# Patient Record
Sex: Female | Born: 1990 | Marital: Single | Smoking: Never smoker
Health system: Southern US, Community
[De-identification: ages and names within clinical notes are randomized; demographics above are authoritative.]

## PROBLEM LIST (undated history)

## (undated) ENCOUNTER — Inpatient Hospital Stay (HOSPITAL_COMMUNITY): Payer: Self-pay

## (undated) ENCOUNTER — Emergency Department (HOSPITAL_COMMUNITY): Admission: EM | Payer: Medicaid Other

## (undated) DIAGNOSIS — Z789 Other specified health status: Secondary | ICD-10-CM

## (undated) HISTORY — PX: NO PAST SURGERIES: SHX2092

---
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
## 2009-01-22 ENCOUNTER — Emergency Department (HOSPITAL_COMMUNITY): Admission: EM | Admit: 2009-01-22 | Discharge: 2009-01-22 | Payer: Self-pay | Admitting: Emergency Medicine

## 2009-07-24 ENCOUNTER — Emergency Department (HOSPITAL_COMMUNITY): Admission: EM | Admit: 2009-07-24 | Discharge: 2009-07-24 | Payer: Self-pay | Admitting: Emergency Medicine

## 2009-09-04 ENCOUNTER — Emergency Department (HOSPITAL_COMMUNITY): Admission: EM | Admit: 2009-09-04 | Discharge: 2009-09-04 | Payer: Self-pay | Admitting: Emergency Medicine

## 2009-11-11 ENCOUNTER — Ambulatory Visit (HOSPITAL_COMMUNITY): Admission: RE | Admit: 2009-11-11 | Discharge: 2009-11-11 | Payer: Self-pay | Admitting: Obstetrics

## 2010-01-28 ENCOUNTER — Inpatient Hospital Stay (HOSPITAL_COMMUNITY): Admission: AD | Admit: 2010-01-28 | Discharge: 2010-01-28 | Payer: Self-pay | Admitting: Obstetrics & Gynecology

## 2010-03-15 ENCOUNTER — Inpatient Hospital Stay (HOSPITAL_COMMUNITY): Admission: AD | Admit: 2010-03-15 | Discharge: 2010-03-15 | Payer: Self-pay | Admitting: Obstetrics

## 2010-03-17 ENCOUNTER — Inpatient Hospital Stay (HOSPITAL_COMMUNITY): Admission: AD | Admit: 2010-03-17 | Discharge: 2010-03-19 | Payer: Self-pay | Admitting: Obstetrics

## 2010-10-10 ENCOUNTER — Encounter: Payer: Self-pay | Admitting: Obstetrics

## 2010-12-05 LAB — CBC
HCT: 32.9 % — ABNORMAL LOW (ref 36.0–46.0)
Hemoglobin: 10.9 g/dL — ABNORMAL LOW (ref 12.0–15.0)
MCH: 28.7 pg (ref 26.0–34.0)
MCHC: 33 g/dL (ref 30.0–36.0)
MCHC: 34 g/dL (ref 30.0–36.0)
RDW: 13.5 % (ref 11.5–15.5)
RDW: 13.6 % (ref 11.5–15.5)

## 2010-12-07 LAB — CBC
MCHC: 34.2 g/dL (ref 30.0–36.0)
MCV: 86.2 fL (ref 78.0–100.0)
RBC: 3.59 MIL/uL — ABNORMAL LOW (ref 3.87–5.11)
RDW: 12.9 % (ref 11.5–15.5)

## 2010-12-07 LAB — URINALYSIS, ROUTINE W REFLEX MICROSCOPIC
Ketones, ur: NEGATIVE mg/dL
Nitrite: NEGATIVE
Specific Gravity, Urine: 1.015 (ref 1.005–1.030)
pH: 6.5 (ref 5.0–8.0)

## 2010-12-07 LAB — WET PREP, GENITAL
Clue Cells Wet Prep HPF POC: NONE SEEN
Yeast Wet Prep HPF POC: NONE SEEN

## 2010-12-07 LAB — GC/CHLAMYDIA PROBE AMP, GENITAL: GC Probe Amp, Genital: NEGATIVE

## 2010-12-22 LAB — URINE MICROSCOPIC-ADD ON

## 2010-12-22 LAB — WET PREP, GENITAL

## 2010-12-22 LAB — URINALYSIS, ROUTINE W REFLEX MICROSCOPIC
Ketones, ur: NEGATIVE mg/dL
Nitrite: POSITIVE — AB
Specific Gravity, Urine: 1.035 — ABNORMAL HIGH (ref 1.005–1.030)
Urobilinogen, UA: 1 mg/dL (ref 0.0–1.0)

## 2010-12-28 LAB — POCT URINALYSIS DIP (DEVICE)
Glucose, UA: NEGATIVE mg/dL
Ketones, ur: NEGATIVE mg/dL
Nitrite: NEGATIVE
Protein, ur: 30 mg/dL — AB
Specific Gravity, Urine: >=1.03 (ref 1.005–1.030)

## 2010-12-28 LAB — POCT PREGNANCY, URINE: Preg Test, Ur: NEGATIVE

## 2010-12-28 LAB — WET PREP, GENITAL: Trich, Wet Prep: NONE SEEN

## 2010-12-28 LAB — GC/CHLAMYDIA PROBE AMP, GENITAL: Chlamydia, DNA Probe: POSITIVE — AB

## 2010-12-30 LAB — GC/CHLAMYDIA PROBE AMP, GENITAL
Chlamydia, DNA Probe: POSITIVE — AB
GC Probe Amp, Genital: NEGATIVE

## 2011-01-02 ENCOUNTER — Emergency Department (HOSPITAL_COMMUNITY): Payer: Medicaid Other

## 2011-01-02 ENCOUNTER — Emergency Department (HOSPITAL_COMMUNITY)
Admission: EM | Admit: 2011-01-02 | Discharge: 2011-01-02 | Disposition: A | Discharge status: Home / Self Care | Payer: Medicaid Other | Attending: Emergency Medicine | Admitting: Emergency Medicine

## 2011-01-02 DIAGNOSIS — Y9229 Other specified public building as the place of occurrence of the external cause: Secondary | ICD-10-CM | POA: Insufficient documentation

## 2011-01-02 DIAGNOSIS — R55 Syncope and collapse: Secondary | ICD-10-CM | POA: Insufficient documentation

## 2011-01-02 DIAGNOSIS — M542 Cervicalgia: Secondary | ICD-10-CM | POA: Insufficient documentation

## 2011-01-03 LAB — POCT I-STAT, CHEM 8
Calcium, Ion: 1.27 mmol/L (ref 1.12–1.32)
Creatinine, Ser: 0.4 mg/dL (ref 0.4–1.2)
Glucose, Bld: 73 mg/dL (ref 70–99)
HCT: 36 % (ref 36.0–46.0)
Hemoglobin: 12.2 g/dL (ref 12.0–15.0)
Potassium: 4.3 mEq/L (ref 3.5–5.1)
TCO2: 25 mmol/L (ref 0–100)

## 2011-01-03 LAB — URINALYSIS, ROUTINE W REFLEX MICROSCOPIC
Bilirubin Urine: NEGATIVE
Hgb urine dipstick: NEGATIVE
Ketones, ur: NEGATIVE mg/dL
Nitrite: NEGATIVE
pH: 6 (ref 5.0–8.0)

## 2011-01-03 LAB — URINE CULTURE: Colony Count: 55000

## 2011-01-03 LAB — WET PREP, GENITAL
WBC, Wet Prep HPF POC: NONE SEEN
Yeast Wet Prep HPF POC: NONE SEEN

## 2011-01-03 LAB — CBC
HCT: 34.1 % — ABNORMAL LOW (ref 36.0–46.0)
Hemoglobin: 11.5 g/dL — ABNORMAL LOW (ref 12.0–15.0)
MCHC: 33.7 g/dL (ref 30.0–36.0)
RBC: 4.05 MIL/uL (ref 3.87–5.11)
RDW: 12.5 % (ref 11.5–15.5)

## 2011-01-03 LAB — DIFFERENTIAL
Basophils Absolute: 0 10*3/uL (ref 0.0–0.1)
Basophils Relative: 0 % (ref 0–1)
Eosinophils Relative: 2 % (ref 0–5)
Lymphocytes Relative: 40 % (ref 12–46)
Monocytes Absolute: 1.1 10*3/uL — ABNORMAL HIGH (ref 0.1–1.0)
Monocytes Relative: 14 % — ABNORMAL HIGH (ref 3–12)

## 2011-01-03 LAB — GC/CHLAMYDIA PROBE AMP, GENITAL
Chlamydia, DNA Probe: POSITIVE — AB
GC Probe Amp, Genital: NEGATIVE

## 2011-01-03 LAB — TYPE AND SCREEN
ABO/RH(D): O POS
Antibody Screen: NEGATIVE

## 2011-01-03 LAB — URINE MICROSCOPIC-ADD ON

## 2012-03-03 ENCOUNTER — Encounter (HOSPITAL_COMMUNITY): Payer: Self-pay | Admitting: Emergency Medicine

## 2012-03-03 ENCOUNTER — Emergency Department (HOSPITAL_COMMUNITY)
Admission: EM | Admit: 2012-03-03 | Discharge: 2012-03-03 | Disposition: A | Discharge status: Home / Self Care | Payer: BC Managed Care – PPO | Attending: Emergency Medicine | Admitting: Emergency Medicine

## 2012-03-03 DIAGNOSIS — Z880 Allergy status to penicillin: Secondary | ICD-10-CM | POA: Insufficient documentation

## 2012-03-03 DIAGNOSIS — R102 Pelvic and perineal pain: Secondary | ICD-10-CM

## 2012-03-03 DIAGNOSIS — N760 Acute vaginitis: Secondary | ICD-10-CM | POA: Insufficient documentation

## 2012-03-03 DIAGNOSIS — B9689 Other specified bacterial agents as the cause of diseases classified elsewhere: Secondary | ICD-10-CM | POA: Insufficient documentation

## 2012-03-03 DIAGNOSIS — A499 Bacterial infection, unspecified: Secondary | ICD-10-CM | POA: Insufficient documentation

## 2012-03-03 LAB — DIFFERENTIAL
Basophils Absolute: 0 10*3/uL (ref 0.0–0.1)
Eosinophils Absolute: 0.1 10*3/uL (ref 0.0–0.7)
Eosinophils Relative: 2 % (ref 0–5)
Lymphocytes Relative: 56 % — ABNORMAL HIGH (ref 12–46)
Lymphs Abs: 4.2 10*3/uL — ABNORMAL HIGH (ref 0.7–4.0)
Monocytes Absolute: 0.8 10*3/uL (ref 0.1–1.0)

## 2012-03-03 LAB — BASIC METABOLIC PANEL
CO2: 27 mEq/L (ref 19–32)
Calcium: 9.6 mg/dL (ref 8.4–10.5)
Creatinine, Ser: 0.68 mg/dL (ref 0.50–1.10)
GFR calc non Af Amer: >90 mL/min (ref >90)
Glucose, Bld: 94 mg/dL (ref 70–99)
Sodium: 138 mEq/L (ref 135–145)

## 2012-03-03 LAB — URINALYSIS, ROUTINE W REFLEX MICROSCOPIC
Bilirubin Urine: NEGATIVE
Glucose, UA: NEGATIVE mg/dL
Hgb urine dipstick: NEGATIVE
Ketones, ur: NEGATIVE mg/dL
Protein, ur: NEGATIVE mg/dL
Urobilinogen, UA: 1 mg/dL (ref 0.0–1.0)

## 2012-03-03 LAB — WET PREP, GENITAL

## 2012-03-03 LAB — CBC
HCT: 35.3 % — ABNORMAL LOW (ref 36.0–46.0)
MCH: 27.5 pg (ref 26.0–34.0)
MCV: 85.1 fL (ref 78.0–100.0)
Platelets: 291 10*3/uL (ref 150–400)
RDW: 12.2 % (ref 11.5–15.5)
WBC: 7.5 10*3/uL (ref 4.0–10.5)

## 2012-03-03 MED ORDER — TRAMADOL HCL 50 MG PO TABS: 50.0000 mg | ORAL_TABLET | Freq: Four times a day (QID) | ORAL | Status: AC | PRN

## 2012-03-03 MED ORDER — METRONIDAZOLE 500 MG PO TABS: 500.0000 mg | ORAL_TABLET | Freq: Two times a day (BID) | ORAL | Status: AC

## 2012-03-03 NOTE — Discharge Instructions (Signed)
Bacterial Vaginosis Bacterial vaginosis (BV) is a vaginal infection where the normal balance of bacteria in the vagina is disrupted. The normal balance is then replaced by an overgrowth of certain bacteria. There are several different kinds of bacteria that can cause BV. BV is the most common vaginal infection in women of childbearing age. CAUSES   The cause of BV is not fully understood. BV develops when there is an increase or imbalance of harmful bacteria.   Some activities or behaviors can upset the normal balance of bacteria in the vagina and put women at increased risk including:   Having a new sex partner or multiple sex partners.   Douching.   Using an intrauterine device (IUD) for contraception.   It is not clear what role sexual activity plays in the development of BV. However, women that have never had sexual intercourse are rarely infected with BV.  Women do not get BV from toilet seats, bedding, swimming pools or from touching objects around them.  SYMPTOMS   Grey vaginal discharge.   A fish-like odor with discharge, especially after sexual intercourse.   Itching or burning of the vagina and vulva.   Burning or pain with urination.   Some women have no signs or symptoms at all.  DIAGNOSIS  Your caregiver must examine the vagina for signs of BV. Your caregiver will perform lab tests and look at the sample of vaginal fluid through a microscope. They will look for bacteria and abnormal cells (clue cells), a pH test higher than 4.5, and a positive amine test all associated with BV.  RISKS AND COMPLICATIONS   Pelvic inflammatory disease (PID).   Infections following gynecology surgery.   Developing HIV.   Developing herpes virus.  TREATMENT  Sometimes BV will clear up without treatment. However, all women with symptoms of BV should be treated to avoid complications, especially if gynecology surgery is planned. Female partners generally do not need to be treated. However,  BV may spread between female sex partners so treatment is helpful in preventing a recurrence of BV.   BV may be treated with antibiotics. The antibiotics come in either pill or vaginal cream forms. Either can be used with nonpregnant or pregnant women, but the recommended dosages differ. These antibiotics are not harmful to the baby.   BV can recur after treatment. If this happens, a second round of antibiotics will often be prescribed.   Treatment is important for pregnant women. If not treated, BV can cause a premature delivery, especially for a pregnant woman who had a premature birth in the past. All pregnant women who have symptoms of BV should be checked and treated.   For chronic reoccurrence of BV, treatment with a type of prescribed gel vaginally twice a week is helpful.  HOME CARE INSTRUCTIONS   Finish all medication as directed by your caregiver.   Do not have sex until treatment is completed.   Tell your sexual partner that you have a vaginal infection. They should see their caregiver and be treated if they have problems, such as a mild rash or itching.   Practice safe sex. Use condoms. Only have 1 sex partner.  PREVENTION  Basic prevention steps can help reduce the risk of upsetting the natural balance of bacteria in the vagina and developing BV:  Do not have sexual intercourse (be abstinent).   Do not douche.   Use all of the medicine prescribed for treatment of BV, even if the signs and symptoms go away.     Tell your sex partner if you have BV. That way, they can be treated, if needed, to prevent reoccurrence.  SEEK MEDICAL CARE IF:   Your symptoms are not improving after 3 days of treatment.   You have increased discharge, pain, or fever.  MAKE SURE YOU:   Understand these instructions.   Will watch your condition.   Will get help right away if you are not doing well or get worse.  FOR MORE INFORMATION  Division of STD Prevention (DSTDP), Centers for Disease  Control and Prevention: www.cdc.gov/std American Social Health Association (ASHA): www.ashastd.org  Document Released: 09/05/2005 Document Revised: 08/25/2011 Document Reviewed: 02/26/2009 Exit[employer] Patient Information 2012 [employer]. 

## 2012-03-03 NOTE — ED Provider Notes (Signed)
History     CSN: [id number]  Arrival date & time 03/03/12  0125   First MD Initiated Contact with Patient 03/03/12 0348      Chief Complaint  Patient presents with  . Abdominal Pain    (Consider location/radiation/quality/duration/timing/severity/associated sxs/prior treatment) HPI Comments: The patient states she was having sex with her significant other when she started with discomfort in the lower abdomen, like "something was coming out of her".  She denies vaginal bleeding or discharge.  No fevers or chills.  Never happened before.  No urinary complaints.  The history is provided by the patient.    History reviewed. No pertinent past medical history.  History reviewed. No pertinent past surgical history.  No family history on file.  History  Substance Use Topics  . Smoking status: Never Smoker   . Smokeless tobacco: Not on file  . Alcohol Use: No    OB History    Grav Para Term Preterm Abortions TAB SAB Ect Mult Living                  Review of Systems  All other systems reviewed and are negative.    Allergies  Penicillins  Home Medications  No current outpatient prescriptions on file.  BP 114/45  Pulse 64  Temp 98 F (36.7 C) (Oral)  Resp 18  SpO2 98%  LMP 02/19/2012  Physical Exam  Nursing note and vitals reviewed. Constitutional: She is oriented to person, place, and time. She appears well-developed and well-nourished. No distress.  HENT:  Head: Normocephalic and atraumatic.  Neck: Normal range of motion. Neck supple.  Cardiovascular: Normal rate and regular rhythm.  Exam reveals no gallop and no friction rub.   No murmur heard. Pulmonary/Chest: Effort normal and breath sounds normal. No respiratory distress. She has no wheezes.  Abdominal: Soft. Bowel sounds are normal. She exhibits no distension. There is no tenderness.  Genitourinary: Uterus normal. Vaginal discharge found.       Slight gray vaginal discharge.  Musculoskeletal: Normal  range of motion.  Neurological: She is alert and oriented to person, place, and time.  Skin: Skin is warm and dry. She is not diaphoretic.    ED Course  Procedures (including critical care time)  Labs Reviewed  URINALYSIS, ROUTINE W REFLEX MICROSCOPIC - Abnormal; Notable for the following:    APPearance CLOUDY (*)     Leukocytes, UA SMALL (*)     All other components within normal limits  CBC - Abnormal; Notable for the following:    Hemoglobin 11.4 (*)     HCT 35.3 (*)     All other components within normal limits  DIFFERENTIAL - Abnormal; Notable for the following:    Neutrophils Relative 31 (*)     Lymphocytes Relative 56 (*)     Lymphs Abs 4.2 (*)     All other components within normal limits  URINE MICROSCOPIC-ADD ON - Abnormal; Notable for the following:    Squamous Epithelial / LPF FEW (*)     Bacteria, UA MANY (*)     All other components within normal limits  BASIC METABOLIC PANEL  POCT PREGNANCY, URINE   No results found.   No diagnosis found.    MDM  Wet prep shows many clue cells.  Will treat as BV with pain meds, flagyl.  No evidence of trauma in the vagina.          Geoffery Lyons, MD 03/03/12 [phone]

## 2012-03-03 NOTE — ED Notes (Signed)
PT. REPORTS RIGHT LOWER ABDOMINAL PAIN ONSET YESTERDAY WHILE HAVING SEXUAL INTERCOURSE WITH PARTNER , DENIES NAUSEA, VOMITTING OR DIARRHEA . NO FEVER /CHILLS OR VAGINAL DISCHARGE.

## 2012-05-21 IMAGING — CR DG NECK SOFT TISSUE
1 series · 1 of 1 positions shown · non-contrast
Comparison: None

CLINICAL DATA: Sore throat and pain while swallowing

NECK SOFT TISSUES - 1+ VIEW

[w soft tissue neck]
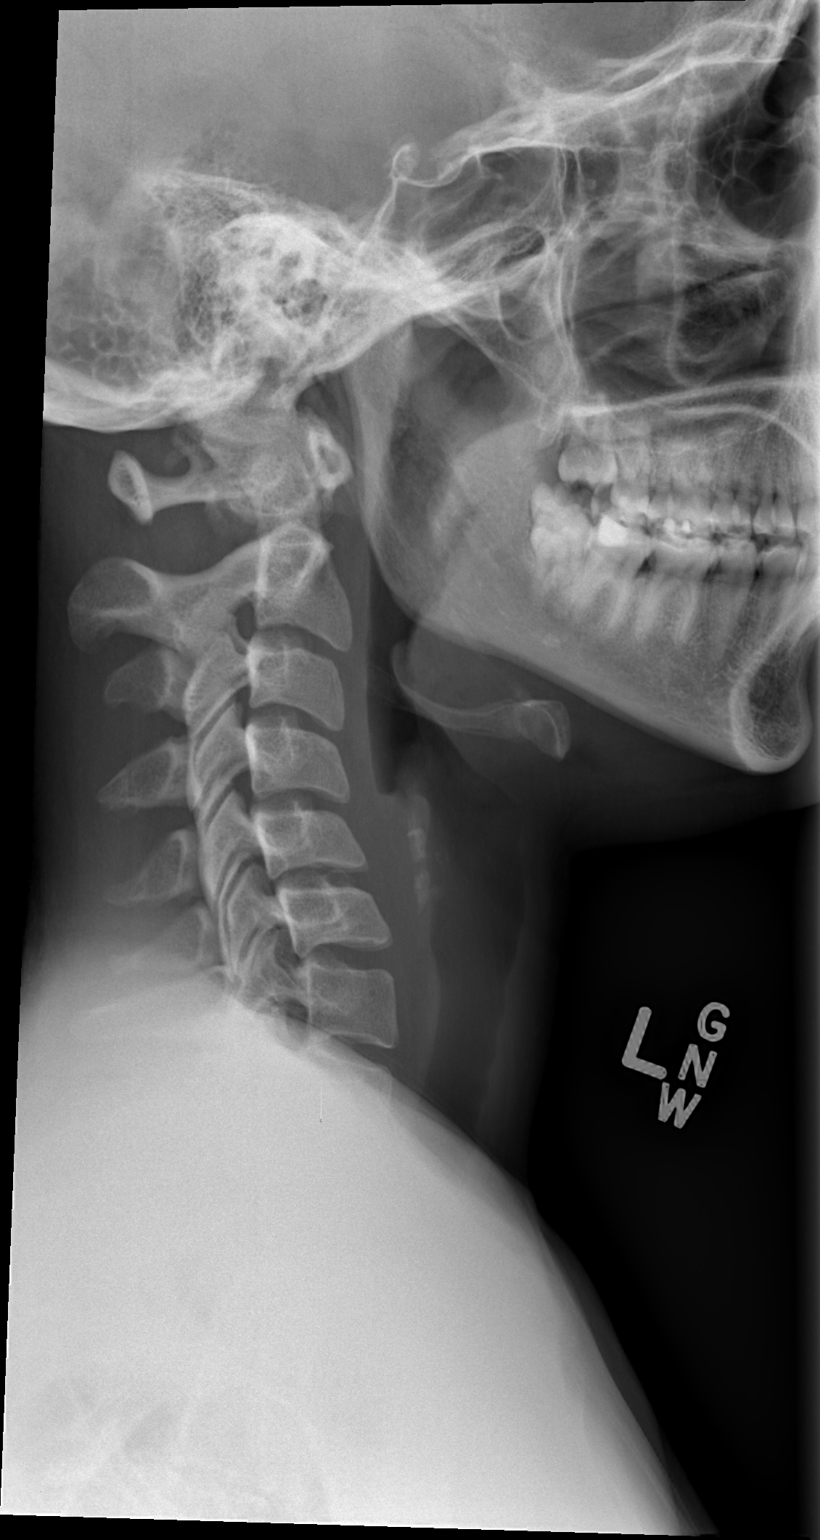

[1 of 1 positions shown; findings below may reference images not displayed]

FINDINGS: Prevertebral soft tissue space is normal.

No soft tissue abnormalities noted.

No radio-opaque foreign bodies identified.
IMPRESSION: 1.  Negative exam.

## 2012-10-16 ENCOUNTER — Encounter (HOSPITAL_COMMUNITY): Payer: Self-pay | Admitting: Adult Health

## 2012-10-16 ENCOUNTER — Emergency Department (HOSPITAL_COMMUNITY): Payer: BC Managed Care – PPO

## 2012-10-16 ENCOUNTER — Emergency Department (HOSPITAL_COMMUNITY)
Admission: EM | Admit: 2012-10-16 | Discharge: 2012-10-16 | Disposition: A | Discharge status: Home / Self Care | Payer: BC Managed Care – PPO | Attending: Emergency Medicine | Admitting: Emergency Medicine

## 2012-10-16 DIAGNOSIS — Z3202 Encounter for pregnancy test, result negative: Secondary | ICD-10-CM | POA: Insufficient documentation

## 2012-10-16 DIAGNOSIS — R52 Pain, unspecified: Secondary | ICD-10-CM | POA: Insufficient documentation

## 2012-10-16 DIAGNOSIS — R05 Cough: Secondary | ICD-10-CM

## 2012-10-16 DIAGNOSIS — N898 Other specified noninflammatory disorders of vagina: Secondary | ICD-10-CM | POA: Insufficient documentation

## 2012-10-16 DIAGNOSIS — J029 Acute pharyngitis, unspecified: Secondary | ICD-10-CM | POA: Insufficient documentation

## 2012-10-16 DIAGNOSIS — J069 Acute upper respiratory infection, unspecified: Secondary | ICD-10-CM | POA: Insufficient documentation

## 2012-10-16 DIAGNOSIS — R059 Cough, unspecified: Secondary | ICD-10-CM

## 2012-10-16 LAB — URINALYSIS, ROUTINE W REFLEX MICROSCOPIC
Bilirubin Urine: NEGATIVE
Glucose, UA: NEGATIVE mg/dL
Hgb urine dipstick: NEGATIVE
Ketones, ur: NEGATIVE mg/dL
Leukocytes, UA: NEGATIVE
Nitrite: NEGATIVE
Protein, ur: NEGATIVE mg/dL
Specific Gravity, Urine: 1.022 (ref 1.005–1.030)
Urobilinogen, UA: 2 mg/dL — ABNORMAL HIGH (ref 0.0–1.0)
pH: 6 (ref 5.0–8.0)

## 2012-10-16 LAB — WET PREP, GENITAL
Trich, Wet Prep: NONE SEEN
Yeast Wet Prep HPF POC: NONE SEEN

## 2012-10-16 LAB — POCT PREGNANCY, URINE: Preg Test, Ur: NEGATIVE

## 2012-10-16 MED ORDER — HYDROCOD POLST-CHLORPHEN POLST 10-8 MG/5ML PO LQCR
5.0000 mL | Freq: Once | ORAL | Status: AC
  Administered 2012-10-16: 5 mL via ORAL
  Filled 2012-10-16: qty 5

## 2012-10-16 MED ORDER — HYDROCOD POLST-CHLORPHEN POLST 10-8 MG/5ML PO LQCR: 5.0000 mL | Freq: Two times a day (BID) | ORAL | Status: DC | PRN

## 2012-10-16 NOTE — ED Notes (Signed)
Patient currently sitting up in bed; no respiratory or acute distress noted.  Patient updated on plan of care; informed patient that we are currently waiting on EDP to come and perform pelvic exam; patient denies needs at this time; will continue to monitor.

## 2012-10-16 NOTE — ED Notes (Signed)
Patient given discharge paperwork; went over discharge instructions with patient.  Patient instructed to take prescriptions as directed and to not drive while taking narcotics.  Patient instructed to follow up with primary care physician and to return to the ED for new, worsening, or concerning symptoms.

## 2012-10-16 NOTE — ED Notes (Addendum)
Patient currently resting quietly in bed; no respiratory or acute distress noted.  Patient updated on plan of care; informed patient that we are currently waiting on EDP to perform pelvic exam; patient given warm blankets.  Patient denies any other needs at this time; will continue to monitor.

## 2012-10-16 NOTE — ED Notes (Signed)
Pt presents with 2 weeks of cough and 2 days of generalized body aches and sore throat. She also would like a an STD check due to discharge and foul vaginal odor for one week.

## 2012-10-16 NOTE — ED Notes (Signed)
Pelvic cart set up at bedside  

## 2012-10-16 NOTE — ED Provider Notes (Signed)
History     CSN: [id number]  Arrival date & time 10/16/12  0028   First MD Initiated Contact with Patient 10/16/12 [phone]      Chief Complaint  Patient presents with  . Cough    (Consider location/radiation/quality/duration/timing/severity/associated sxs/prior treatment) HPI 22 year old female presents to emergency department complaining of 2 weeks of persistent cough, 2 days of sore throat and generalized body aches. Patient has not taken any over-the-counter medications. She's had no fevers. She's not a smoker. She has no sick contacts. Patient also complaining of 2 weeks of vaginal discharge with odor. Patient usually uses condoms, but not every time. She denies any new sexual partners. She denies any pelvic pain. No urinary symptoms. Her last menstrual cycle was 2 weeks ago.  History reviewed. No pertinent past medical history.  History reviewed. No pertinent past surgical history.  History reviewed. No pertinent family history.  History  Substance Use Topics  . Smoking status: Never Smoker   . Smokeless tobacco: Not on file  . Alcohol Use: No    OB History    Grav Para Term Preterm Abortions TAB SAB Ect Mult Living                  Review of Systems  See History of Present Illness; otherwise all other systems are reviewed and negative Allergies  Penicillins  Home Medications   Current Outpatient Rx  Name  Route  Sig  Dispense  Refill  . HYDROCOD POLST-CPM POLST ER 10-8 MG/5ML PO LQCR   Oral   Take 5 mLs by mouth every 12 (twelve) hours as needed (cough).   140 mL   0     BP 111/69  Pulse 92  Temp 98 F (36.7 C) (Oral)  Resp 20  SpO2 100%  LMP 09/26/2012  Physical Exam  Nursing note and vitals reviewed. Constitutional: She is oriented to person, place, and time. She appears well-developed and well-nourished.  HENT:  Head: Normocephalic and atraumatic.  Right Ear: External ear normal.  Left Ear: External ear normal.  Mouth/Throat: Oropharynx is  clear and moist.       Posterior pharynx erythematous but no exudate. If those are not enlarged. She has rhinorrhea. She has dry cough noted  Eyes: Conjunctivae normal and EOM are normal. Pupils are equal, round, and reactive to light.  Neck: Normal range of motion. Neck supple. No JVD present. No tracheal deviation present. No thyromegaly present.  Cardiovascular: Normal rate, regular rhythm, normal heart sounds and intact distal pulses.  Exam reveals no gallop and no friction rub.   No murmur heard. Pulmonary/Chest: Effort normal and breath sounds normal. No stridor. No respiratory distress. She has no wheezes. She has no rales. She exhibits no tenderness.       Cough  Abdominal: Soft. Bowel sounds are normal. She exhibits no distension and no mass. There is no tenderness. There is no rebound and no guarding.  Genitourinary:       External genitalia normal Vagina with scant white/clear discharge Cervix closed  no lesions No cervical motion tenderness Adnexa palpated, no masses or tenderness noted Bladder palpated no tenderness Uterus palpated no masses or tenderness    Musculoskeletal: Normal range of motion. She exhibits no edema and no tenderness.  Lymphadenopathy:    She has no cervical adenopathy.  Neurological: She is oriented to person, place, and time. She has normal reflexes. No cranial nerve deficit. She exhibits normal muscle tone. Coordination normal.  Skin: Skin is warm and  dry. No rash noted. No erythema. No pallor.  Psychiatric: She has a normal mood and affect. Her behavior is normal. Judgment and thought content normal.    ED Course  Procedures (including critical care time)  Labs Reviewed  URINALYSIS, ROUTINE W REFLEX MICROSCOPIC - Abnormal; Notable for the following:    APPearance HAZY (*)     Urobilinogen, UA 2.0 (*)     All other components within normal limits  WET PREP, GENITAL - Abnormal; Notable for the following:    Clue Cells Wet Prep HPF POC FEW (*)       WBC, Wet Prep HPF POC FEW (*)     All other components within normal limits  POCT PREGNANCY, URINE  GC/CHLAMYDIA PROBE AMP   Dg Chest 2 View  10/16/2012  *RADIOLOGY REPORT*  Clinical Data: Cough, chest pain.  CHEST - 2 VIEW  Comparison: None.  Findings: Lungs are clear. No pleural effusion or pneumothorax. The cardiomediastinal contours are within normal limits. The visualized bones and soft tissues are without significant appreciable abnormality.  IMPRESSION: No radiographic evidence of acute cardiopulmonary process.   Original Report Authenticated By: Jearld Lesch, M.D.      1. Cough   2. Upper respiratory infection       MDM   22 year old female with URI symptoms as well as vaginal discharge. GC and Chlamydia sent off, but she has no signs of PID, and no overt discharge. No treatment for STDs at this time. Will await lab samples. Supportive care for her URI symptoms.        Olivia Mackie, MD 10/16/12 [phone]

## 2012-10-16 NOTE — ED Notes (Signed)
Patient currently resting quietly in bed; no respiratory or acute distress noted.  Patient updated on plan of care; informed patient that we are currently waiting on discharge paperwork from EDP.  Patient denies any other needs at this time; will continue to monitor.

## 2012-10-16 NOTE — ED Notes (Signed)
Patient complaining of cough that started two days ago; cough progressively became worse two to three days ago, along with additional symptoms beginning -- sore throat, chills, generalized body aches, and head pressure.  Patient requesitng pelvic exam; complaining of foul smelling abnormal vaginal discharge that has been going on for one week.  Patient alert and oriented x4; PERRL present.  Upon arrival to room, patient given mask to wear; placed on droplet precautions.  Will continue to monitor.

## 2013-01-23 ENCOUNTER — Telehealth: Payer: Self-pay | Admitting: *Deleted

## 2013-01-23 MED ORDER — METRONIDAZOLE 500 MG PO TABS: 500.0000 mg | ORAL_TABLET | Freq: Two times a day (BID) | ORAL | Status: DC

## 2013-01-24 NOTE — Telephone Encounter (Signed)
Patient calling regarding abnormal discharge with an odor for one week.  Metronidazole called to her pharmacy - [facility]/ [address] and [address].

## 2013-03-07 ENCOUNTER — Telehealth: Payer: Self-pay | Admitting: *Deleted

## 2013-03-07 NOTE — Telephone Encounter (Signed)
Pt left message requesting refill on Ibuprofen 800 mg.   I called pt and left message to return call to office.

## 2013-03-18 ENCOUNTER — Telehealth: Payer: Self-pay | Admitting: *Deleted

## 2013-03-18 NOTE — Telephone Encounter (Signed)
Pt left message requesting refill on Ibuprofen.   5:55 PM Spoke with pt, who advised only takes Ibuprofen 800 mg for menstrual cramping. Pt is requesting refill on same.

## 2013-03-19 ENCOUNTER — Ambulatory Visit: Payer: Self-pay | Admitting: Obstetrics

## 2013-03-19 MED ORDER — IBUPROFEN 800 MG PO TABS: 800.0000 mg | ORAL_TABLET | Freq: Three times a day (TID) | ORAL | Status: DC | PRN

## 2013-03-19 NOTE — Telephone Encounter (Signed)
Per Dr. Clearance Coots- Ibuprofen 800 mg every 8 hours as needed, #30 x 1 year. Sent prescription electronically and pt aware.

## 2013-04-18 ENCOUNTER — Other Ambulatory Visit: Payer: Self-pay | Admitting: *Deleted

## 2013-04-18 MED ORDER — FLUCONAZOLE 150 MG PO TABS: 150.0000 mg | ORAL_TABLET | Freq: Once | ORAL | Status: DC

## 2013-04-30 ENCOUNTER — Encounter: Payer: Self-pay | Admitting: Obstetrics

## 2013-07-03 ENCOUNTER — Ambulatory Visit (INDEPENDENT_AMBULATORY_CARE_PROVIDER_SITE_OTHER): Payer: BC Managed Care – PPO | Admitting: Obstetrics

## 2013-07-03 ENCOUNTER — Encounter: Payer: Self-pay | Admitting: Obstetrics

## 2013-07-03 VITALS — BP 123/71 | HR 62 | Temp 98.9°F | Ht 65.0 in | Wt 132.2 lb

## 2013-07-03 DIAGNOSIS — Z01419 Encounter for gynecological examination (general) (routine) without abnormal findings: Secondary | ICD-10-CM

## 2013-07-03 DIAGNOSIS — Z113 Encounter for screening for infections with a predominantly sexual mode of transmission: Secondary | ICD-10-CM

## 2013-07-03 DIAGNOSIS — N760 Acute vaginitis: Secondary | ICD-10-CM

## 2013-07-03 DIAGNOSIS — Z0000 Encounter for general adult medical examination without abnormal findings: Secondary | ICD-10-CM

## 2013-07-03 LAB — SYPHILIS: RPR W/REFLEX TO RPR TITER AND TREPONEMAL ANTIBODIES, TRADITIONAL SCREENING AND DIAGNOSIS ALGORITHM

## 2013-07-03 LAB — HIV ANTIBODY (ROUTINE TESTING W REFLEX): HIV: NONREACTIVE

## 2013-07-03 LAB — HEPATITIS C ANTIBODY: HCV Ab: NEGATIVE

## 2013-07-03 NOTE — Progress Notes (Signed)
.   Subjective:     Tammie Martinez is a 22 y.o. female here for a routine exam.  Current complaints: Pt would also like to be tested for all StD's.  Personal health questionnaire reviewed: yes.   Gynecologic History Patient's last menstrual period was 05/31/2013. Contraception: none Last Pap: 2011. Results were: normal Last mammogram: N/A  Obstetric History OB History  No data available     The following portions of the patient's history were reviewed and updated as appropriate: allergies, current medications, past family history, past medical history, past social history, past surgical history and problem list.  Review of Systems Pertinent items are noted in HPI.    Objective:    General appearance: alert and no distress Abdomen: normal findings: soft, non-tender Pelvic: cervix normal in appearance, external genitalia normal, no adnexal masses or tenderness, no cervical motion tenderness, uterus normal size, shape, and consistency and vagina normal without discharge     Assessment:    Healthy female exam.    Plan:    Education reviewed: safe sex/STD prevention and contraceptive options. Contraception: none. Follow up in: several months.

## 2013-07-04 LAB — WET PREP BY MOLECULAR PROBE
Candida species: NEGATIVE
Gardnerella vaginalis: POSITIVE — AB
Trichomonas vaginosis: NEGATIVE

## 2013-07-04 LAB — PAP IG W/ RFLX HPV ASCU

## 2013-07-12 ENCOUNTER — Telehealth: Payer: Self-pay | Admitting: *Deleted

## 2013-07-12 MED ORDER — METRONIDAZOLE 500 MG PO TABS: 500.0000 mg | ORAL_TABLET | Freq: Two times a day (BID) | ORAL | Status: DC

## 2013-07-12 NOTE — Telephone Encounter (Signed)
Message copied by Glendell Docker on Fri Jul 12, 2013 12:49 PM ------      Message from: Coral Ceo A      Created: Thu Jul 04, 2013  6:57 AM       Flagyl 500mg  po bid x 7 days. ------

## 2013-07-12 NOTE — Telephone Encounter (Signed)
Call placed to patient she was informed of lab results and medication per Dr Coral Ceo instructions. Patient has verbalized understanding and agrees as instructed. Rx sent to pharmacy.

## 2013-07-25 ENCOUNTER — Other Ambulatory Visit: Payer: Self-pay | Admitting: *Deleted

## 2013-07-25 DIAGNOSIS — B379 Candidiasis, unspecified: Secondary | ICD-10-CM

## 2013-07-25 MED ORDER — FLUCONAZOLE 150 MG PO TABS: 150.0000 mg | ORAL_TABLET | Freq: Once | ORAL | Status: DC

## 2013-07-31 ENCOUNTER — Encounter: Payer: Self-pay | Admitting: Obstetrics

## 2013-07-31 ENCOUNTER — Emergency Department (HOSPITAL_COMMUNITY)
Admission: EM | Admit: 2013-07-31 | Discharge: 2013-07-31 | Disposition: A | Discharge status: Home / Self Care | Payer: BC Managed Care – PPO | Attending: Emergency Medicine | Admitting: Emergency Medicine

## 2013-07-31 ENCOUNTER — Encounter (HOSPITAL_COMMUNITY): Payer: Self-pay | Admitting: Emergency Medicine

## 2013-07-31 DIAGNOSIS — M545 Low back pain, unspecified: Secondary | ICD-10-CM

## 2013-07-31 DIAGNOSIS — Y9389 Activity, other specified: Secondary | ICD-10-CM | POA: Insufficient documentation

## 2013-07-31 DIAGNOSIS — S3981XA Other specified injuries of abdomen, initial encounter: Secondary | ICD-10-CM | POA: Insufficient documentation

## 2013-07-31 DIAGNOSIS — IMO0002 Reserved for concepts with insufficient information to code with codable children: Secondary | ICD-10-CM | POA: Insufficient documentation

## 2013-07-31 DIAGNOSIS — Y92410 Unspecified street and highway as the place of occurrence of the external cause: Secondary | ICD-10-CM | POA: Insufficient documentation

## 2013-07-31 DIAGNOSIS — Z880 Allergy status to penicillin: Secondary | ICD-10-CM | POA: Insufficient documentation

## 2013-07-31 DIAGNOSIS — R109 Unspecified abdominal pain: Secondary | ICD-10-CM

## 2013-07-31 DIAGNOSIS — S0993XA Unspecified injury of face, initial encounter: Secondary | ICD-10-CM | POA: Insufficient documentation

## 2013-07-31 MED ORDER — TRAMADOL HCL 50 MG PO TABS: 50.0000 mg | ORAL_TABLET | Freq: Four times a day (QID) | ORAL | Status: DC | PRN

## 2013-07-31 MED ORDER — HYDROCODONE-ACETAMINOPHEN 5-325 MG PO TABS
2.0000 | ORAL_TABLET | Freq: Once | ORAL | Status: AC
  Administered 2013-07-31: 2 via ORAL
  Filled 2013-07-31: qty 2

## 2013-07-31 MED ORDER — HYDROCODONE-ACETAMINOPHEN 5-325 MG PO TABS: 1.0000 | ORAL_TABLET | Freq: Four times a day (QID) | ORAL | Status: DC | PRN
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
## 2013-07-31 NOTE — Progress Notes (Signed)
[facility] provided pt with a list of primary care resources.  °

## 2013-07-31 NOTE — ED Provider Notes (Signed)
Medical screening examination/treatment/procedure(s) were performed by non-physician practitioner and as supervising physician I was immediately available for consultation/collaboration.  EKG Interpretation   None        Donnetta Hutching, MD 07/31/13 1429
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
## 2013-09-18 ENCOUNTER — Telehealth: Payer: Self-pay | Admitting: *Deleted

## 2013-09-18 ENCOUNTER — Other Ambulatory Visit: Payer: Self-pay | Admitting: *Deleted

## 2013-09-18 DIAGNOSIS — B379 Candidiasis, unspecified: Secondary | ICD-10-CM

## 2013-09-18 MED ORDER — FLUCONAZOLE 150 MG PO TABS: 150.0000 mg | ORAL_TABLET | Freq: Once | ORAL | Status: DC

## 2013-09-18 NOTE — Telephone Encounter (Signed)
Diflucan 150mg  x 1tab sent to pharmacy. Pt made aware.

## 2013-09-20 ENCOUNTER — Encounter: Payer: Self-pay | Admitting: Obstetrics

## 2013-09-24 ENCOUNTER — Telehealth: Payer: Self-pay | Admitting: *Deleted

## 2013-09-24 NOTE — Telephone Encounter (Signed)
Patient believes she has a bacterial infection. Requesting prescription.  Subscriber unavailable.

## 2013-09-26 MED ORDER — METRONIDAZOLE 500 MG PO TABS: 500.0000 mg | ORAL_TABLET | Freq: Two times a day (BID) | ORAL | Status: DC

## 2013-09-26 NOTE — Telephone Encounter (Signed)
Patient states she is having a white milky discharge with odor. Pt denies any itching, irritation or odor. Per nursing protocol Metronidazole sent to the pharmacy. Patient advised to call back to make an appointment if the medication does not clear the issue. Patient verbalized understanding.

## 2013-10-29 ENCOUNTER — Inpatient Hospital Stay (HOSPITAL_COMMUNITY)
Admission: AD | Admit: 2013-10-29 | Discharge: 2013-10-29 | Disposition: A | Discharge status: Home / Self Care | Payer: BC Managed Care – PPO | Source: Ambulatory Visit | Attending: Obstetrics & Gynecology | Admitting: Obstetrics & Gynecology

## 2013-10-29 ENCOUNTER — Ambulatory Visit: Payer: BC Managed Care – PPO | Admitting: Obstetrics

## 2013-10-29 DIAGNOSIS — N898 Other specified noninflammatory disorders of vagina: Secondary | ICD-10-CM | POA: Insufficient documentation

## 2013-10-29 DIAGNOSIS — R109 Unspecified abdominal pain: Secondary | ICD-10-CM | POA: Insufficient documentation

## 2013-10-29 DIAGNOSIS — N949 Unspecified condition associated with female genital organs and menstrual cycle: Secondary | ICD-10-CM | POA: Insufficient documentation

## 2013-10-29 LAB — URINALYSIS, ROUTINE W REFLEX MICROSCOPIC
BILIRUBIN URINE: NEGATIVE
Glucose, UA: NEGATIVE mg/dL
HGB URINE DIPSTICK: NEGATIVE
Ketones, ur: NEGATIVE mg/dL
Nitrite: NEGATIVE
PROTEIN: NEGATIVE mg/dL
UROBILINOGEN UA: 2 mg/dL — AB (ref 0.0–1.0)
pH: 6 (ref 5.0–8.0)

## 2013-10-29 LAB — URINE MICROSCOPIC-ADD ON

## 2013-10-29 LAB — POCT PREGNANCY, URINE: Preg Test, Ur: NEGATIVE

## 2013-10-29 NOTE — MAU Note (Signed)
Pt called from waiting area to be brought back to room 3.  Pt left without seeing practioner or signing AMA

## 2013-10-29 NOTE — MAU Note (Signed)
Patient states she has had a white vaginal discharge with an odor for about one week. Started having abdominal pain yesterday. Denies nausea or vomiting, but had vomiting and diarrhea 2 days ago. Denies S/S of the flu.

## 2013-10-30 ENCOUNTER — Encounter: Payer: Self-pay | Admitting: Obstetrics

## 2013-10-31 ENCOUNTER — Encounter: Payer: Self-pay | Admitting: Obstetrics

## 2013-10-31 ENCOUNTER — Ambulatory Visit (INDEPENDENT_AMBULATORY_CARE_PROVIDER_SITE_OTHER): Payer: BC Managed Care – PPO | Admitting: Obstetrics

## 2013-10-31 ENCOUNTER — Ambulatory Visit (INDEPENDENT_AMBULATORY_CARE_PROVIDER_SITE_OTHER): Payer: BC Managed Care – PPO | Admitting: *Deleted

## 2013-10-31 VITALS — BP 112/61 | HR 78 | Temp 98.0°F | Ht 64.5 in | Wt 125.0 lb

## 2013-10-31 DIAGNOSIS — A499 Bacterial infection, unspecified: Secondary | ICD-10-CM

## 2013-10-31 DIAGNOSIS — Z3009 Encounter for other general counseling and advice on contraception: Secondary | ICD-10-CM

## 2013-10-31 DIAGNOSIS — Z3202 Encounter for pregnancy test, result negative: Secondary | ICD-10-CM

## 2013-10-31 DIAGNOSIS — Z0000 Encounter for general adult medical examination without abnormal findings: Secondary | ICD-10-CM

## 2013-10-31 DIAGNOSIS — B373 Candidiasis of vulva and vagina: Secondary | ICD-10-CM

## 2013-10-31 DIAGNOSIS — Z3049 Encounter for surveillance of other contraceptives: Secondary | ICD-10-CM

## 2013-10-31 DIAGNOSIS — IMO0001 Reserved for inherently not codable concepts without codable children: Secondary | ICD-10-CM

## 2013-10-31 DIAGNOSIS — N760 Acute vaginitis: Secondary | ICD-10-CM | POA: Insufficient documentation

## 2013-10-31 DIAGNOSIS — Z309 Encounter for contraceptive management, unspecified: Secondary | ICD-10-CM

## 2013-10-31 DIAGNOSIS — B3731 Acute candidiasis of vulva and vagina: Secondary | ICD-10-CM

## 2013-10-31 DIAGNOSIS — N898 Other specified noninflammatory disorders of vagina: Secondary | ICD-10-CM

## 2013-10-31 DIAGNOSIS — B9689 Other specified bacterial agents as the cause of diseases classified elsewhere: Secondary | ICD-10-CM

## 2013-10-31 LAB — POCT URINE PREGNANCY: PREG TEST UR: NEGATIVE

## 2013-10-31 MED ORDER — MEDROXYPROGESTERONE ACETATE 150 MG/ML IM SUSP: 150.0000 mg | INTRAMUSCULAR | Status: DC

## 2013-10-31 MED ORDER — MEDROXYPROGESTERONE ACETATE 150 MG/ML IM SUSP
150.0000 mg | Freq: Once | INTRAMUSCULAR | Status: AC
  Administered 2013-10-31: 150 mg via INTRAMUSCULAR

## 2013-10-31 MED ORDER — PNV PRENATAL PLUS MULTIVITAMIN 27-1 MG PO TABS: 1.0000 | ORAL_TABLET | Freq: Every day | ORAL | Status: DC

## 2013-10-31 MED ORDER — METRONIDAZOLE 500 MG PO TABS: 500.0000 mg | ORAL_TABLET | Freq: Two times a day (BID) | ORAL | Status: DC

## 2013-10-31 MED ORDER — FLUCONAZOLE 150 MG PO TABS: 150.0000 mg | ORAL_TABLET | Freq: Once | ORAL | Status: DC

## 2013-10-31 NOTE — Progress Notes (Signed)
Patient in office today for Depo injection. UPT preformed, results were negative. Depo give in right upper outer quadrant. Patient tolerated well. Patient notified to return in three months around Jan 22, 2014 for next Depo injection.

## 2013-10-31 NOTE — Progress Notes (Signed)
Subjective:     [place] Azucena KubaReid is a 23 y.o. female here for a routine exam.  Current complaints: vaginal discharge. Patient states she has a white milky discharge. Patient states she has an odor with the discharge. Patient states she is having recurrent bacterial infection. Personal health questionnaire reviewed: yes.   Gynecologic History Patient's last menstrual period was 10/06/2013. Contraception: none   Obstetric History OB History  No data available     The following portions of the patient's history were reviewed and updated as appropriate: allergies, current medications, past family history, past medical history, past social history, past surgical history and problem list.  Review of Systems Pertinent items are noted in HPI.    Objective:    General appearance: alert and no distress Abdomen: normal findings: soft, non-tender Pelvic: cervix normal in appearance, external genitalia normal, no adnexal masses or tenderness, no cervical motion tenderness, rectovaginal septum normal, uterus normal size, shape, and consistency and vagina normal without discharge    Assessment:    BV  Contraceptive counseling.   Plan:    Education reviewed: safe sex/STD prevention and Contraceptive choices.. Contraception: Depo-Provera injections. Follow up in: several months. Flagyl and Diflucan Rx Depo provera Rx

## 2013-11-01 LAB — WET PREP BY MOLECULAR PROBE
CANDIDA SPECIES: NEGATIVE
Gardnerella vaginalis: POSITIVE — AB
Trichomonas vaginosis: NEGATIVE

## 2013-11-01 LAB — GC/CHLAMYDIA PROBE AMP
CT PROBE, AMP APTIMA: NEGATIVE
GC Probe RNA: NEGATIVE

## 2014-01-22 ENCOUNTER — Ambulatory Visit: Payer: BC Managed Care – PPO

## 2014-03-05 IMAGING — CR DG CHEST 2V
2 series · 2 of 2 positions shown · non-contrast
Comparison: None.

CLINICAL DATA: Cough, chest pain.

CHEST - 2 VIEW

[w chest pa]
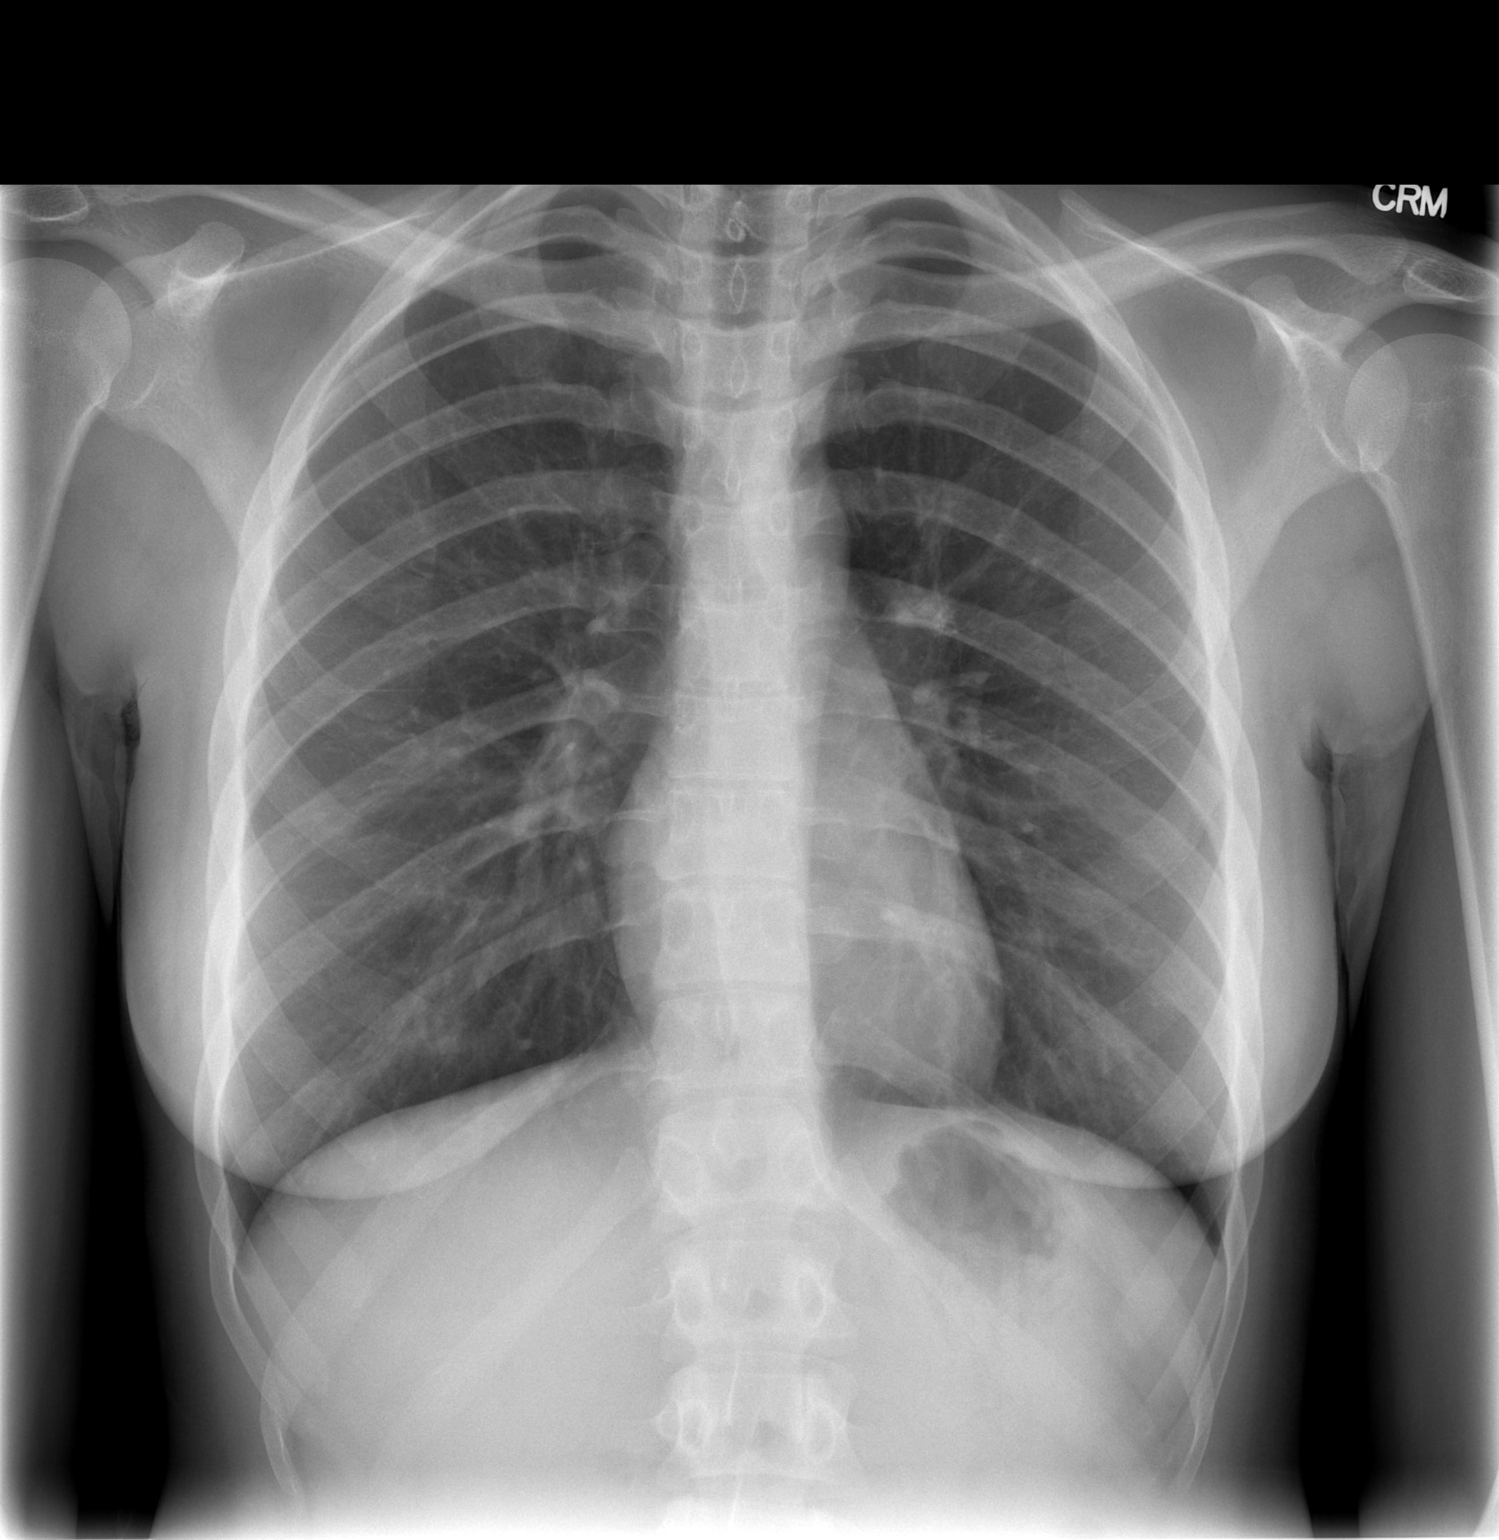

[w chest lat]
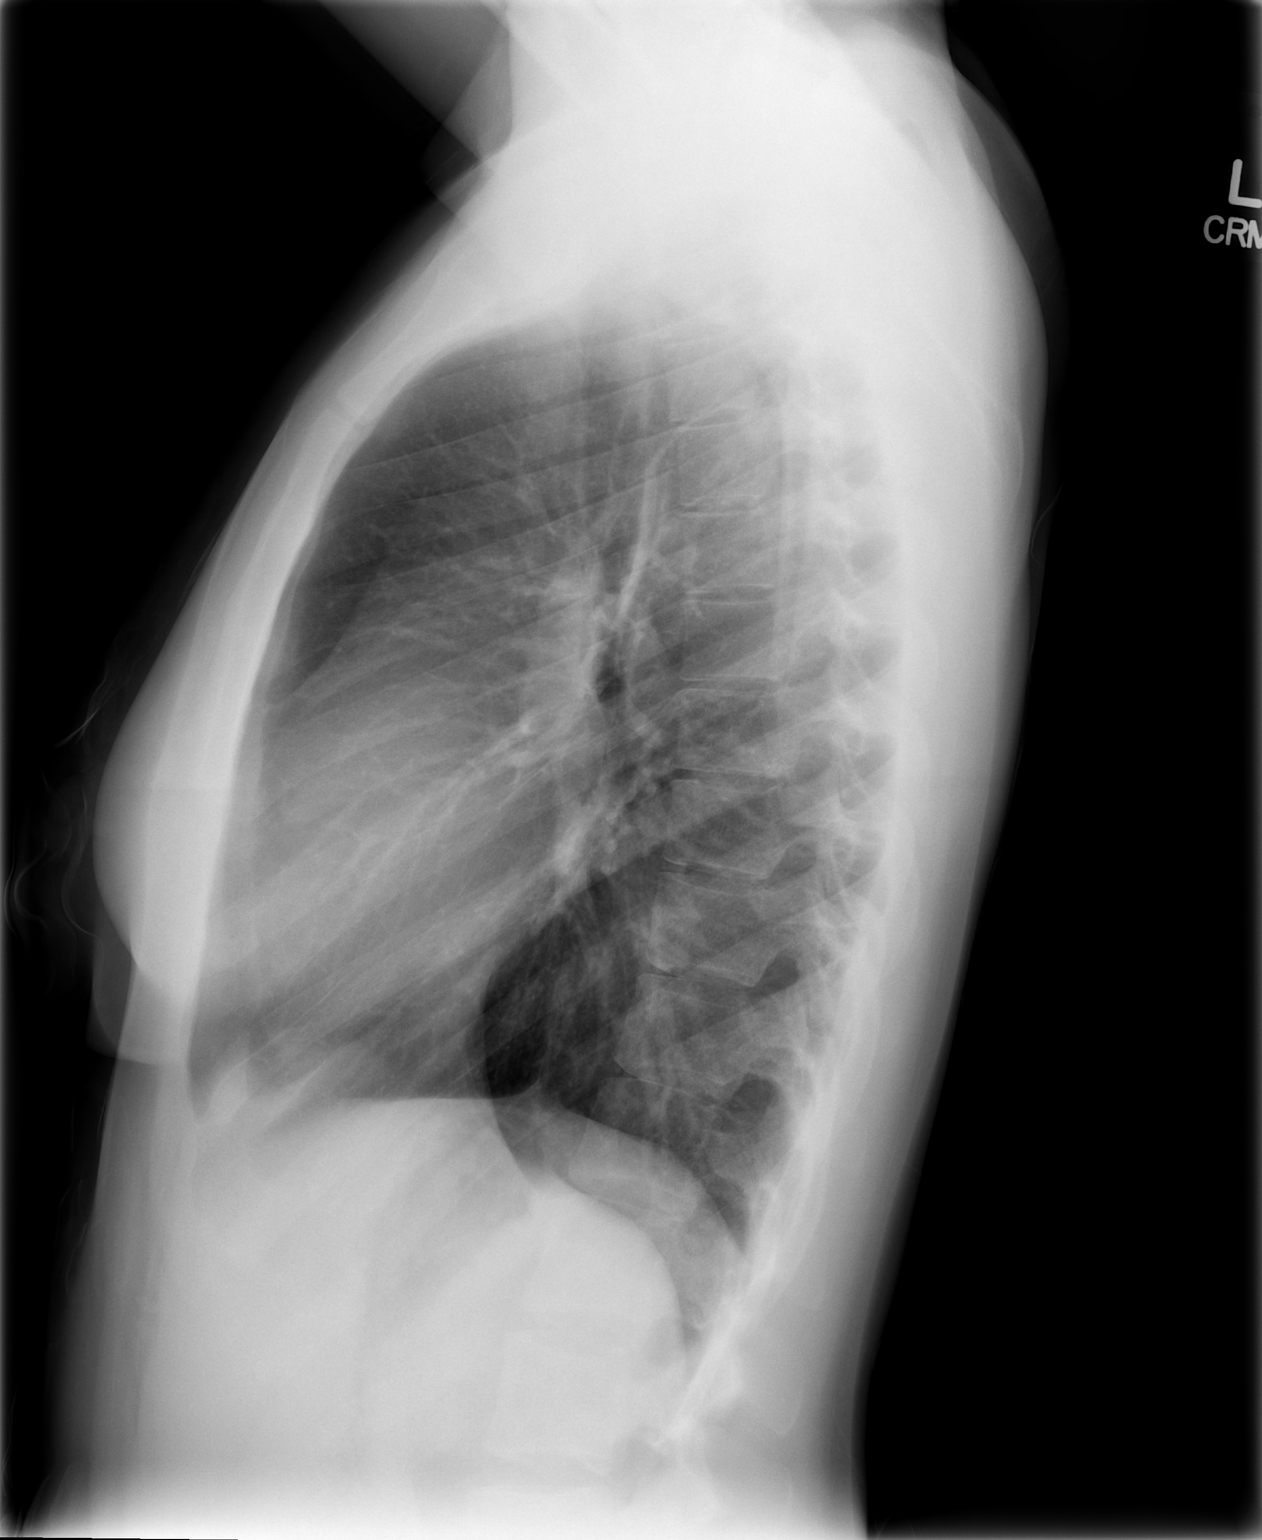

[2 of 2 positions shown; findings below may reference images not displayed]

FINDINGS: Lungs are clear. No pleural effusion or pneumothorax. The
cardiomediastinal contours are within normal limits. The visualized
bones and soft tissues are without significant appreciable
abnormality.
IMPRESSION: No radiographic evidence of acute cardiopulmonary process.

## 2014-06-09 ENCOUNTER — Telehealth: Payer: Self-pay | Admitting: *Deleted

## 2014-06-09 DIAGNOSIS — Z3009 Encounter for other general counseling and advice on contraception: Secondary | ICD-10-CM

## 2014-06-09 MED ORDER — MEDROXYPROGESTERONE ACETATE 150 MG/ML IM SUSP: 150.0000 mg | INTRAMUSCULAR | Status: DC

## 2014-06-09 NOTE — Telephone Encounter (Signed)
Patient states she wants to restart her Depo. 6:44 Call to patient - her last shot was @/2015. Patient wants to restart her Depo. Patient is having cycles. Patient given option to call with next cycle or do UPT/2 week UPT restart. Patient wants to call with her next cycle- she will abstain or use condoms with intercourse. Patient is also due her annual exam the end of October and she will schedule that. Rx sent to pharmacy.

## 2014-06-27 ENCOUNTER — Ambulatory Visit (INDEPENDENT_AMBULATORY_CARE_PROVIDER_SITE_OTHER): Payer: BC Managed Care – PPO | Admitting: *Deleted

## 2014-06-27 VITALS — BP 106/65 | HR 53 | Temp 98.2°F | Ht 64.5 in | Wt 134.0 lb

## 2014-06-27 DIAGNOSIS — Z3042 Encounter for surveillance of injectable contraceptive: Secondary | ICD-10-CM

## 2014-06-27 LAB — POCT URINE PREGNANCY: PREG TEST UR: NEGATIVE

## 2014-06-27 MED ORDER — MEDROXYPROGESTERONE ACETATE 150 MG/ML IM SUSP
150.0000 mg | INTRAMUSCULAR | Status: DC
  Administered 2014-06-27: 150 mg via INTRAMUSCULAR

## 2014-06-27 MED ORDER — FLUCONAZOLE 150 MG PO TABS: 150.0000 mg | ORAL_TABLET | Freq: Once | ORAL | Status: DC

## 2014-06-27 MED ORDER — IBUPROFEN 800 MG PO TABS: 800.0000 mg | ORAL_TABLET | Freq: Three times a day (TID) | ORAL | Status: DC | PRN

## 2014-06-27 NOTE — Progress Notes (Signed)
Patient is in the office today for UPT, DEPO Restart. Patient is currently on her cycle that started Tuesday. Patient states the last time she had unprotected sexual intercourse was 2 weeks ago. UPT preformed, results were negative. Per Nursing protocol ok to given injection. Injection given in Left Upper Outer Quadrant. Patient tolerated well. Patient states she is having a thick white discharge and would like a refill on her Diflucan. Patient states she also needs a refill on her Ibuprofen. Per Dr. Clearance CootsHarper, ok to refill. Patient states she needs to schedule her annual exam, Patient notified that she could do that at the front when she checks out. Patient states she has sharp pains that go through her left breast and that she has a bump on her right nipple. Patient notified that Dr. Clearance CootsHarper said we would address the breast pain and bump at her annual exam appointment, patient states would she be able to have STD testing done at that time. Patient notified that yes we would do all of that at her annual exam. Patient voiced understanding and sent to the front to schedule her annual exam and Next DEPO Injection which is Due September 18, 2014.   BP 106/65  Pulse 53  Temp(Src) 98.2 F (36.8 C)  Ht 5' 4.5" (1.638 m)  Wt 134 lb (60.782 kg)  BMI 22.65 kg/m2  LMP 06/24/2014  Results for orders placed in visit on 06/27/14 (from the past 24 hour(s))  POCT URINE PREGNANCY     Status: None   Collection Time    06/27/14 11:56 AM      Result Value Ref Range   Preg Test, Ur Negative      Administrations This Visit   medroxyPROGESTERone (DEPO-PROVERA) injection 150 mg   Administered Action Dose Route Administered By   06/27/2014 Given 150 mg Intramuscular Odessa FlemingKristina M Raynesha Tiedt, LPN

## 2014-09-18 ENCOUNTER — Ambulatory Visit: Payer: BC Managed Care – PPO

## 2014-09-18 ENCOUNTER — Ambulatory Visit: Payer: BC Managed Care – PPO | Admitting: Obstetrics

## 2014-10-20 ENCOUNTER — Telehealth: Payer: Self-pay | Admitting: *Deleted

## 2014-10-20 NOTE — Telephone Encounter (Signed)
Patient had contacted office. Attempted to contact the patient and left message for patient to call the office.

## 2014-10-31 NOTE — Telephone Encounter (Signed)
Left message for patient for her to contact the office if she still has any concerns. Call to be re-filed.

## 2015-01-26 ENCOUNTER — Ambulatory Visit: Payer: Medicaid Other | Admitting: Obstetrics

## 2015-01-27 ENCOUNTER — Telehealth: Payer: Self-pay | Admitting: *Deleted

## 2015-01-27 MED ORDER — FLUCONAZOLE 150 MG PO TABS: 150.0000 mg | ORAL_TABLET | Freq: Every day | ORAL | Status: DC

## 2015-01-27 NOTE — Telephone Encounter (Signed)
Patient has yeast infection and is requesting treatment. 3:12 Patient states she is having symptoms and would like oral treatment. Told patient if her symptoms do not improve- she will need to come to office.

## 2015-02-10 ENCOUNTER — Telehealth: Payer: Self-pay | Admitting: *Deleted

## 2015-02-10 DIAGNOSIS — N760 Acute vaginitis: Principal | ICD-10-CM

## 2015-02-10 DIAGNOSIS — T3695XA Adverse effect of unspecified systemic antibiotic, initial encounter: Secondary | ICD-10-CM

## 2015-02-10 DIAGNOSIS — B9689 Other specified bacterial agents as the cause of diseases classified elsewhere: Secondary | ICD-10-CM

## 2015-02-10 DIAGNOSIS — B379 Candidiasis, unspecified: Secondary | ICD-10-CM

## 2015-02-10 MED ORDER — FLUCONAZOLE 150 MG PO TABS: 150.0000 mg | ORAL_TABLET | Freq: Every day | ORAL | Status: DC

## 2015-02-10 MED ORDER — METRONIDAZOLE 500 MG PO TABS: 500.0000 mg | ORAL_TABLET | Freq: Two times a day (BID) | ORAL | Status: DC

## 2015-02-10 NOTE — Telephone Encounter (Signed)
Please call 1:49 Patient request treatment for BV- she is having discharge with odor. Patient would like oral treatment and also request Diflucan to take afterward. Rx sent to pharmacy per office protocol. Patient instructed to call for appointment if her symptoms do not improve.

## 2015-02-19 ENCOUNTER — Ambulatory Visit: Payer: Self-pay | Admitting: Obstetrics

## 2015-04-05 ENCOUNTER — Inpatient Hospital Stay (HOSPITAL_COMMUNITY)
Admission: AD | Admit: 2015-04-05 | Discharge: 2015-04-05 | Disposition: A | Discharge status: Home / Self Care | Payer: Medicaid Other | Source: Ambulatory Visit | Attending: Obstetrics & Gynecology | Admitting: Obstetrics & Gynecology

## 2015-04-05 DIAGNOSIS — N61 Inflammatory disorders of breast: Secondary | ICD-10-CM

## 2015-04-05 DIAGNOSIS — N644 Mastodynia: Secondary | ICD-10-CM | POA: Diagnosis present

## 2015-04-05 MED ORDER — SULFAMETHOXAZOLE-TRIMETHOPRIM 800-160 MG PO TABS: 1.0000 | ORAL_TABLET | Freq: Two times a day (BID) | ORAL | Status: DC

## 2015-04-05 MED ORDER — IBUPROFEN 800 MG PO TABS: 800.0000 mg | ORAL_TABLET | Freq: Three times a day (TID) | ORAL | Status: DC | PRN

## 2015-04-05 NOTE — MAU Note (Addendum)
Right side breast pain x 3 days, feels like a lump.  No discharge.  Not pregnant. No fever. Took piercing out last Friday.

## 2015-04-05 NOTE — MAU Provider Note (Signed)
History     CSN: [id number]  Arrival date and time: 04/05/15 [id number]  First Provider Initiated Contact with Patient 04/05/15 0325     Chief Complaint  Patient presents with  . Breast Pain   HPI: Pt reports painful right breast pain and drainage from nipple since 04/02/2015. Took nipple piercing out 04/03/2015. Not currently or recently breast-feeding. Denies fever, chills. Rates pain 7/10, throbbing.   OB History    Gravida Para Term Preterm AB TAB SAB Ectopic Multiple Living   2 1 1  1  1   1       No past medical history on file.  No past surgical history on file.  Family History  Problem Relation Age of Onset  . Heart disease Father   . Hypertension Maternal Grandfather   . COPD Maternal Grandfather     History  Substance Use Topics  . Smoking status: Current Every Day Smoker  . Smokeless tobacco: Never Used  . Alcohol Use: 4.6 oz/week    4 Shots of liquor, 2 Standard drinks or equivalent, 2 Cans of beer per week    Allergies:  Allergies  Allergen Reactions  . Penicillins Hives    Facility-administered medications prior to admission  Medication Dose Route Frequency Provider Last Rate Last Dose  . medroxyPROGESTERone (DEPO-PROVERA) injection 150 mg  150 mg Intramuscular Q90 days Brock Badharles A Harper, MD   150 mg at 06/27/14 1157   Prescriptions prior to admission  Medication Sig Dispense Refill Last Dose  . fluconazole (DIFLUCAN) 150 MG tablet Take 1 tablet (150 mg total) by mouth daily. 1 tablet 0   . medroxyPROGESTERone (DEPO-PROVERA) 150 MG/ML injection Inject 1 mL (150 mg total) into the muscle every 3 (three) months. 1 mL 3 Taking  . metroNIDAZOLE (FLAGYL) 500 MG tablet Take 1 tablet (500 mg total) by mouth 2 (two) times daily. 14 tablet 0   . traMADol (ULTRAM) 50 MG tablet Take 1 tablet (50 mg total) by mouth every 6 (six) hours as needed for moderate pain. 10 tablet 0 Taking  . [DISCONTINUED] ibuprofen (ADVIL,MOTRIN) 800 MG tablet Take 1 tablet (800 mg total)  by mouth every 8 (eight) hours as needed. 30 tablet prn     Review of Systems  Constitutional: Negative for fever and chills.  Musculoskeletal: Negative for myalgias.  Skin:       Pos for right breast pain, tenderness and drainage from site of nipple piercing.     Physical Exam   Blood pressure 117/56, pulse 66, temperature 98.3 F (36.8 C), temperature source Oral, resp. rate 16, height 5\' 4"  (1.626 m), weight 133 lb (60.328 kg), SpO2 99 %.  Physical Exam  Nursing note and vitals reviewed. Constitutional: She is oriented to person, place, and time. She appears well-developed and well-nourished. She appears distressed.  Cardiovascular: Normal rate and regular rhythm.   Respiratory: Effort normal. No respiratory distress. Right breast exhibits mass (non-fluctuant 3 cm mass behind right nipple), nipple discharge, skin change (erythema) and tenderness. Right breast exhibits no inverted nipple. Left breast exhibits no inverted nipple, no mass, no nipple discharge, no skin change and no tenderness. Breasts are symmetrical.  Neurological: She is alert and oriented to person, place, and time.  Skin: Skin is warm and dry.  Psychiatric: She has a normal mood and affect.    MAU Course  Procedures  MDM Exam C/W non-lactational mastitis w/ possible small abscess from piercing. Doubtful that I and D is necessary at this time due to small  collection. We'll treat with anabiotic with MRSA coverage per consult with Dr. Clearance Coots.  Assessment and Plan   1. Acute mastitis of right breast    Discharge home in stable condition per consult with Dr. Clearance Coots. Follow-up Information    Follow up with [facility] On 04/08/2015.   Why:  follow-up mastitis    Contact information:   [address] [place] [address] [phone]      Follow up with [facility]EM[facility]ENCY [facility].   Specialty:  Emergency Medicine   Why:  As needed if symptoms worsen    Contact information:   [address] [place] [id number] mc [place] [address] [phone]       Medication List    STOP taking these medications        fluconazole 150 MG tablet  Commonly known as:  DIFLUCAN     medroxyPROGESTERone 150 MG/ML injection  Commonly known as:  DEPO-PROVERA     metroNIDAZOLE 500 MG tablet  Commonly known as:  FLAGYL     traMADol 50 MG tablet  Commonly known as:  ULTRAM      TAKE these medications        ibuprofen 800 MG tablet  Commonly known as:  ADVIL,MOTRIN  Take 1 tablet (800 mg total) by mouth every 8 (eight) hours as needed for moderate pain.       Stefano Trulson 04/05/2015, 3:35 AM

## 2015-04-05 NOTE — Discharge Instructions (Signed)
Mastitis Mastitis is inflammation of the breast tissue. It occurs most often in women who are breastfeeding, but it can also affect other women, and even sometimes men. CAUSES  Mastitis is usually caused by a bacterial infection. Bacteria enter the breast tissue through cuts or openings in the skin. Typically, this occurs with breastfeeding because of cracked or irritated skin. Sometimes, it can occur even when there is no opening in the skin. It can be associated with plugged milk (lactiferous) ducts. Nipple piercing can also lead to mastitis. Also, some forms of breast cancer can cause mastitis. SIGNS AND SYMPTOMS   Swelling, redness, tenderness, and pain in an area of the breast.  Swelling of the glands under the arm on the same side.  Fever. If an infection is allowed to progress, a collection of pus (abscess) may develop. DIAGNOSIS  Your health care provider can usually diagnose mastitis based on your symptoms and a physical exam. Tests may be done to help confirm the diagnosis. These may include:   Removal of pus from the breast by applying pressure to the area. This pus can be examined in the lab to determine which bacteria are present. If an abscess has developed, the fluid in the abscess can be removed with a needle. This can also be used to confirm the diagnosis and determine the bacteria present. In most cases, pus will not be present.  Blood tests to determine if your body is fighting a bacterial infection.  Mammogram or ultrasound tests to rule out other problems or diseases. TREATMENT  Antibiotic medicine is used to treat a bacterial infection. Your health care provider will determine which bacteria are most likely causing the infection and will select an appropriate antibiotic. This is sometimes changed based on the results of tests performed to identify the bacteria, or if there is no response to the antibiotic selected. Antibiotics are usually given by mouth. You may also be  given medicine for pain. Mastitis that occurs with breastfeeding will sometimes go away on its own, so your health care provider may choose to wait 24 hours after first seeing you to decide whether a prescription medicine is needed. HOME CARE INSTRUCTIONS   Only take over-the-counter or prescription medicines for pain, fever, or discomfort as directed by your health care provider.  If your health care provider prescribed an antibiotic, take the medicine as directed. Make sure you finish it even if you start to feel better.  Do not wear a tight or underwire bra. Wear a soft, supportive bra.  Increase your fluid intake, especially if you have a fever.  Women who are breastfeeding should follow these instructions:  Continue to empty the breast. Your health care provider can tell you whether this milk is safe for your infant or needs to be thrown out. You may be told to stop nursing until your health care provider thinks it is safe for your baby. Use a breast pump if you are advised to stop nursing.  Keep your nipples clean and dry.  Empty the first breast completely before going to the other breast. If your baby is not emptying your breasts completely for some reason, use a breast pump to empty your breasts.  If you go back to work, pump your breasts while at work to stay in time with your nursing schedule.  Avoid allowing your breasts to become overly filled with milk (engorged). SEEK MEDICAL CARE IF:   You have pus-like discharge from the breast.  Your symptoms do not   improve with the treatment prescribed by your health care provider within 2 days. SEEK IMMEDIATE MEDICAL CARE IF:   Your pain and swelling are getting worse.  You have pain that is not controlled with medicine.  You have a red line extending from the breast toward your armpit.  You have a fever or persistent symptoms for more than 2-3 days.  You have a fever and your symptoms suddenly get worse. Document Released:  09/05/2005 Document Revised: 09/10/2013 Document Reviewed: 04/05/2013 Exit[employer] Patient Information 2015 [employer]. This information is not intended to replace advice given to you by your health care provider. Make sure you discuss any questions you have with your health care provider.  

## 2015-04-08 ENCOUNTER — Ambulatory Visit (INDEPENDENT_AMBULATORY_CARE_PROVIDER_SITE_OTHER): Payer: Medicaid Other | Admitting: Obstetrics

## 2015-04-08 ENCOUNTER — Encounter: Payer: Self-pay | Admitting: Obstetrics

## 2015-04-08 VITALS — BP 112/57 | HR 73 | Temp 99.3°F | Wt 126.0 lb

## 2015-04-08 DIAGNOSIS — B372 Candidiasis of skin and nail: Secondary | ICD-10-CM

## 2015-04-08 DIAGNOSIS — N61 Inflammatory disorders of breast: Secondary | ICD-10-CM | POA: Diagnosis not present

## 2015-04-08 DIAGNOSIS — B3731 Acute candidiasis of vulva and vagina: Secondary | ICD-10-CM

## 2015-04-08 DIAGNOSIS — B373 Candidiasis of vulva and vagina: Secondary | ICD-10-CM

## 2015-04-08 MED ORDER — NYSTATIN 100000 UNIT/GM EX CREA: 1.0000 "application " | TOPICAL_CREAM | Freq: Two times a day (BID) | CUTANEOUS | Status: DC

## 2015-04-08 MED ORDER — SULFAMETHOXAZOLE-TRIMETHOPRIM 800-160 MG PO TABS: 1.0000 | ORAL_TABLET | Freq: Two times a day (BID) | ORAL | Status: DC

## 2015-04-08 MED ORDER — FLUCONAZOLE 200 MG PO TABS: ORAL_TABLET | ORAL | Status: DC

## 2015-04-08 MED ORDER — FLUCONAZOLE 200 MG PO TABS: 200.0000 mg | ORAL_TABLET | Freq: Every day | ORAL | Status: DC

## 2015-04-08 NOTE — Progress Notes (Signed)
Patient ID: Tammie Martinez, female   DOB: 1991/01/10, 24 y.o.   MRN: [id number]  Chief Complaint  Patient presents with  . Follow-up    HPI Tammie Griffith is a 24 y.o. female.  Mastitis of right breast, treatment started at [facility] 4 days ago with Bactrim DS.  Patient states the area is draining, nontender but itching.  She also has vaginal itching and discharge. HPI  History reviewed. No pertinent past medical history.  History reviewed. No pertinent past surgical history.  Family History  Problem Relation Age of Onset  . Heart disease Father   . Hypertension Maternal Grandfather   . COPD Maternal Grandfather     Social History History  Substance Use Topics  . Smoking status: Current Every Day Smoker  . Smokeless tobacco: Never Used  . Alcohol Use: 4.6 oz/week    4 Shots of liquor, 2 Standard drinks or equivalent, 2 Cans of beer per week    Allergies  Allergen Reactions  . Penicillins Hives    Current Outpatient Prescriptions  Medication Sig Dispense Refill  . ibuprofen (ADVIL,MOTRIN) 800 MG tablet Take 1 tablet (800 mg total) by mouth every 8 (eight) hours as needed for moderate pain. 30 tablet 2  . sulfamethoxazole-trimethoprim (BACTRIM DS,SEPTRA DS) 800-160 MG per tablet Take 1 tablet by mouth 2 (two) times daily. 14 tablet 1  . fluconazole (DIFLUCAN) 200 MG tablet 1 tablet by mouth every other day. 3 tablet 2  . nystatin cream (MYCOSTATIN) Apply 1 application topically 2 (two) times daily. 30 g 0   No current facility-administered medications for this visit.    Review of Systems Review of Systems Constitutional: negative for fatigue and weight loss Respiratory: negative for cough and wheezing Cardiovascular: negative for chest pain, fatigue and palpitations Gastrointestinal: negative for abdominal pain and change in bowel habits Genitourinary: vaginal discharge and itching Integument/breast: positive for draining and itchy nipple abscess  Musculoskeletal:negative for  myalgias Neurological: negative for gait problems and tremors Behavioral/Psych: negative for abusive relationship, depression Endocrine: negative for temperature intolerance     Blood pressure 112/57, pulse 73, temperature 99.3 F (37.4 C), weight 126 lb (57.153 kg), last menstrual period 03/25/2015.  Physical Exam Physical Exam General:   alert and no distress  Skin:   draining right breast nipple  Lungs:   clear to auscultation bilaterally  Heart:   regular rate and rhythm, S1, S2 normal, no murmur, click, rub or gallop  Breasts:   nipple discharge from right breast   Abdomen:  normal findings: no organomegaly, soft, non-tender and no hernia  Pelvis:  External genitalia: normal general appearance Urinary system: urethral meatus normal and bladder without fullness, nontender Vaginal: normal without tenderness, induration or masses.  White cheesy discharge Cervix: normal appearance Adnexa: normal bimanual exam Uterus: anteverted and non-tender, normal size      Data Reviewed Triage notes from [facility]  Assessment     Mastitis of right breast  Candida vulvovaginitis Candida nipple infection    Plan    Continue Bactrim DS for total of 14 days. Diflucan for vaginal yeast  Nystatin cream for nipple yeast  No orders of the defined types were placed in this encounter.   Meds ordered this encounter  Medications  . DISCONTD: sulfamethoxazole-trimethoprim (BACTRIM DS,SEPTRA DS) 800-160 MG per tablet    Sig: Take 1 tablet by mouth 2 (two) times daily.    Dispense:  14 tablet    Refill:  1  . DISCONTD: fluconazole (DIFLUCAN) 200 MG  tablet    Sig: Take 1 tablet (200 mg total) by mouth daily.    Dispense:  3 tablet    Refill:  2  . nystatin cream (MYCOSTATIN)    Sig: Apply 1 application topically 2 (two) times daily.    Dispense:  30 g    Refill:  0  . sulfamethoxazole-trimethoprim (BACTRIM DS,SEPTRA DS) 800-160 MG per tablet    Sig: Take 1 tablet by mouth 2  (two) times daily.    Dispense:  14 tablet    Refill:  1  . fluconazole (DIFLUCAN) 200 MG tablet    Sig: 1 tablet by mouth every other day.    Dispense:  3 tablet    Refill:  2

## 2015-04-11 ENCOUNTER — Encounter (HOSPITAL_COMMUNITY): Payer: Self-pay | Admitting: Emergency Medicine

## 2015-04-11 ENCOUNTER — Emergency Department (HOSPITAL_COMMUNITY)
Admission: EM | Admit: 2015-04-11 | Discharge: 2015-04-11 | Disposition: A | Discharge status: Home / Self Care | Payer: Medicaid Other | Attending: Emergency Medicine | Admitting: Emergency Medicine

## 2015-04-11 DIAGNOSIS — Z720 Tobacco use: Secondary | ICD-10-CM | POA: Diagnosis not present

## 2015-04-11 DIAGNOSIS — Y939 Activity, unspecified: Secondary | ICD-10-CM | POA: Diagnosis not present

## 2015-04-11 DIAGNOSIS — Z880 Allergy status to penicillin: Secondary | ICD-10-CM | POA: Insufficient documentation

## 2015-04-11 DIAGNOSIS — X58XXXA Exposure to other specified factors, initial encounter: Secondary | ICD-10-CM | POA: Insufficient documentation

## 2015-04-11 DIAGNOSIS — T7840XA Allergy, unspecified, initial encounter: Secondary | ICD-10-CM

## 2015-04-11 DIAGNOSIS — Z79899 Other long term (current) drug therapy: Secondary | ICD-10-CM | POA: Diagnosis not present

## 2015-04-11 DIAGNOSIS — Y929 Unspecified place or not applicable: Secondary | ICD-10-CM | POA: Insufficient documentation

## 2015-04-11 DIAGNOSIS — Y999 Unspecified external cause status: Secondary | ICD-10-CM | POA: Insufficient documentation

## 2015-04-11 MED ORDER — PREDNISONE 20 MG PO TABS: ORAL_TABLET | ORAL | Status: DC

## 2015-04-11 MED ORDER — CEPHALEXIN 500 MG PO CAPS: 500.0000 mg | ORAL_CAPSULE | Freq: Four times a day (QID) | ORAL | Status: DC

## 2015-04-11 MED ORDER — FAMOTIDINE IN NACL 20-0.9 MG/50ML-% IV SOLN
20.0000 mg | Freq: Once | INTRAVENOUS | Status: AC
  Administered 2015-04-11: 20 mg via INTRAVENOUS
  Filled 2015-04-11: qty 50

## 2015-04-11 MED ORDER — DIPHENHYDRAMINE HCL 50 MG/ML IJ SOLN
25.0000 mg | Freq: Once | INTRAMUSCULAR | Status: AC
  Administered 2015-04-11: 25 mg via INTRAVENOUS
  Filled 2015-04-11: qty 1

## 2015-04-11 MED ORDER — METHYLPREDNISOLONE SODIUM SUCC 125 MG IJ SOLR
125.0000 mg | Freq: Once | INTRAMUSCULAR | Status: AC
  Administered 2015-04-11: 125 mg via INTRAVENOUS
  Filled 2015-04-11: qty 2

## 2015-04-11 NOTE — ED Provider Notes (Signed)
CSN: [id number]     Arrival date & time 04/11/15  1929 History   First MD Initiated Contact with Patient 04/11/15 1943     Chief Complaint  Patient presents with  . Allergic Reaction     (Consider location/radiation/quality/duration/timing/severity/associated sxs/prior Treatment) HPI Tammie Martinez is a 24 y.o. female who comes in for evaluation of allergic reaction. Patient states she was started on Bactrim on Sunday for a mastitis infection. She reports in the following days she became increasingly nauseous and was "vomiting all of yesterday", nonbloody and nonbilious. She reports a rash erupted today that is red, itchy and "all over my body". She denies any difficulties breathing, shortness of breath, abdominal pain, difficulty swallowing. Reports only an allergy to penicillin. No other aggravating or modifying factors.  History reviewed. No pertinent past medical history. History reviewed. No pertinent past surgical history. Family History  Problem Relation Age of Onset  . Heart disease Father   . Hypertension Maternal Grandfather   . COPD Maternal Grandfather    History  Substance Use Topics  . Smoking status: Current Every Day Smoker  . Smokeless tobacco: Never Used  . Alcohol Use: 4.6 oz/week    4 Shots of liquor, 2 Standard drinks or equivalent, 2 Cans of beer per week   OB History    Gravida Para Term Preterm AB TAB SAB Ectopic Multiple Living   2 1 1  1  1   1      Review of Systems A 10 point review of systems was completed and was negative except for pertinent positives and negatives as mentioned in the history of present illness     Allergies  Penicillins and Sulfa antibiotics  Home Medications   Prior to Admission medications   Medication Sig Start Date End Date Taking? Authorizing Provider  ibuprofen (ADVIL,MOTRIN) 800 MG tablet Take 1 tablet (800 mg total) by mouth every 8 (eight) hours as needed for moderate pain. 04/05/15  Yes Dorathy Kinsman, CNM  nystatin  cream (MYCOSTATIN) Apply 1 application topically 2 (two) times daily. 04/08/15  Yes Brock Bad, MD  cephALEXin (KEFLEX) 500 MG capsule Take 1 capsule (500 mg total) by mouth 4 (four) times daily. 04/11/15   Joycie Peek, PA-C  fluconazole (DIFLUCAN) 200 MG tablet 1 tablet by mouth every other day. Patient not taking: Reported on 04/11/2015 04/08/15   Brock Bad, MD  predniSONE (DELTASONE) 20 MG tablet 3 tabs po day one, then 2 tabs daily x 4 days 04/11/15   Joycie Peek, PA-C  sulfamethoxazole-trimethoprim (BACTRIM DS,SEPTRA DS) 800-160 MG per tablet Take 1 tablet by mouth 2 (two) times daily. Patient not taking: Reported on 04/11/2015 04/08/15   Brock Bad, MD   BP 109/55 mmHg  Pulse 65  Temp(Src) 98.8 F (37.1 C) (Oral)  Resp 18  SpO2 99%  LMP 03/25/2015 Physical Exam  Constitutional: She is oriented to person, place, and time. She appears well-developed and well-nourished.  HENT:  Head: Normocephalic and atraumatic.  Mouth/Throat: Oropharynx is clear and moist.  Eyes: Conjunctivae are normal. Pupils are equal, round, and reactive to light. Right eye exhibits no discharge. Left eye exhibits no discharge. No scleral icterus.  Neck: Neck supple.  Cardiovascular: Normal rate, regular rhythm and normal heart sounds.   Pulmonary/Chest: Effort normal and breath sounds normal. No respiratory distress. She has no wheezes. She has no rales.  Abdominal: Soft. There is no tenderness.  Musculoskeletal: She exhibits no tenderness.  Neurological: She is alert and oriented to  person, place, and time.  Cranial Nerves II-XII grossly intact  Skin: Skin is warm and dry.  Diffuse maculopapular rash throughout extremities, trunk, abdomen chest and back. No petechiae. No sloughing or scaling.  Psychiatric: She has a normal mood and affect.  Nursing note and vitals reviewed.   ED Course  Procedures (including critical care time) Labs Review Labs Reviewed - No data to  display  Imaging Review No results found.   EKG Interpretation None     Meds given in ED:  Medications  methylPREDNISolone sodium succinate (SOLU-MEDROL) 125 mg/2 mL injection 125 mg (125 mg Intravenous Given 04/11/15 2028)  famotidine (PEPCID) IVPB 20 mg premix (0 mg Intravenous Stopped 04/11/15 2100)  diphenhydrAMINE (BENADRYL) injection 25 mg (25 mg Intravenous Given 04/11/15 2027)    Discharge Medication List as of 04/11/2015 10:43 PM    START taking these medications   Details  cephALEXin (KEFLEX) 500 MG capsule Take 1 capsule (500 mg total) by mouth 4 (four) times daily., Starting 04/11/2015, Until Discontinued, Print    predniSONE (DELTASONE) 20 MG tablet 3 tabs po day one, then 2 tabs daily x 4 days, Print       Filed Vitals:   04/11/15 1938 04/11/15 2254  BP: 109/63 109/55  Pulse: 78 65  Temp: 98.8 F (37.1 C)   TempSrc: Oral   Resp: 18 18  SpO2: 100% 99%    MDM  Vitals stable - WNL -afebrile Pt resting comfortably in ED. PE--Diffuse mac/pap rash w/o any petichiae. No evidence of SJS or TENs. Normal Lung and Abd exams. No evidence of anaphylaxis. Feels beter after steroids/pepcid/benadryl.  DDX--patient with allergic reaction possibly to sulfa antibiotic. No evidence of anaphylaxis, respiratory compromise, meningeal pathology at this time. Will change antibiotic. Also DC with prednisone taper. Encourage follow-up with PCP for further evaluation and management of symptoms.  I discussed all relevant lab findings and imaging results with pt and they verbalized understanding. Discussed f/u with PCP within 48 hrs and return precautions, pt very amenable to plan. .Prior to patient discharge, I discussed and reviewed this case with Dr.Harrison   Final diagnoses:  Allergic reaction, initial encounter       Joycie Peek, PA-C 04/13/15 1047  Purvis Sheffield, MD 04/14/15 0001

## 2015-04-11 NOTE — Discharge Instructions (Signed)
You were evaluated in the ED for your allergic reaction. Please stop taking your sulfa antibiotic. Please take the Keflex antibiotic instead. Also take your prednisone as directed. Follow-up with your primary care. Return to ED for worsening symptoms.  Allergies Allergies may happen from anything your body is sensitive to. This may be food, medicines, pollens, chemicals, and nearly anything around you in everyday life that produces allergens. An allergen is anything that causes an allergy producing substance. Heredity is often a factor in causing these problems. This means you may have some of the same allergies as your parents. Food allergies happen in all age groups. Food allergies are some of the most severe and life threatening. Some common food allergies are cow's milk, seafood, eggs, nuts, wheat, and soybeans. SYMPTOMS   Swelling around the mouth.  An itchy red rash or hives.  Vomiting or diarrhea.  Difficulty breathing. SEVERE ALLERGIC REACTIONS ARE LIFE-THREATENING. This reaction is called anaphylaxis. It can cause the mouth and throat to swell and cause difficulty with breathing and swallowing. In severe reactions only a trace amount of food (for example, peanut oil in a salad) may cause death within seconds. Seasonal allergies occur in all age groups. These are seasonal because they usually occur during the same season every year. They may be a reaction to molds, grass pollens, or tree pollens. Other causes of problems are house dust mite allergens, pet dander, and mold spores. The symptoms often consist of nasal congestion, a runny itchy nose associated with sneezing, and tearing itchy eyes. There is often an associated itching of the mouth and ears. The problems happen when you come in contact with pollens and other allergens. Allergens are the particles in the air that the body reacts to with an allergic reaction. This causes you to release allergic antibodies. Through a chain of events,  these eventually cause you to release histamine into the blood stream. Although it is meant to be protective to the body, it is this release that causes your discomfort. This is why you were given anti-histamines to feel better. If you are unable to pinpoint the offending allergen, it may be determined by skin or blood testing. Allergies cannot be cured but can be controlled with medicine. Hay fever is a collection of all or some of the seasonal allergy problems. It may often be treated with simple over-the-counter medicine such as diphenhydramine. Take medicine as directed. Do not drink alcohol or drive while taking this medicine. Check with your caregiver or package insert for child dosages. If these medicines are not effective, there are many new medicines your caregiver can prescribe. Stronger medicine such as nasal spray, eye drops, and corticosteroids may be used if the first things you try do not work well. Other treatments such as immunotherapy or desensitizing injections can be used if all else fails. Follow up with your caregiver if problems continue. These seasonal allergies are usually not life threatening. They are generally more of a nuisance that can often be handled using medicine. HOME CARE INSTRUCTIONS   If unsure what causes a reaction, keep a diary of foods eaten and symptoms that follow. Avoid foods that cause reactions.  If hives or rash are present:  Take medicine as directed.  You may use an over-the-counter antihistamine (diphenhydramine) for hives and itching as needed.  Apply cold compresses (cloths) to the skin or take baths in cool water. Avoid hot baths or showers. Heat will make a rash and itching worse.  If you are  severely allergic:  Following a treatment for a severe reaction, hospitalization is often required for closer follow-up.  Wear a medic-alert bracelet or necklace stating the allergy.  You and your family must learn how to give adrenaline or use an  anaphylaxis kit.  If you have had a severe reaction, always carry your anaphylaxis kit or EpiPen with you. Use this medicine as directed by your caregiver if a severe reaction is occurring. Failure to do so could have a fatal outcome. SEEK MEDICAL CARE IF:  You suspect a food allergy. Symptoms generally happen within 30 minutes of eating a food.  Your symptoms have not gone away within 2 days or are getting worse.  You develop new symptoms.  You want to retest yourself or your child with a food or drink you think causes an allergic reaction. Never do this if an anaphylactic reaction to that food or drink has happened before. Only do this under the care of a caregiver. SEEK IMMEDIATE MEDICAL CARE IF:   You have difficulty breathing, are wheezing, or have a tight feeling in your chest or throat.  You have a swollen mouth, or you have hives, swelling, or itching all over your body.  You have had a severe reaction that has responded to your anaphylaxis kit or an EpiPen. These reactions may return when the medicine has worn off. These reactions should be considered life threatening. MAKE SURE YOU:   Understand these instructions.  Will watch your condition.  Will get help right away if you are not doing well or get worse. Document Released: 11/29/2002 Document Revised: 12/31/2012 Document Reviewed: 05/05/2008 [facility] Patient Information 2015 [place], [place]. This information is not intended to replace advice given to you by your health care provider. Make sure you discuss any questions you have with your health care provider.  Drug Allergy Allergic reactions to medicines are common. Some allergic reactions are mild. A delayed type of drug allergy that occurs 1 week or more after exposure to a medicine or vaccine is called serum sickness. A life-threatening, sudden (acute) allergic reaction that involves the whole body is called anaphylaxis. CAUSES  "True" drug allergies occur when there  is an allergic reaction to a medicine. This is caused by overactivity of the immune system. First, the body becomes sensitized. The immune system is triggered by your first exposure to the medicine. Following this first exposure, future exposure to the same medicine may be life-threatening. Almost any medicine can cause an allergic reaction. Common ones are:  Penicillin.  Sulfonamides (sulfa drugs).  Local anesthetics.  X-ray dyes that contain iodine. SYMPTOMS  Common symptoms of a minor allergic reaction are:  Swelling around the mouth.  An itchy red rash or hives.  Vomiting or diarrhea. Anaphylaxis can cause swelling of the mouth and throat. This makes it difficult to breathe and swallow. Severe reactions can be fatal within seconds, even after exposure to only a trace amount of the drug that causes the reaction. HOME CARE INSTRUCTIONS   If you are unsure of what caused your reaction, keep a diary of foods and medicines used. Include the symptoms that followed. Avoid anything that causes reactions.  You may want to follow up with an allergy specialist after the reaction has cleared in order to be tested to confirm the allergy. It is important to confirm that your reaction is an allergy, not just a side effect to the medicine. If you have a true allergy to a medicine, this may prevent that medicine  and related medicines from being given to you when you are very ill.  If you have hives or a rash:  Take medicines as directed by your caregiver.  You may use an over-the-counter antihistamine (diphenhydramine) as needed.  Apply cold compresses to the skin or take baths in cool water. Avoid hot baths or showers.  If you are severely allergic:  Continuous observation after a severe reaction may be needed. Hospitalization is often required.  Wear a medical alert bracelet or necklace stating your allergy.  You and your family must learn how to use an anaphylaxis kit or give an  epinephrine injection to temporarily treat an emergency allergic reaction. If you have had a severe reaction, always carry your epinephrine injection or anaphylaxis kit with you. This can be lifesaving if you have a severe reaction.  Do not drive or perform tasks after treatment until the medicines used to treat your reaction have worn off, or until your caregiver says it is okay. SEEK MEDICAL CARE IF:   You think you had an allergic reaction. Symptoms usually start within 30 minutes after exposure.  Symptoms are getting worse rather than better.  You develop new symptoms.  The symptoms that brought you to your caregiver return. SEEK IMMEDIATE MEDICAL CARE IF:   You have swelling of the mouth, difficulty breathing, or wheezing.  You have a tight feeling in your chest or throat.  You develop hives, swelling, or itching all over your body.  You develop severe vomiting or diarrhea.  You feel faint or pass out. This is an emergency. Use your epinephrine injection or anaphylaxis kit as you have been instructed. Call for emergency medical help. Even if you improve after the injection, you need to be examined at a hospital emergency department. MAKE SURE YOU:   Understand these instructions.  Will watch your condition.  Will get help right away if you are not doing well or get worse. Document Released: 09/05/2005 Document Revised: 11/28/2011 Document Reviewed: 02/09/2011 [facility] Patient Information 2015 [place], [place]. This information is not intended to replace advice given to you by your health care provider. Make sure you discuss any questions you have with your health care provider.

## 2015-04-11 NOTE — ED Notes (Addendum)
Pt from home c/o itchy red rash all over body and at one point felt as if her throat was closing so she took benadryl.. She first noticed the rash this am. She reports she has been taking sulfa antibiotic since Sunday. Airway intact with clear lung sounds bilaterally.

## 2015-04-22 ENCOUNTER — Ambulatory Visit (INDEPENDENT_AMBULATORY_CARE_PROVIDER_SITE_OTHER): Payer: Medicaid Other | Admitting: Obstetrics

## 2015-04-22 ENCOUNTER — Encounter: Payer: Self-pay | Admitting: Obstetrics

## 2015-04-22 VITALS — BP 103/59 | HR 71 | Temp 97.8°F | Ht 64.0 in | Wt 130.8 lb

## 2015-04-22 DIAGNOSIS — Z0189 Encounter for other specified special examinations: Secondary | ICD-10-CM

## 2015-04-22 DIAGNOSIS — N61 Inflammatory disorders of breast: Secondary | ICD-10-CM

## 2015-04-22 DIAGNOSIS — N611 Abscess of the breast and nipple: Secondary | ICD-10-CM

## 2015-04-22 LAB — HIV ANTIBODY (ROUTINE TESTING W REFLEX): HIV 1&2 Ab, 4th Generation: NONREACTIVE

## 2015-04-23 ENCOUNTER — Encounter: Payer: Self-pay | Admitting: Obstetrics

## 2015-04-23 LAB — HEPATITIS B SURFACE ANTIGEN: Hepatitis B Surface Ag: NEGATIVE

## 2015-04-23 LAB — RPR

## 2015-04-23 LAB — HEPATITIS C ANTIBODY: HCV AB: NEGATIVE

## 2015-04-23 NOTE — Progress Notes (Signed)
Patient ID: Tammie Martinez, female   DOB: 12-16-1990, 24 y.o.   MRN: [id number]  Chief Complaint  Patient presents with  . Follow-up    breast exam    HPI Tammie Martinez is a 24 y.o. female.  Follow up of breast abscess, treated.  Patient states there is no drainage around nipple now and the area is drying up and is nontender.  HPI  History reviewed. No pertinent past medical history.  History reviewed. No pertinent past surgical history.  Family History  Problem Relation Age of Onset  . Heart disease Father   . Hypertension Maternal Grandfather   . COPD Maternal Grandfather     Social History History  Substance Use Topics  . Smoking status: Current Every Day Smoker  . Smokeless tobacco: Never Used  . Alcohol Use: 4.6 oz/week    2 Cans of beer, 4 Shots of liquor, 2 Standard drinks or equivalent per week    Allergies  Allergen Reactions  . Penicillins Hives  . Sulfa Antibiotics Hives    Current Outpatient Prescriptions  Medication Sig Dispense Refill  . ibuprofen (ADVIL,MOTRIN) 800 MG tablet Take 1 tablet (800 mg total) by mouth every 8 (eight) hours as needed for moderate pain. 30 tablet 2  . nystatin cream (MYCOSTATIN) Apply 1 application topically 2 (two) times daily. 30 g 0  . cephALEXin (KEFLEX) 500 MG capsule Take 1 capsule (500 mg total) by mouth 4 (four) times daily. (Patient not taking: Reported on 04/22/2015) 40 capsule 0  . fluconazole (DIFLUCAN) 200 MG tablet 1 tablet by mouth every other day. (Patient not taking: Reported on 04/11/2015) 3 tablet 2  . predniSONE (DELTASONE) 20 MG tablet 3 tabs po day one, then 2 tabs daily x 4 days (Patient not taking: Reported on 04/22/2015) 11 tablet 0  . sulfamethoxazole-trimethoprim (BACTRIM DS,SEPTRA DS) 800-160 MG per tablet Take 1 tablet by mouth 2 (two) times daily. (Patient not taking: Reported on 04/11/2015) 14 tablet 1   No current facility-administered medications for this visit.    Review of Systems Review of  Systems Constitutional: negative for fatigue and weight loss Respiratory: negative for cough and wheezing Cardiovascular: negative for chest pain, fatigue and palpitations Gastrointestinal: negative for abdominal pain and change in bowel habits Genitourinary:negative Integument/breast: negative for nipple discharge Musculoskeletal:negative for myalgias Neurological: negative for gait problems and tremors Behavioral/Psych: negative for abusive relationship, depression Endocrine: negative for temperature intolerance     Blood pressure 103/59, pulse 71, temperature 97.8 F (36.6 C), height  (1.626 m), weight 130 lb 12.8 oz (59.33 kg), last menstrual period 03/25/2015.  Physical Exam Physical Exam      General:  Alert and no distress.      Breast:  Right breast nipple clean, dry and NT.  Data Reviewed Labs Reviewed notes from evaluation and treatment at [facility]  Assessment     Breast nipple abscess - Right     Plan    Continue antibiotics F/U in 2 weeks.   Orders Placed This Encounter  Procedures  . SureSwab, Vaginosis/Vaginitis Plus  . HIV antibody  . Hepatitis B surface antigen  . RPR  . Hepatitis C antibody   No orders of the defined types were placed in this encounter.

## 2015-04-30 ENCOUNTER — Ambulatory Visit: Payer: Medicaid Other | Admitting: Obstetrics

## 2015-05-01 LAB — SURESWAB, VAGINOSIS/VAGINITIS PLUS
Atopobium vaginae: NOT DETECTED Log (cells/mL)
BV CATEGORY: UNDETERMINED — AB
C. ALBICANS, DNA: DETECTED — AB
C. GLABRATA, DNA: NOT DETECTED
C. TROPICALIS, DNA: NOT DETECTED
C. parapsilosis, DNA: NOT DETECTED
C. trachomatis RNA, TMA: NOT DETECTED
Gardnerella vaginalis: >8 Log (cells/mL)
LACTOBACILLUS SPECIES: 7.9 Log (cells/mL)
MEGASPHAERA SPECIES: NOT DETECTED Log (cells/mL)
N. gonorrhoeae RNA, TMA: NOT DETECTED

## 2015-05-02 ENCOUNTER — Other Ambulatory Visit: Payer: Self-pay | Admitting: Obstetrics

## 2015-05-02 DIAGNOSIS — B9689 Other specified bacterial agents as the cause of diseases classified elsewhere: Secondary | ICD-10-CM

## 2015-05-02 DIAGNOSIS — B373 Candidiasis of vulva and vagina: Secondary | ICD-10-CM

## 2015-05-02 DIAGNOSIS — N760 Acute vaginitis: Principal | ICD-10-CM

## 2015-05-02 DIAGNOSIS — B3731 Acute candidiasis of vulva and vagina: Secondary | ICD-10-CM

## 2015-05-02 MED ORDER — FLUCONAZOLE 200 MG PO TABS: ORAL_TABLET | ORAL | Status: DC

## 2015-05-02 MED ORDER — TINIDAZOLE 500 MG PO TABS: 1000.0000 mg | ORAL_TABLET | Freq: Every day | ORAL | Status: DC

## 2015-05-13 ENCOUNTER — Other Ambulatory Visit: Payer: Self-pay | Admitting: *Deleted

## 2015-05-13 ENCOUNTER — Encounter: Payer: Self-pay | Admitting: *Deleted

## 2015-05-13 ENCOUNTER — Telehealth: Payer: Self-pay | Admitting: *Deleted

## 2015-05-13 DIAGNOSIS — B9689 Other specified bacterial agents as the cause of diseases classified elsewhere: Secondary | ICD-10-CM

## 2015-05-13 DIAGNOSIS — N760 Acute vaginitis: Principal | ICD-10-CM

## 2015-05-13 MED ORDER — METRONIDAZOLE 500 MG PO TABS: 500.0000 mg | ORAL_TABLET | Freq: Two times a day (BID) | ORAL | Status: DC

## 2015-05-13 NOTE — Progress Notes (Signed)
Change in Rx due to insurance, flagyl sent to pharmacy.

## 2015-05-13 NOTE — Telephone Encounter (Signed)
Patient states she has BV and she wants medication. Patient complains of irritation and odor.Rx for Flagyl refilled and patient instructed to call if she does not improve.

## 2015-05-27 ENCOUNTER — Telehealth: Payer: Self-pay

## 2015-05-27 NOTE — Telephone Encounter (Signed)
Lab results and letter  mailed to patient's address on 05/13/15, were returned to [facility] as undeliverable

## 2015-08-17 ENCOUNTER — Encounter: Payer: Self-pay | Admitting: Certified Nurse Midwife

## 2015-08-17 ENCOUNTER — Ambulatory Visit (INDEPENDENT_AMBULATORY_CARE_PROVIDER_SITE_OTHER): Payer: Medicaid Other | Admitting: Certified Nurse Midwife

## 2015-08-17 VITALS — BP 111/73 | HR 68 | Temp 99.0°F | Ht 64.5 in | Wt 129.0 lb

## 2015-08-17 DIAGNOSIS — N760 Acute vaginitis: Secondary | ICD-10-CM | POA: Diagnosis not present

## 2015-08-17 DIAGNOSIS — A499 Bacterial infection, unspecified: Secondary | ICD-10-CM

## 2015-08-17 DIAGNOSIS — B373 Candidiasis of vulva and vagina: Secondary | ICD-10-CM

## 2015-08-17 DIAGNOSIS — Z3202 Encounter for pregnancy test, result negative: Secondary | ICD-10-CM | POA: Diagnosis not present

## 2015-08-17 DIAGNOSIS — B9689 Other specified bacterial agents as the cause of diseases classified elsewhere: Secondary | ICD-10-CM

## 2015-08-17 DIAGNOSIS — N926 Irregular menstruation, unspecified: Secondary | ICD-10-CM | POA: Diagnosis not present

## 2015-08-17 DIAGNOSIS — B3731 Acute candidiasis of vulva and vagina: Secondary | ICD-10-CM

## 2015-08-17 LAB — POCT URINE PREGNANCY: Preg Test, Ur: NEGATIVE

## 2015-08-17 MED ORDER — FLUCONAZOLE 100 MG PO TABS: 100.0000 mg | ORAL_TABLET | Freq: Once | ORAL | Status: DC

## 2015-08-17 MED ORDER — TERCONAZOLE 0.4 % VA CREA: 1.0000 | TOPICAL_CREAM | Freq: Every day | VAGINAL | Status: DC

## 2015-08-17 MED ORDER — TINIDAZOLE 500 MG PO TABS: 2.0000 g | ORAL_TABLET | Freq: Every day | ORAL | Status: AC

## 2015-08-17 NOTE — Addendum Note (Signed)
Addended by: Marya LandryFOSTER, Nahal Wanless D on: 08/17/2015 03:23 PM   Modules accepted: Orders

## 2015-08-17 NOTE — Progress Notes (Signed)
Patient ID: Tammie Martinez, female   DOB: 1990-11-17, 24 y.o.   MRN: [id number]   Chief Complaint  Patient presents with  . Vaginitis    Discahrge with odor and itching    HPI Tammie Martinez is a 24 y.o. female.  Here for missed period.  Has had regular cycles until now.  Hx of NSVD in 2011.  Tried Depo in 2014 for 2 injections, lost weight and had hair loss.  Is not currently on birth control and is sexually active.  Was due for cycle around the start of November.  Had a regular period in October.  States that her periods last about 2 weeks.  Desires birth control.  Also, has had vaginal discharge with odor and itching for several days. Has not tried anything for either problem.     HPI  History reviewed. No pertinent past medical history.  History reviewed. No pertinent past surgical history.  Family History  Problem Relation Age of Onset  . Heart disease Father   . Hypertension Maternal Grandfather   . COPD Maternal Grandfather     Social History Social History  Substance Use Topics  . Smoking status: Current Every Day Smoker  . Smokeless tobacco: Never Used  . Alcohol Use: 4.2 oz/week    7 Glasses of wine per week    Allergies  Allergen Reactions  . Penicillins Hives  . Sulfa Antibiotics Hives    Current Outpatient Prescriptions  Medication Sig Dispense Refill  . fluconazole (DIFLUCAN) 100 MG tablet Take 1 tablet (100 mg total) by mouth once. Repeat dose in 48-72 hour. 3 tablet 0  . terconazole (TERAZOL 7) 0.4 % vaginal cream Place 1 applicator vaginally at bedtime. 45 g 0  . tinidazole (TINDAMAX) 500 MG tablet Take 4 tablets (2,000 mg total) by mouth daily with breakfast. 12 tablet 0   No current facility-administered medications for this visit.    Review of Systems Review of Systems Constitutional: negative for fatigue and weight loss Respiratory: negative for cough and wheezing Cardiovascular: negative for chest pain, fatigue and palpitations Gastrointestinal:  negative for abdominal pain and change in bowel habits Genitourinary:negative Integument/breast: negative for nipple discharge Musculoskeletal:negative for myalgias Neurological: negative for gait problems and tremors Behavioral/Psych: negative for abusive relationship, depression Endocrine: negative for temperature intolerance     Blood pressure 111/73, pulse 68, temperature 99 F (37.2 C), height 5' 4.5" (1.638 m), weight 129 lb (58.514 kg), last menstrual period 06/25/2015.  Physical Exam Physical Exam General:   alert  Skin:   no rash or abnormalities  Lungs:   clear to auscultation bilaterally  Heart:   regular rate and rhythm, S1, S2 normal, no murmur, click, rub or gallop  Breasts:   normal without suspicious masses, skin or nipple changes or axillary nodes  Abdomen:  normal findings: no organomegaly, soft, non-tender and no hernia  Pelvis:  External genitalia: normal general appearance Urinary system: urethral meatus normal and bladder without fullness, nontender Vaginal: normal without tenderness, induration or masses Cervix: normal appearance Adnexa: normal bimanual exam Uterus: anteverted and non-tender, normal size    50% of 15 min visit spent on counseling and coordination of care.   Data Reviewed Previous medical hx, labs, meds  Assessment     Unprotected sexual intercourse Contraception counseling  BV Vulvovaginal candidasis    Plan    Orders Placed This Encounter  Procedures  . hCG, quantitative, pregnancy   Meds ordered this encounter  Medications  . tinidazole (TINDAMAX) 500 MG tablet  Sig: Take 4 tablets (2,000 mg total) by mouth daily with breakfast.    Dispense:  12 tablet    Refill:  0  . terconazole (TERAZOL 7) 0.4 % vaginal cream    Sig: Place 1 applicator vaginally at bedtime.    Dispense:  45 g    Refill:  0  . fluconazole (DIFLUCAN) 100 MG tablet    Sig: Take 1 tablet (100 mg total) by mouth once. Repeat dose in 48-72 hour.     Dispense:  3 tablet    Refill:  0     Possible management options include:pelvic US or OCPs until next period Follow up with Mirena IUD with next period.

## 2015-08-17 NOTE — Addendum Note (Signed)
Addended by: Henriette CombsHATTON, Everest Brod L on: 08/17/2015 02:41 PM   Modules accepted: Orders

## 2015-08-18 LAB — HCG, QUANTITATIVE, PREGNANCY

## 2015-08-19 ENCOUNTER — Telehealth: Payer: Self-pay | Admitting: *Deleted

## 2015-08-19 NOTE — Telephone Encounter (Signed)
Pt called for lab results. Return call, pt made aware of pregnancy quant lab.  Pt made aware Swab not yet resulted.

## 2015-08-20 ENCOUNTER — Other Ambulatory Visit: Payer: Self-pay | Admitting: Certified Nurse Midwife

## 2015-08-20 DIAGNOSIS — B373 Candidiasis of vulva and vagina: Secondary | ICD-10-CM

## 2015-08-20 DIAGNOSIS — N760 Acute vaginitis: Principal | ICD-10-CM

## 2015-08-20 DIAGNOSIS — B3731 Acute candidiasis of vulva and vagina: Secondary | ICD-10-CM

## 2015-08-20 DIAGNOSIS — B9689 Other specified bacterial agents as the cause of diseases classified elsewhere: Secondary | ICD-10-CM

## 2015-08-20 LAB — SURESWAB, VAGINOSIS/VAGINITIS PLUS
Atopobium vaginae: 6 Log (cells/mL)
BV CATEGORY: UNDETERMINED — AB
C. PARAPSILOSIS, DNA: NOT DETECTED
C. TROPICALIS, DNA: NOT DETECTED
C. albicans, DNA: DETECTED — AB
C. glabrata, DNA: NOT DETECTED
C. trachomatis RNA, TMA: NOT DETECTED
Gardnerella vaginalis: >8 Log (cells/mL)
LACTOBACILLUS SPECIES: 5.8 Log (cells/mL)
MEGASPHAERA SPECIES: >8 Log (cells/mL)
N. GONORRHOEAE RNA, TMA: NOT DETECTED
T. vaginalis RNA, QL TMA: NOT DETECTED

## 2015-08-20 MED ORDER — METRONIDAZOLE 500 MG PO TABS: 500.0000 mg | ORAL_TABLET | Freq: Two times a day (BID) | ORAL | Status: DC

## 2015-08-20 MED ORDER — FLUCONAZOLE 100 MG PO TABS: 100.0000 mg | ORAL_TABLET | Freq: Once | ORAL | Status: DC

## 2015-08-20 MED ORDER — TERCONAZOLE 0.4 % VA CREA: 1.0000 | TOPICAL_CREAM | Freq: Every day | VAGINAL | Status: DC

## 2015-09-01 ENCOUNTER — Encounter: Payer: Self-pay | Admitting: *Deleted

## 2015-10-09 ENCOUNTER — Other Ambulatory Visit: Payer: Self-pay | Admitting: Certified Nurse Midwife

## 2015-10-09 ENCOUNTER — Emergency Department (HOSPITAL_COMMUNITY): Payer: Medicaid Other

## 2015-10-09 ENCOUNTER — Encounter (HOSPITAL_COMMUNITY): Payer: Self-pay | Admitting: *Deleted

## 2015-10-09 DIAGNOSIS — Z880 Allergy status to penicillin: Secondary | ICD-10-CM | POA: Insufficient documentation

## 2015-10-09 DIAGNOSIS — Z792 Long term (current) use of antibiotics: Secondary | ICD-10-CM | POA: Diagnosis not present

## 2015-10-09 DIAGNOSIS — N898 Other specified noninflammatory disorders of vagina: Secondary | ICD-10-CM | POA: Diagnosis not present

## 2015-10-09 DIAGNOSIS — F17200 Nicotine dependence, unspecified, uncomplicated: Secondary | ICD-10-CM | POA: Insufficient documentation

## 2015-10-09 DIAGNOSIS — Z3202 Encounter for pregnancy test, result negative: Secondary | ICD-10-CM | POA: Diagnosis not present

## 2015-10-09 DIAGNOSIS — N644 Mastodynia: Secondary | ICD-10-CM | POA: Diagnosis not present

## 2015-10-09 DIAGNOSIS — Z79899 Other long term (current) drug therapy: Secondary | ICD-10-CM | POA: Diagnosis not present

## 2015-10-09 DIAGNOSIS — R079 Chest pain, unspecified: Secondary | ICD-10-CM | POA: Insufficient documentation

## 2015-10-09 LAB — CBC
HCT: 38.6 % (ref 36.0–46.0)
Hemoglobin: 12.4 g/dL (ref 12.0–15.0)
MCH: 28.3 pg (ref 26.0–34.0)
MCHC: 32.1 g/dL (ref 30.0–36.0)
MCV: 88.1 fL (ref 78.0–100.0)
Platelets: 315 10*3/uL (ref 150–400)
RBC: 4.38 MIL/uL (ref 3.87–5.11)
RDW: 13.1 % (ref 11.5–15.5)
WBC: 8 10*3/uL (ref 4.0–10.5)

## 2015-10-09 LAB — I-STAT TROPONIN, ED: TROPONIN I, POC: 0 ng/mL (ref 0.00–0.08)

## 2015-10-09 LAB — I-STAT BETA HCG BLOOD, ED (MC, WL, AP ONLY): I-stat hCG, quantitative: <5 m[IU]/mL (ref <5)

## 2015-10-09 NOTE — ED Notes (Signed)
Pt reports intermittent left and right chest pain for two weeks. Pt states pain feels like a crushing feeling, pain occurs mainly at night and radiates down to her stomach. Pt also reports some light headedness when she stands up with the pain.

## 2015-10-10 ENCOUNTER — Emergency Department (HOSPITAL_COMMUNITY)
Admission: EM | Admit: 2015-10-10 | Discharge: 2015-10-10 | Disposition: A | Discharge status: Home / Self Care | Payer: Medicaid Other | Attending: Emergency Medicine | Admitting: Emergency Medicine

## 2015-10-10 DIAGNOSIS — R079 Chest pain, unspecified: Secondary | ICD-10-CM

## 2015-10-10 DIAGNOSIS — N898 Other specified noninflammatory disorders of vagina: Secondary | ICD-10-CM

## 2015-10-10 DIAGNOSIS — N644 Mastodynia: Secondary | ICD-10-CM

## 2015-10-10 LAB — COMPREHENSIVE METABOLIC PANEL
ALBUMIN: 4 g/dL (ref 3.5–5.0)
ALK PHOS: 55 U/L (ref 38–126)
ALT: 16 U/L (ref 14–54)
ANION GAP: 10 (ref 5–15)
AST: 19 U/L (ref 15–41)
BILIRUBIN TOTAL: 0.8 mg/dL (ref 0.3–1.2)
BUN: 10 mg/dL (ref 6–20)
CALCIUM: 9.5 mg/dL (ref 8.9–10.3)
CO2: 27 mmol/L (ref 22–32)
Chloride: 105 mmol/L (ref 101–111)
Creatinine, Ser: 0.75 mg/dL (ref 0.44–1.00)
GFR calc Af Amer: >60 mL/min (ref >60)
GFR calc non Af Amer: >60 mL/min (ref >60)
Glucose, Bld: 87 mg/dL (ref 65–99)
Potassium: 4 mmol/L (ref 3.5–5.1)
Sodium: 142 mmol/L (ref 135–145)
TOTAL PROTEIN: 7.1 g/dL (ref 6.5–8.1)

## 2015-10-10 LAB — URINALYSIS, ROUTINE W REFLEX MICROSCOPIC
Bilirubin Urine: NEGATIVE
Glucose, UA: NEGATIVE mg/dL
HGB URINE DIPSTICK: NEGATIVE
KETONES UR: 15 mg/dL — AB
Nitrite: NEGATIVE
PROTEIN: NEGATIVE mg/dL
Specific Gravity, Urine: 1.03 (ref 1.005–1.030)
pH: 6 (ref 5.0–8.0)

## 2015-10-10 LAB — URINE MICROSCOPIC-ADD ON: RBC / HPF: NONE SEEN RBC/hpf (ref 0–5)

## 2015-10-10 LAB — LIPASE, BLOOD: Lipase: 21 U/L (ref 11–51)

## 2015-10-10 MED ORDER — METRONIDAZOLE 500 MG PO TABS: 500.0000 mg | ORAL_TABLET | Freq: Two times a day (BID) | ORAL | Status: DC

## 2015-10-10 MED ORDER — IBUPROFEN 600 MG PO TABS: 600.0000 mg | ORAL_TABLET | Freq: Three times a day (TID) | ORAL | Status: DC | PRN

## 2015-10-10 NOTE — Discharge Instructions (Signed)
Nonspecific Chest Pain  °Chest pain can be caused by many different conditions. There is always a chance that your pain could be related to something serious, such as a heart attack or a blood clot in your lungs. Chest pain can also be caused by conditions that are not life-threatening. If you have chest pain, it is very important to follow up with your health care provider. °CAUSES  °Chest pain can be caused by: °· Heartburn. °· Pneumonia or bronchitis. °· Anxiety or stress. °· Inflammation around your heart (pericarditis) or lung (pleuritis or pleurisy). °· A blood clot in your lung. °· A collapsed lung (pneumothorax). It can develop suddenly on its own (spontaneous pneumothorax) or from trauma to the chest. °· Shingles infection (varicella-zoster virus). °· Heart attack. °· Damage to the bones, muscles, and cartilage that make up your chest wall. This can include: °¨ Bruised bones due to injury. °¨ Strained muscles or cartilage due to frequent or repeated coughing or overwork. °¨ Fracture to one or more ribs. °¨ Sore cartilage due to inflammation (costochondritis). °RISK FACTORS  °Risk factors for chest pain may include: °· Activities that increase your risk for trauma or injury to your chest. °· Respiratory infections or conditions that cause frequent coughing. °· Medical conditions or overeating that can cause heartburn. °· Heart disease or family history of heart disease. °· Conditions or health behaviors that increase your risk of developing a blood clot. °· Having had chicken pox (varicella zoster). °SIGNS AND SYMPTOMS °Chest pain can feel like: °· Burning or tingling on the surface of your chest or deep in your chest. °· Crushing, pressure, aching, or squeezing pain. °· Dull or sharp pain that is worse when you move, cough, or take a deep breath. °· Pain that is also felt in your back, neck, shoulder, or arm, or pain that spreads to any of these areas. °Your chest pain may come and go, or it may stay  constant. °DIAGNOSIS °Lab tests or other studies may be needed to find the cause of your pain. Your health care provider may have you take a test called an ambulatory ECG (electrocardiogram). An ECG records your heartbeat patterns at the time the test is performed. You may also have other tests, such as: °· Transthoracic echocardiogram (TTE). During echocardiography, sound waves are used to create a picture of all of the heart structures and to look at how blood flows through your heart. °· Transesophageal echocardiogram (TEE). This is a more advanced imaging test that obtains images from inside your body. It allows your health care provider to see your heart in finer detail. °· Cardiac monitoring. This allows your health care provider to monitor your heart rate and rhythm in real time. °· Holter monitor. This is a portable device that records your heartbeat and can help to diagnose abnormal heartbeats. It allows your health care provider to track your heart activity for several days, if needed. °· Stress tests. These can be done through exercise or by taking medicine that makes your heart beat more quickly. °· Blood tests. °· Imaging tests. °TREATMENT  °Your treatment depends on what is causing your chest pain. Treatment may include: °· Medicines. These may include: °¨ Acid blockers for heartburn. °¨ Anti-inflammatory medicine. °¨ Pain medicine for inflammatory conditions. °¨ Antibiotic medicine, if an infection is present. °¨ Medicines to dissolve blood clots. °¨ Medicines to treat coronary artery disease. °· Supportive care for conditions that do not require medicines. This may include: °¨ Resting. °¨ Applying heat   or cold packs to injured areas. °¨ Limiting activities until pain decreases. °HOME CARE INSTRUCTIONS °· If you were prescribed an antibiotic medicine, finish it all even if you start to feel better. °· Avoid any activities that bring on chest pain. °· Do not use any tobacco products, including  cigarettes, chewing tobacco, or electronic cigarettes. If you need help quitting, ask your health care provider. °· Do not drink alcohol. °· Take medicines only as directed by your health care provider. °· Keep all follow-up visits as directed by your health care provider. This is important. This includes any further testing if your chest pain does not go away. °· If heartburn is the cause for your chest pain, you may be told to keep your head raised (elevated) while sleeping. This reduces the chance that acid will go from your stomach into your esophagus. °· Make lifestyle changes as directed by your health care provider. These may include: °¨ Getting regular exercise. Ask your health care provider to suggest some activities that are safe for you. °¨ Eating a heart-healthy diet. A registered dietitian can help you to learn healthy eating options. °¨ Maintaining a healthy weight. °¨ Managing diabetes, if necessary. °¨ Reducing stress. °SEEK MEDICAL CARE IF: °· Your chest pain does not go away after treatment. °· You have a rash with blisters on your chest. °· You have a fever. °SEEK IMMEDIATE MEDICAL CARE IF:  °· Your chest pain is worse. °· You have an increasing cough, or you cough up blood. °· You have severe abdominal pain. °· You have severe weakness. °· You faint. °· You have chills. °· You have sudden, unexplained chest discomfort. °· You have sudden, unexplained discomfort in your arms, back, neck, or jaw. °· You have shortness of breath at any time. °· You suddenly start to sweat, or your skin gets clammy. °· You feel nauseous or you vomit. °· You suddenly feel light-headed or dizzy. °· Your heart begins to beat quickly, or it feels like it is skipping beats. °These symptoms may represent a serious problem that is an emergency. Do not wait to see if the symptoms will go away. Get medical help right away. Call your local emergency services (911 in the [place] Do not drive yourself to the hospital. °  °This  information is not intended to replace advice given to you by your health care provider. Make sure you discuss any questions you have with your health care provider. °  °Document Released: 06/15/2005 Document Revised: 09/26/2014 Document Reviewed: 04/11/2014 °Elsevier Interactive Patient Education ©2016 [employer] ° °

## 2015-10-10 NOTE — ED Provider Notes (Signed)
CSN: [id number]     Arrival date & time 10/09/15  2255 History  By signing my name below, I, Tammie Martinez, attest that this documentation has been prepared under the direction and in the presence of Azalia Bilis, MD. Electronically Signed: Bethel Martinez, ED Scribe. 10/10/2015. 1:15 AM   Chief Complaint  Patient presents with  . Chest Pain  . Abdominal Pain   The history is provided by the patient. No language interpreter was used.   Tammie Martinez is a 25 y.o. female who presents to the Emergency Department complaining of intermittent, shooting, left chest pain with onset 2 weeks ago. The pain is elicited with deep breathing. Pt notes that the pain starts on the left side and radiates to the right. The episodes last for approximately 1 minute and the pain is worse at night. She recently got over a dry cough that lasted for 1 month. Associated symptoms include left breast pain for 2 weeks. She notes that initially she felt a "lump" at the left breast. The lump has resolved but the pain has persisted. She is concerned because she had similar symptoms last year with mastitis.  Pt denies SOB, nausea, vomiting, and LE swelling.   Also complains of new malodorous vaginal discharge since last night. Pt states that she gets near monthly bacterial overgrowth infections. She notes that in the past she has been told that these infections were caused by perfumed soaps and she has been using a new soap lately. Pt denies new sexual partners. Her last pelvic exam was in December 2016.   History reviewed. No pertinent past medical history. History reviewed. No pertinent past surgical history. Family History  Problem Relation Age of Onset  . Heart disease Father   . Hypertension Maternal Grandfather   . COPD Maternal Grandfather    Social History  Substance Use Topics  . Smoking status: Current Every Day Smoker  . Smokeless tobacco: Never Used  . Alcohol Use: 4.2 oz/week    7 Glasses of wine per week    OB History    Gravida Para Term Preterm AB TAB SAB Ectopic Multiple Living   Review of Systems 10 Systems reviewed and all are negative for acute change except as noted in the HPI.  Allergies  Penicillins and Sulfa antibiotics  Home Medications   Prior to Admission medications   Medication Sig Start Date End Date Taking? Authorizing Provider  fluconazole (DIFLUCAN) 100 MG tablet Take 1 tablet (100 mg total) by mouth once. Repeat dose in 48-72 hour. 08/20/15   [name], CNM  metroNIDAZOLE (FLAGYL) 500 MG tablet Take 1 tablet (500 mg total) by mouth 2 (two) times daily. 08/20/15   [name], CNM  terconazole (TERAZOL 7) 0.4 % vaginal cream Place 1 applicator vaginally at bedtime. 08/20/15   [name], CNM   BP 115/69 mmHg  Pulse 64  Temp(Src) 99.1 F (37.3 C) (Oral)  Resp 16  SpO2 98%  LMP 10/02/2015 (Approximate) Physical Exam  Constitutional: She is oriented to person, place, and time. She appears well-developed and well-nourished. No distress.  HENT:  Head: Normocephalic and atraumatic.  Eyes: EOM are normal.  Neck: Normal range of motion.  Cardiovascular: Normal rate, regular rhythm and normal heart sounds.   Pulmonary/Chest: Effort normal and breath sounds normal. Left breast exhibits no mass and no skin change.  No overlying skin changes Normal appearance of left breast.  No masses palpated Chaperone present.   Abdominal: Soft. She exhibits no distension. There is no tenderness.  Musculoskeletal: Normal range of motion.  Neurological: She is alert and oriented to person, place, and time.  Skin: Skin is warm and dry.  Psychiatric: She has a normal mood and affect. Judgment normal.  Nursing note and vitals reviewed.   ED Course  Procedures (including critical care time) DIAGNOSTIC STUDIES: Oxygen Saturation is 98% on RA,  normal by my interpretation.    COORDINATION OF CARE: 1:13 AM Discussed treatment plan which  includes lab work, CXR, EKG with pt at bedside and pt agreed to plan.  Labs Review Labs Reviewed  URINALYSIS, ROUTINE W REFLEX MICROSCOPIC (NOT AT [facility]) - Abnormal; Notable for the following:    APPearance CLOUDY (*)    Ketones, ur 15 (*)    Leukocytes, UA TRACE (*)    All other components within normal limits  URINE MICROSCOPIC-ADD ON - Abnormal; Notable for the following:    Squamous Epithelial / LPF 6-30 (*)    Bacteria, UA RARE (*)    Crystals CA OXALATE CRYSTALS (*)    All other components within normal limits  LIPASE, BLOOD  COMPREHENSIVE METABOLIC PANEL  CBC  I-STAT TROPOININ, ED  I-STAT BETA HCG BLOOD, ED ([facility], [facility], [facility] ONLY)    Imaging Review Dg Chest 2 View  10/10/2015  CLINICAL DATA:  25 year old female with chest pain EXAM: CHEST  2 VIEW COMPARISON:  Radiograph dated 10/16/2012 FINDINGS: The heart size and mediastinal contours are within normal limits. Both lungs are clear. The visualized skeletal structures are unremarkable. Small stable left hilar granuloma. IMPRESSION: No active cardiopulmonary disease. Electronically Signed   By: Elgie Collard M.D.   On: 10/10/2015 00:34   I have personally reviewed and evaluated these images and lab results as part of my medical decision-making.   EKG Interpretation   Date/Time:  Friday October 09 2015 22:58:27 EST Ventricular Rate:  64 PR Interval:  144 QRS Duration: 86 QT Interval:  404 QTC Calculation: 416 R Axis:   78 Text Interpretation:  Normal sinus rhythm Normal ECG No significant change  was found Confirmed by Kamie Korber  MD, Kieu Quiggle ([id number]) on 10/10/2015 12:42:12 AM      MDM   Final diagnoses:  None    Patient is overall well-appearing.  Doubt cardiac etiology of her chest pain.  Doubt PE.  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  She understands to not have intercourse until she is seen and evaluated  completely by her gynecologist.  In regards to her left breast there is currently no abnormality noted and no masses.  Vascular she develop recurrence of her left breast pain or swelling that she speak with her OB/GYN about this.  I personally performed the services described in this documentation, which was scribed in my presence. The recorded information has been reviewed and is accurate.       Azalia Bilis, MD 10/10/15 [phone]

## 2015-10-13 ENCOUNTER — Ambulatory Visit: Payer: Medicaid Other | Admitting: Obstetrics

## 2015-10-13 ENCOUNTER — Other Ambulatory Visit: Payer: Self-pay | Admitting: Certified Nurse Midwife

## 2015-12-01 ENCOUNTER — Emergency Department (INDEPENDENT_AMBULATORY_CARE_PROVIDER_SITE_OTHER): Payer: Medicaid Other

## 2015-12-01 ENCOUNTER — Emergency Department (INDEPENDENT_AMBULATORY_CARE_PROVIDER_SITE_OTHER)
Admission: EM | Admit: 2015-12-01 | Discharge: 2015-12-01 | Disposition: A | Discharge status: Home / Self Care | Payer: Medicaid Other | Source: Home / Self Care | Attending: Family Medicine | Admitting: Family Medicine

## 2015-12-01 ENCOUNTER — Encounter (HOSPITAL_COMMUNITY): Payer: Self-pay | Admitting: *Deleted

## 2015-12-01 DIAGNOSIS — J111 Influenza due to unidentified influenza virus with other respiratory manifestations: Secondary | ICD-10-CM

## 2015-12-01 MED ORDER — AZITHROMYCIN 250 MG PO TABS: ORAL_TABLET | ORAL | Status: DC

## 2015-12-01 MED ORDER — GUAIFENESIN-CODEINE 100-10 MG/5ML PO SYRP: 10.0000 mL | ORAL_SOLUTION | Freq: Four times a day (QID) | ORAL | Status: DC | PRN

## 2015-12-01 MED ORDER — IPRATROPIUM BROMIDE 0.06 % NA SOLN: 2.0000 | Freq: Four times a day (QID) | NASAL | Status: DC

## 2015-12-01 NOTE — ED Notes (Signed)
Pt  Reports   Symptoms  Of  Body  Aches  /  Cough     With  Onset  Of  Symptoms        X  3  Days      she  Reports  Coughing up blood  As  Well     Family members  Have  Similar  Symptoms

## 2015-12-01 NOTE — ED Provider Notes (Signed)
CSN: [id number]     Arrival date & time 12/01/15  1752 History   First MD Initiated Contact with Patient 12/01/15 1934     Chief Complaint  Patient presents with  . Cough   (Consider location/radiation/quality/duration/timing/severity/associated sxs/prior Treatment) Patient is a 25 y.o. female presenting with cough. The history is provided by the patient.  Cough Cough characteristics:  Non-productive and dry Severity:  Moderate Onset quality:  Gradual Duration:  4 days Progression:  Waxing and waning Chronicity:  New Smoker: yes   Context: sick contacts   Relieved by:  None tried Worsened by:  Nothing tried Ineffective treatments:  None tried Associated symptoms: rhinorrhea   Associated symptoms: no chills, no fever, no shortness of breath, no sinus congestion and no sore throat     History reviewed. No pertinent past medical history. History reviewed. No pertinent past surgical history. Family History  Problem Relation Age of Onset  . Heart disease Father   . Hypertension Maternal Grandfather   . COPD Maternal Grandfather    Social History  Substance Use Topics  . Smoking status: Current Every Day Smoker  . Smokeless tobacco: Never Used  . Alcohol Use: 4.2 oz/week    7 Glasses of wine per week   OB History    Gravida Para Term Preterm AB TAB SAB Ectopic Multiple Living   2 1 1  1  1   1      Review of Systems  Constitutional: Negative.  Negative for fever and chills.  HENT: Positive for congestion, postnasal drip and rhinorrhea. Negative for sore throat.   Respiratory: Positive for cough. Negative for shortness of breath.   Cardiovascular: Negative.   Genitourinary: Negative.   All other systems reviewed and are negative.   Allergies  Penicillins and Sulfa antibiotics  Home Medications   Prior to Admission medications   Medication Sig Start Date End Date Taking? Authorizing Provider  azithromycin (ZITHROMAX Z-PAK) 250 MG tablet Take as directed on pack  12/01/15   Linna HoffJames D Gilman Olazabal, MD  guaiFENesin-codeine [facility](ROBITUSSIN AC) 100-10 MG/5ML syrup Take 10 mLs by mouth 4 (four) times daily as needed for cough. 12/01/15   Linna HoffJames D Jeffie Widdowson, MD  ibuprofen (ADVIL,MOTRIN) 600 MG tablet Take 1 tablet (600 mg total) by mouth every 8 (eight) hours as needed. 10/10/15   Azalia BilisKevin Campos, MD  ipratropium (ATROVENT) 0.06 % nasal spray Place 2 sprays into both nostrils 4 (four) times daily. 12/01/15   Linna HoffJames D Vantasia Pinkney, MD  metroNIDAZOLE (FLAGYL) 500 MG tablet Take 1 tablet (500 mg total) by mouth 2 (two) times daily. 10/10/15   Azalia BilisKevin Campos, MD   Meds Ordered and Administered this Visit  Medications - No data to display  BP 113/73 mmHg  Pulse 81  Temp(Src) 99.1 F (37.3 C) (Oral)  Resp 14  SpO2 99%  LMP 11/29/2015 No data found.   Physical Exam  Constitutional: She is oriented to person, place, and time. She appears well-developed and well-nourished. No distress.  HENT:  Right Ear: External ear normal.  Left Ear: External ear normal.  Mouth/Throat: Oropharynx is clear and moist.  Neck: Normal range of motion. Neck supple.  Cardiovascular: Regular rhythm and normal heart sounds.   Pulmonary/Chest: Effort normal and breath sounds normal.  Lymphadenopathy:    She has no cervical adenopathy.  Neurological: She is alert and oriented to person, place, and time.  Skin: Skin is warm and dry.  Nursing note and vitals reviewed.   ED Course  Procedures (including critical care  time)  Labs Review Labs Reviewed - No data to display  Imaging Review Dg Chest 2 View  12/01/2015  CLINICAL DATA:  Fever, hemoptysis EXAM: CHEST  2 VIEW COMPARISON:  10/09/2015 FINDINGS: The heart size and mediastinal contours are within normal limits. Both lungs are clear. The visualized skeletal structures are unremarkable. Right nipple shadow evident. IMPRESSION: No active cardiopulmonary disease. Electronically Signed   By: Judie Petit.  Shick M.D.   On: 12/01/2015 20:05   X-rays reviewed and report  per radiologist.   Visual Acuity Review  Right Eye Distance:   Left Eye Distance:   Bilateral Distance:    Right Eye Near:   Left Eye Near:    Bilateral Near:         MDM   1. Bronchitis with influenza    Meds ordered this encounter  Medications  . azithromycin (ZITHROMAX Z-PAK) 250 MG tablet    Sig: Take as directed on pack    Dispense:  6 tablet    Refill:  0  . ipratropium (ATROVENT) 0.06 % nasal spray    Sig: Place 2 sprays into both nostrils 4 (four) times daily.    Dispense:  15 mL    Refill:  1  . guaiFENesin-codeine (ROBITUSSIN AC) 100-10 MG/5ML syrup    Sig: Take 10 mLs by mouth 4 (four) times daily as needed for cough.    Dispense:  180 mL    Refill:  0      Linna Hoff, MD 12/01/15 2057

## 2016-01-20 ENCOUNTER — Other Ambulatory Visit: Payer: Self-pay | Admitting: *Deleted

## 2016-01-20 ENCOUNTER — Telehealth: Payer: Self-pay | Admitting: *Deleted

## 2016-01-20 DIAGNOSIS — B9689 Other specified bacterial agents as the cause of diseases classified elsewhere: Secondary | ICD-10-CM

## 2016-01-20 DIAGNOSIS — N760 Acute vaginitis: Principal | ICD-10-CM

## 2016-01-20 DIAGNOSIS — B379 Candidiasis, unspecified: Secondary | ICD-10-CM

## 2016-01-20 DIAGNOSIS — T3695XA Adverse effect of unspecified systemic antibiotic, initial encounter: Secondary | ICD-10-CM

## 2016-01-20 MED ORDER — FLUCONAZOLE 150 MG PO TABS: 150.0000 mg | ORAL_TABLET | Freq: Once | ORAL | Status: DC

## 2016-01-20 MED ORDER — METRONIDAZOLE 500 MG PO TABS: 500.0000 mg | ORAL_TABLET | Freq: Two times a day (BID) | ORAL | Status: DC

## 2016-01-20 NOTE — Telephone Encounter (Signed)
Patient is requesting a refill on medication. 10:15 Call to patient- patient is requesting treatment for BV- she states she is having symptoms. Reviewed treatment and she is aware to call if her symptoms do not improve. Patient also request yeast medication for after. rx sent to pharmacy per protocol.

## 2016-02-09 DIAGNOSIS — T169XXA Foreign body in ear, unspecified ear, initial encounter: Secondary | ICD-10-CM | POA: Insufficient documentation

## 2016-04-19 ENCOUNTER — Ambulatory Visit: Payer: Self-pay | Admitting: Obstetrics

## 2016-04-26 ENCOUNTER — Telehealth: Payer: Self-pay | Admitting: *Deleted

## 2016-04-26 NOTE — Telephone Encounter (Signed)
Patient is requesting medication to treat yeast infection. She is over due annual exam. Do you want to treat and schedule or make her schedule before treating?

## 2016-05-02 ENCOUNTER — Ambulatory Visit (INDEPENDENT_AMBULATORY_CARE_PROVIDER_SITE_OTHER): Payer: Medicaid Other | Admitting: Obstetrics

## 2016-05-02 ENCOUNTER — Encounter: Payer: Self-pay | Admitting: Obstetrics

## 2016-05-02 VITALS — BP 126/82 | HR 69 | Wt 146.0 lb

## 2016-05-02 DIAGNOSIS — B373 Candidiasis of vulva and vagina: Secondary | ICD-10-CM

## 2016-05-02 DIAGNOSIS — A499 Bacterial infection, unspecified: Secondary | ICD-10-CM

## 2016-05-02 DIAGNOSIS — Z01419 Encounter for gynecological examination (general) (routine) without abnormal findings: Secondary | ICD-10-CM

## 2016-05-02 DIAGNOSIS — N939 Abnormal uterine and vaginal bleeding, unspecified: Secondary | ICD-10-CM

## 2016-05-02 DIAGNOSIS — N760 Acute vaginitis: Secondary | ICD-10-CM

## 2016-05-02 DIAGNOSIS — Z3202 Encounter for pregnancy test, result negative: Secondary | ICD-10-CM | POA: Diagnosis not present

## 2016-05-02 DIAGNOSIS — Z0000 Encounter for general adult medical examination without abnormal findings: Secondary | ICD-10-CM

## 2016-05-02 DIAGNOSIS — Z3169 Encounter for other general counseling and advice on procreation: Secondary | ICD-10-CM

## 2016-05-02 DIAGNOSIS — Z124 Encounter for screening for malignant neoplasm of cervix: Secondary | ICD-10-CM

## 2016-05-02 DIAGNOSIS — B9689 Other specified bacterial agents as the cause of diseases classified elsewhere: Secondary | ICD-10-CM

## 2016-05-02 DIAGNOSIS — B3731 Acute candidiasis of vulva and vagina: Secondary | ICD-10-CM

## 2016-05-02 LAB — POCT URINE PREGNANCY: PREG TEST UR: NEGATIVE

## 2016-05-02 LAB — POCT URINALYSIS DIPSTICK
BILIRUBIN UA: NEGATIVE
GLUCOSE UA: NEGATIVE
Ketones, UA: NEGATIVE
Leukocytes, UA: NEGATIVE
Nitrite, UA: NEGATIVE
PH UA: 6
RBC UA: NEGATIVE
SPEC GRAV UA: 1.015
UROBILINOGEN UA: 1

## 2016-05-02 MED ORDER — VITAFOL-NANO 18-0.6-0.4 MG PO TABS: 1.0000 | ORAL_TABLET | Freq: Every day | ORAL | 11 refills | Status: DC

## 2016-05-02 MED ORDER — METRONIDAZOLE 500 MG PO TABS: 500.0000 mg | ORAL_TABLET | Freq: Two times a day (BID) | ORAL | 2 refills | Status: DC

## 2016-05-02 MED ORDER — IBUPROFEN 800 MG PO TABS: 800.0000 mg | ORAL_TABLET | Freq: Three times a day (TID) | ORAL | 5 refills | Status: DC | PRN

## 2016-05-02 MED ORDER — FLUCONAZOLE 150 MG PO TABS: 150.0000 mg | ORAL_TABLET | Freq: Once | ORAL | 2 refills | Status: AC

## 2016-05-02 NOTE — Progress Notes (Signed)
Subjective:        Tammie Martinez is a 25 y.o. female here for a routine exam.  Current complaints: Irregular vaginal bleeding.  Malodorous vaginal discharge with itching.  Considering having another baby soon.  Personal health questionnaire:  Is patient [name], have a family history of breast and/or ovarian cancer: no Is there a family history of uterine cancer diagnosed at age < 9250, gastrointestinal cancer, urinary tract cancer, family member who is a Personnel officerLynch syndrome-associated carrier: no Is the patient overweight and hypertensive, family history of diabetes, personal history of gestational diabetes, preeclampsia or PCOS: no Is patient over 7155, have PCOS,  family history of premature CHD under age 26, diabetes, smoke, have hypertension or peripheral artery disease:  no At any time, has a partner hit, kicked or otherwise hurt or frightened you?: no Over the past 2 weeks, have you felt down, depressed or hopeless?: no Over the past 2 weeks, have you felt little interest or pleasure in doing things?:no   Gynecologic History No LMP recorded. Contraception: none Last Pap: 2014. Results were: normal Last mammogram: n/a. Results were: n/a  Obstetric History OB History  Gravida Para Term Preterm AB Living  2 1 1   1 1   SAB TAB Ectopic Multiple Live Births  1       1    # Outcome Date GA Lbr Len/2nd Weight Sex Delivery Anes PTL Lv  2 Term  [id number]       LIV  1 SAB         DEC      No past medical history on file.  No past surgical history on file.   Current Outpatient Prescriptions:  .  ibuprofen (ADVIL,MOTRIN) 600 MG tablet, Take 1 tablet (600 mg total) by mouth every 8 (eight) hours as needed., Disp: 15 tablet, Rfl: 0 .  fluconazole (DIFLUCAN) 150 MG tablet, Take 1 tablet (150 mg total) by mouth once., Disp: 1 tablet, Rfl: 2 .  ibuprofen (ADVIL,MOTRIN) 800 MG tablet, Take 1 tablet (800 mg total) by mouth every 8 (eight) hours as needed., Disp: 30 tablet, Rfl: 5 .   metroNIDAZOLE (FLAGYL) 500 MG tablet, Take 1 tablet (500 mg total) by mouth 2 (two) times daily., Disp: 14 tablet, Rfl: 2 .  Prenatal-Fe Fum-Methf-FA w/o A (VITAFOL-NANO) 18-0.6-0.4 MG TABS, Take 1 tablet by mouth daily before breakfast., Disp: 30 tablet, Rfl: 11 Allergies  Allergen Reactions  . Penicillins Hives  . Sulfa Antibiotics Hives    Social History  Substance Use Topics  . Smoking status: Current Every Day Smoker  . Smokeless tobacco: Never Used  . Alcohol use 4.2 oz/week    7 Glasses of wine per week    Family History  Problem Relation Age of Onset  . Heart disease Father   . Hypertension Maternal Grandfather   . COPD Maternal Grandfather       Review of Systems  Constitutional: negative for fatigue and weight loss Respiratory: negative for cough and wheezing Cardiovascular: negative for chest pain, fatigue and palpitations Gastrointestinal: negative for abdominal pain and change in bowel habits Musculoskeletal:negative for myalgias Neurological: negative for gait problems and tremors Behavioral/Psych: negative for abusive relationship, depression Endocrine: negative for temperature intolerance   Genitourinary:positive for malodorous vaginal discharge Integument/breast: negative for breast lump, breast tenderness, nipple discharge and skin lesion(s)    Objective:       BP 126/82   Pulse 69   Wt 146 lb (66.2 kg)   BMI 24.67  kg/m  General:   alert  Skin:   no rash or abnormalities  Lungs:   clear to auscultation bilaterally  Heart:   regular rate and rhythm, S1, S2 normal, no murmur, click, rub or gallop  Breasts:   normal without suspicious masses, skin or nipple changes or axillary nodes  Abdomen:  normal findings: no organomegaly, soft, non-tender and no hernia  Pelvis:  External genitalia: normal general appearance Urinary system: urethral meatus normal and bladder without fullness, nontender Vaginal: normal without tenderness, induration or  masses Cervix: normal appearance Adnexa: normal bimanual exam Uterus: anteverted and non-tender, normal size   Lab Review Urine pregnancy test Labs reviewed yes Radiologic studies reviewed no    Assessment:    Healthy female exam.    BV  Preconception counseling   Plan:    Flagyl / Diflucan Rx  Education reviewed: calcium supplements, self breast exams and weight bearing exercise. Contraception: none. Follow up in: 6 months.   Meds ordered this encounter  Medications  . ibuprofen (ADVIL,MOTRIN) 800 MG tablet    Sig: Take 1 tablet (800 mg total) by mouth every 8 (eight) hours as needed.    Dispense:  30 tablet    Refill:  5  . Prenatal-Fe Fum-Methf-FA w/o A (VITAFOL-NANO) 18-0.6-0.4 MG TABS    Sig: Take 1 tablet by mouth daily before breakfast.    Dispense:  30 tablet    Refill:  11  . metroNIDAZOLE (FLAGYL) 500 MG tablet    Sig: Take 1 tablet (500 mg total) by mouth 2 (two) times daily.    Dispense:  14 tablet    Refill:  2  . fluconazole (DIFLUCAN) 150 MG tablet    Sig: Take 1 tablet (150 mg total) by mouth once.    Dispense:  1 tablet    Refill:  2   Orders Placed This Encounter  Procedures  . POCT urinalysis dipstick  . POCT urine pregnancy     Patient ID: Tammie Martinez, female   DOB: 10-21-1990, 25 y.o.   MRN: [id number]

## 2016-05-05 LAB — PAP IG W/ RFLX HPV ASCU: PAP SMEAR COMMENT: 0

## 2016-05-06 ENCOUNTER — Other Ambulatory Visit: Payer: Self-pay | Admitting: Obstetrics

## 2016-05-06 DIAGNOSIS — B3731 Acute candidiasis of vulva and vagina: Secondary | ICD-10-CM

## 2016-05-06 DIAGNOSIS — B373 Candidiasis of vulva and vagina: Secondary | ICD-10-CM

## 2016-05-06 LAB — NUSWAB VG+, CANDIDA 6SP
Atopobium vaginae: HIGH Score — AB
CANDIDA ALBICANS, NAA: POSITIVE — AB
CANDIDA GLABRATA, NAA: NEGATIVE
CANDIDA TROPICALIS, NAA: NEGATIVE
Candida krusei, NAA: NEGATIVE
Candida lusitaniae, NAA: NEGATIVE
Candida parapsilosis, NAA: NEGATIVE
Chlamydia trachomatis, NAA: NEGATIVE
NEISSERIA GONORRHOEAE, NAA: NEGATIVE
Trich vag by NAA: NEGATIVE

## 2016-05-06 MED ORDER — FLUCONAZOLE 150 MG PO TABS: 150.0000 mg | ORAL_TABLET | Freq: Once | ORAL | 2 refills | Status: AC

## 2016-05-12 ENCOUNTER — Telehealth: Payer: Self-pay | Admitting: *Deleted

## 2016-05-12 NOTE — Telephone Encounter (Signed)
Patient is aware of lab result and is being transferred to front to schedule Colposcopy consult with Dr Clearance CootsHarper.

## 2016-05-30 ENCOUNTER — Institutional Professional Consult (permissible substitution): Payer: Medicaid Other | Admitting: Obstetrics

## 2016-06-16 ENCOUNTER — Institutional Professional Consult (permissible substitution): Payer: Self-pay | Admitting: Obstetrics

## 2016-06-23 ENCOUNTER — Telehealth: Payer: Self-pay

## 2016-06-23 NOTE — Telephone Encounter (Signed)
Returned call to patient to inform of refill status on medication.

## 2016-07-06 ENCOUNTER — Ambulatory Visit (INDEPENDENT_AMBULATORY_CARE_PROVIDER_SITE_OTHER): Payer: Medicaid Other | Admitting: Obstetrics

## 2016-07-06 VITALS — BP 97/63 | HR 70 | Temp 98.9°F | Wt 149.5 lb

## 2016-07-06 DIAGNOSIS — R87612 Low grade squamous intraepithelial lesion on cytologic smear of cervix (LGSIL): Secondary | ICD-10-CM

## 2016-07-07 ENCOUNTER — Encounter: Payer: Self-pay | Admitting: Obstetrics

## 2016-07-07 NOTE — Progress Notes (Signed)
Patient ID: Tammie Martinez, female   DOB: 1990-10-10, 25 y.o.   MRN: [id number]  Chief Complaint  Patient presents with  . other    consult colposcopy    HPI Tammie Martinez is a 25 y.o. female.  LGSIL pap smear.  Presents for consultation and management recommendations. HPI  History reviewed. No pertinent past medical history.  History reviewed. No pertinent surgical history.  Family History  Problem Relation Age of Onset  . Heart disease Father   . Hypertension Maternal Grandfather   . COPD Maternal Grandfather     Social History Social History  Substance Use Topics  . Smoking status: Current Every Day Smoker  . Smokeless tobacco: Never Used  . Alcohol use 4.2 oz/week    7 Glasses of wine per week    Allergies  Allergen Reactions  . Penicillins Hives  . Sulfa Antibiotics Hives    Current Outpatient Prescriptions  Medication Sig Dispense Refill  . ibuprofen (ADVIL,MOTRIN) 600 MG tablet Take 1 tablet (600 mg total) by mouth every 8 (eight) hours as needed. (Patient not taking: Reported on 07/06/2016) 15 tablet 0  . ibuprofen (ADVIL,MOTRIN) 800 MG tablet Take 1 tablet (800 mg total) by mouth every 8 (eight) hours as needed. (Patient not taking: Reported on 07/06/2016) 30 tablet 5  . metroNIDAZOLE (FLAGYL) 500 MG tablet Take 1 tablet (500 mg total) by mouth 2 (two) times daily. (Patient not taking: Reported on 07/06/2016) 14 tablet 2  . Prenatal-Fe Fum-Methf-FA w/o A (VITAFOL-NANO) 18-0.6-0.4 MG TABS Take 1 tablet by mouth daily before breakfast. (Patient not taking: Reported on 07/06/2016) 30 tablet 11   No current facility-administered medications for this visit.     Review of Systems Review of Systems Constitutional: negative for fatigue and weight loss Respiratory: negative for cough and wheezing Cardiovascular: negative for chest pain, fatigue and palpitations Gastrointestinal: negative for abdominal pain and change in bowel  habits Genitourinary:negative Integument/breast: negative for nipple discharge Musculoskeletal:negative for myalgias Neurological: negative for gait problems and tremors Behavioral/Psych: negative for abusive relationship, depression Endocrine: negative for temperature intolerance     Blood pressure 97/63, pulse 70, temperature 98.9 F (37.2 C), temperature source Oral, weight 149 lb 8 oz (67.8 kg).  Physical Exam Physical Exam:  Deferred  50% of 10 min visit spent on counseling and coordination of care.   Data Reviewed Cytology  Assessment     LGSIL    Plan    Colposcopy scheduled.  No orders of the defined types were placed in this encounter.  No orders of the defined types were placed in this encounter.

## 2016-07-22 ENCOUNTER — Encounter: Payer: Self-pay | Admitting: Obstetrics

## 2016-07-22 ENCOUNTER — Other Ambulatory Visit: Payer: Self-pay | Admitting: Obstetrics

## 2016-07-22 ENCOUNTER — Ambulatory Visit (INDEPENDENT_AMBULATORY_CARE_PROVIDER_SITE_OTHER): Payer: Medicaid Other | Admitting: Obstetrics

## 2016-07-22 VITALS — BP 112/69 | HR 59 | Wt 150.0 lb

## 2016-07-22 DIAGNOSIS — R87612 Low grade squamous intraepithelial lesion on cytologic smear of cervix (LGSIL): Secondary | ICD-10-CM | POA: Diagnosis not present

## 2016-07-22 DIAGNOSIS — Z3202 Encounter for pregnancy test, result negative: Secondary | ICD-10-CM

## 2016-07-22 DIAGNOSIS — Z01818 Encounter for other preprocedural examination: Secondary | ICD-10-CM

## 2016-07-22 LAB — POCT URINE PREGNANCY: Preg Test, Ur: NEGATIVE

## 2016-07-23 ENCOUNTER — Encounter: Payer: Self-pay | Admitting: Obstetrics

## 2016-07-23 NOTE — Progress Notes (Signed)
Colposcopy Procedure Note  Indications: Pap smear 3 months ago showed: LGSIL. The prior pap showed no abnormalities.  Prior cervical/vaginal disease: normal exam without visible pathology. Prior cervical treatment: no treatment.  Procedure Details  The risks and benefits of the procedure and Written informed consent obtained.  A time-out was performed confirming the patient, procedure and allergy status  Speculum placed in vagina and excellent visualization of cervix achieved, cervix swabbed x 3 with acetic acid solution.  Findings: Cervix: no visible lesions; SCJ visualized 360 degrees without lesions, endocervical curettage performed, cervical biopsies taken at 6 and 12 o'clock, specimen labelled and sent to pathology and hemostasis achieved with silver nitrate.   Vaginal inspection: normal without visible lesions. Vulvar colposcopy: vulvar colposcopy not performed.   Physical Exam   Specimens: ECC and Cervical Biopsies  Complications: none.  Plan: Specimens labelled and sent to Pathology. Will base further treatment on Pathology findings. Post biopsy instructions given to patient. Return to discuss Pathology results in 2 weeks.

## 2016-07-27 ENCOUNTER — Encounter: Payer: Self-pay | Admitting: *Deleted

## 2016-08-09 ENCOUNTER — Ambulatory Visit: Payer: Medicaid Other | Admitting: Obstetrics

## 2016-08-24 ENCOUNTER — Encounter: Payer: Self-pay | Admitting: Obstetrics

## 2016-08-24 ENCOUNTER — Ambulatory Visit (INDEPENDENT_AMBULATORY_CARE_PROVIDER_SITE_OTHER): Payer: Medicaid Other | Admitting: Obstetrics

## 2016-08-24 VITALS — BP 105/70 | HR 65 | Temp 98.1°F

## 2016-08-24 DIAGNOSIS — R87612 Low grade squamous intraepithelial lesion on cytologic smear of cervix (LGSIL): Secondary | ICD-10-CM | POA: Diagnosis not present

## 2016-08-24 DIAGNOSIS — N946 Dysmenorrhea, unspecified: Secondary | ICD-10-CM

## 2016-08-24 DIAGNOSIS — N870 Mild cervical dysplasia: Secondary | ICD-10-CM

## 2016-08-24 DIAGNOSIS — Z0000 Encounter for general adult medical examination without abnormal findings: Secondary | ICD-10-CM

## 2016-08-24 MED ORDER — VITAFOL-NANO 18-0.6-0.4 MG PO TABS: 1.0000 | ORAL_TABLET | Freq: Every day | ORAL | 11 refills | Status: DC

## 2016-08-24 MED ORDER — OXYCODONE-ACETAMINOPHEN 10-325 MG PO TABS: 1.0000 | ORAL_TABLET | Freq: Four times a day (QID) | ORAL | 0 refills | Status: DC | PRN

## 2016-08-25 ENCOUNTER — Encounter: Payer: Self-pay | Admitting: Obstetrics

## 2016-08-25 NOTE — Progress Notes (Signed)
S:  History of LGSIL pap smear O:  Colposcopic biopsies with LGSIL and negative ECC A:  LGSIL P:  Repteat pap smear in 1 year.  Tammie Martinez A. Clearance CootsHarper MD 08-25-16

## 2016-09-04 ENCOUNTER — Other Ambulatory Visit: Payer: Self-pay | Admitting: Obstetrics

## 2016-09-04 DIAGNOSIS — B9689 Other specified bacterial agents as the cause of diseases classified elsewhere: Secondary | ICD-10-CM

## 2016-09-04 DIAGNOSIS — N760 Acute vaginitis: Principal | ICD-10-CM

## 2016-09-21 ENCOUNTER — Telehealth: Payer: Self-pay | Admitting: *Deleted

## 2016-09-21 NOTE — Telephone Encounter (Signed)
Pt called to office for Rx refill.  Attempt to return call.  No answer, LM on VM to call office.

## 2016-09-22 ENCOUNTER — Other Ambulatory Visit: Payer: Self-pay | Admitting: *Deleted

## 2016-09-22 DIAGNOSIS — N760 Acute vaginitis: Principal | ICD-10-CM

## 2016-09-22 DIAGNOSIS — B9689 Other specified bacterial agents as the cause of diseases classified elsewhere: Secondary | ICD-10-CM

## 2016-09-22 MED ORDER — METRONIDAZOLE 500 MG PO TABS: 500.0000 mg | ORAL_TABLET | Freq: Two times a day (BID) | ORAL | 2 refills | Status: DC

## 2016-09-22 MED ORDER — TERCONAZOLE 0.4 % VA CREA: 1.0000 | TOPICAL_CREAM | Freq: Every day | VAGINAL | 0 refills | Status: DC

## 2016-09-22 NOTE — Progress Notes (Signed)
Pt called to office stating she has bacterial infection.  Pt was treated in December and states she used yeast medication afterward and now feels BV is back. Reviewed with Dr Clearance CootsHarper, approved to refill Flagyl. Terazol Rx sent, pt states she gets yeast infection after antibiotic.  If no improvement after this treatment, pt needs appt. Pt aware and states understanding.

## 2016-11-03 ENCOUNTER — Ambulatory Visit: Payer: Self-pay | Admitting: Obstetrics

## 2016-12-15 ENCOUNTER — Telehealth: Payer: Self-pay | Admitting: *Deleted

## 2016-12-15 DIAGNOSIS — B373 Candidiasis of vulva and vagina: Secondary | ICD-10-CM

## 2016-12-15 DIAGNOSIS — B3731 Acute candidiasis of vulva and vagina: Secondary | ICD-10-CM

## 2016-12-15 MED ORDER — FLUCONAZOLE 150 MG PO TABS: 150.0000 mg | ORAL_TABLET | Freq: Once | ORAL | 0 refills | Status: AC

## 2016-12-15 NOTE — Telephone Encounter (Signed)
Patient is calling with symptoms of yeast- she is having discharge and would like treatment. She is requesting oral treatment.

## 2017-01-10 ENCOUNTER — Encounter (HOSPITAL_COMMUNITY): Payer: Self-pay | Admitting: Emergency Medicine

## 2017-01-10 ENCOUNTER — Emergency Department (HOSPITAL_COMMUNITY)
Admission: EM | Admit: 2017-01-10 | Discharge: 2017-01-10 | Disposition: A | Discharge status: Home / Self Care | Payer: Medicaid Other | Attending: Emergency Medicine | Admitting: Emergency Medicine

## 2017-01-10 DIAGNOSIS — Y939 Activity, unspecified: Secondary | ICD-10-CM | POA: Insufficient documentation

## 2017-01-10 DIAGNOSIS — Z79899 Other long term (current) drug therapy: Secondary | ICD-10-CM | POA: Diagnosis not present

## 2017-01-10 DIAGNOSIS — Y999 Unspecified external cause status: Secondary | ICD-10-CM | POA: Diagnosis not present

## 2017-01-10 DIAGNOSIS — Z87891 Personal history of nicotine dependence: Secondary | ICD-10-CM | POA: Insufficient documentation

## 2017-01-10 DIAGNOSIS — M545 Low back pain, unspecified: Secondary | ICD-10-CM

## 2017-01-10 DIAGNOSIS — Y92410 Unspecified street and highway as the place of occurrence of the external cause: Secondary | ICD-10-CM | POA: Diagnosis not present

## 2017-01-10 DIAGNOSIS — Z3201 Encounter for pregnancy test, result positive: Secondary | ICD-10-CM

## 2017-01-10 DIAGNOSIS — Z3A01 Less than 8 weeks gestation of pregnancy: Secondary | ICD-10-CM | POA: Insufficient documentation

## 2017-01-10 DIAGNOSIS — O9A211 Injury, poisoning and certain other consequences of external causes complicating pregnancy, first trimester: Secondary | ICD-10-CM | POA: Insufficient documentation

## 2017-01-10 LAB — I-STAT BETA HCG BLOOD, ED (MC, WL, AP ONLY)

## 2017-01-10 MED ORDER — OXYCODONE-ACETAMINOPHEN 5-325 MG PO TABS
1.0000 | ORAL_TABLET | Freq: Once | ORAL | Status: AC
  Administered 2017-01-10: 1 via ORAL
  Filled 2017-01-10: qty 1
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
## 2017-02-07 ENCOUNTER — Encounter: Payer: Medicaid Other | Admitting: Obstetrics

## 2017-02-15 ENCOUNTER — Telehealth: Payer: Self-pay

## 2017-02-15 NOTE — Telephone Encounter (Signed)
Pt called and states that she is about [redacted] weeks pregnant. Pt has not had pregnancy confirmed, and needs to be seen for spotting. Pt has not had any sexual intercourse. She reports that discharge is dark brown and has been present for about 3 days. Call forwarded to front staff to schedule problem visit with upt

## 2017-02-16 DIAGNOSIS — O034 Incomplete spontaneous abortion without complication: Secondary | ICD-10-CM | POA: Diagnosis not present

## 2017-02-16 DIAGNOSIS — Z3A12 12 weeks gestation of pregnancy: Secondary | ICD-10-CM | POA: Diagnosis not present

## 2017-02-17 ENCOUNTER — Inpatient Hospital Stay (HOSPITAL_COMMUNITY): Payer: Medicaid Other

## 2017-02-17 ENCOUNTER — Encounter (HOSPITAL_COMMUNITY): Payer: Self-pay | Admitting: *Deleted

## 2017-02-17 ENCOUNTER — Inpatient Hospital Stay (HOSPITAL_COMMUNITY)
Admission: AD | Admit: 2017-02-17 | Discharge: 2017-02-17 | Disposition: A | Discharge status: Home / Self Care | Payer: Medicaid Other | Source: Ambulatory Visit | Attending: Obstetrics and Gynecology | Admitting: Obstetrics and Gynecology

## 2017-02-17 DIAGNOSIS — O209 Hemorrhage in early pregnancy, unspecified: Secondary | ICD-10-CM | POA: Diagnosis present

## 2017-02-17 DIAGNOSIS — O3680X0 Pregnancy with inconclusive fetal viability, not applicable or unspecified: Secondary | ICD-10-CM | POA: Insufficient documentation

## 2017-02-17 DIAGNOSIS — Z3A01 Less than 8 weeks gestation of pregnancy: Secondary | ICD-10-CM | POA: Diagnosis not present

## 2017-02-17 DIAGNOSIS — O021 Missed abortion: Secondary | ICD-10-CM

## 2017-02-17 DIAGNOSIS — Z3A12 12 weeks gestation of pregnancy: Secondary | ICD-10-CM | POA: Diagnosis not present

## 2017-02-17 DIAGNOSIS — O039 Complete or unspecified spontaneous abortion without complication: Secondary | ICD-10-CM

## 2017-02-17 DIAGNOSIS — O034 Incomplete spontaneous abortion without complication: Secondary | ICD-10-CM | POA: Diagnosis not present

## 2017-02-17 LAB — WET PREP, GENITAL
CLUE CELLS WET PREP: NONE SEEN
Sperm: NONE SEEN
Trich, Wet Prep: NONE SEEN
YEAST WET PREP: NONE SEEN

## 2017-02-17 LAB — CBC WITH DIFFERENTIAL/PLATELET
BASOS ABS: 0 10*3/uL (ref 0.0–0.1)
Basophils Relative: 0 %
EOS ABS: 0.4 10*3/uL (ref 0.0–0.7)
EOS PCT: 4 %
HCT: 33.9 % — ABNORMAL LOW (ref 36.0–46.0)
HEMOGLOBIN: 11.2 g/dL — AB (ref 12.0–15.0)
LYMPHS ABS: 3.2 10*3/uL (ref 0.7–4.0)
LYMPHS PCT: 37 %
MCH: 28.2 pg (ref 26.0–34.0)
MCHC: 33 g/dL (ref 30.0–36.0)
MCV: 85.4 fL (ref 78.0–100.0)
Monocytes Absolute: 0.6 10*3/uL (ref 0.1–1.0)
Monocytes Relative: 7 %
Neutro Abs: 4.3 10*3/uL (ref 1.7–7.7)
Neutrophils Relative %: 52 %
PLATELETS: 277 10*3/uL (ref 150–400)
RBC: 3.97 MIL/uL (ref 3.87–5.11)
RDW: 12.7 % (ref 11.5–15.5)
WBC: 8.5 10*3/uL (ref 4.0–10.5)

## 2017-02-17 LAB — URINALYSIS, ROUTINE W REFLEX MICROSCOPIC
BILIRUBIN URINE: NEGATIVE
GLUCOSE, UA: NEGATIVE mg/dL
Ketones, ur: NEGATIVE mg/dL
Nitrite: NEGATIVE
PH: 5 (ref 5.0–8.0)
Protein, ur: NEGATIVE mg/dL
SPECIFIC GRAVITY, URINE: 1.019 (ref 1.005–1.030)

## 2017-02-17 NOTE — MAU Note (Signed)
Urine in lab 

## 2017-02-17 NOTE — MAU Provider Note (Signed)
History     CSN: [id number]  Arrival date and time: 02/17/17 1656   First Provider Initiated Contact with Patient 02/17/17 1829      Chief Complaint  Patient presents with  . Vaginal Discharge  . ? no fetal heart rate   HPI Tammie Martinez is 26 y.o. G3P1011 [redacted]w[redacted]d weeks presenting with report of brown discharge X 4 days.  Became pink yesterday and now back to brown.  Was seen at an Urgent Care in [place] yesterday and was told there was no heartbeat by doppler; told to follow up with her OB.  She reports there was no bleeding or tissue on pelvic exam that visit and U/S was not done.  Neg for abdominal/pelvic pain. She is a patient of Dr. Verdell Carmine, has initial prenatal care appt for 6/13/.    History reviewed. No pertinent past medical history.  History reviewed. No pertinent surgical history.  Family History  Problem Relation Age of Onset  . Heart disease Father   . Hypertension Maternal Grandfather   . COPD Maternal Grandfather     Social History  Substance Use Topics  . Smoking status: Former Games developer  . Smokeless tobacco: Never Used  . Alcohol use 4.2 oz/week    7 Glasses of wine per week     Comment: social    Allergies:  Allergies  Allergen Reactions  . Penicillins Hives  . Sulfa Antibiotics Hives    Prescriptions Prior to Admission  Medication Sig Dispense Refill Last Dose  . ibuprofen (ADVIL,MOTRIN) 600 MG tablet Take 1 tablet (600 mg total) by mouth every 8 (eight) hours as needed. 15 tablet 0 Taking  . ibuprofen (ADVIL,MOTRIN) 800 MG tablet Take 1 tablet (800 mg total) by mouth every 8 (eight) hours as needed. 30 tablet 5 Taking  . metroNIDAZOLE (FLAGYL) 500 MG tablet Take 1 tablet (500 mg total) by mouth 2 (two) times daily. 14 tablet 2   . oxyCODONE-acetaminophen (PERCOCET) 10-325 MG tablet Take 1 tablet by mouth every 6 (six) hours as needed for pain. 30 tablet 0   . Prenatal-Fe Fum-Methf-FA w/o A (VITAFOL-NANO) 18-0.6-0.4 MG TABS Take 1 tablet by mouth daily  before breakfast. 30 tablet 11   . terconazole (TERAZOL 7) 0.4 % vaginal cream Place 1 applicator vaginally at bedtime. 45 g 0     Review of Systems  Constitutional: Negative for fever.  Respiratory: Negative for shortness of breath.   Cardiovascular: Negative for chest pain.  Gastrointestinal: Negative for abdominal pain.  Genitourinary: Positive for vaginal bleeding (dark brown to pink and back to dark brown.). Negative for dysuria, hematuria and pelvic pain.   Physical Exam   Blood pressure (!) 109/50, pulse 69, temperature 98.4 F (36.9 C), temperature source Oral, resp. rate 16, weight 131 lb 4 oz (59.5 kg), last menstrual period 11/22/2016, SpO2 100 %.  Physical Exam  Nursing note and vitals reviewed. Constitutional: She is oriented to person, place, and time. She appears well-developed and well-nourished. No distress.  HENT:  Head: Normocephalic.  Neck: Normal range of motion.  Cardiovascular: Normal rate.   Respiratory: Effort normal.  GI: Soft. She exhibits no distension and no mass. There is no tenderness. There is no rebound and no guarding.  Genitourinary: There is no rash, tenderness or lesion on the right labia. There is no rash, tenderness or lesion on the left labia. Uterus is enlarged (7-8 week size). Uterus is not tender. Cervix exhibits no motion tenderness, no discharge and no friability. Right adnexum displays no  mass, no tenderness and no fullness. Left adnexum displays no mass, no tenderness and no fullness. There is bleeding (small amount of dark red blood note, several moderate size clots,  Neg for active bleeding.) in the vagina. No tenderness in the vagina. No vaginal discharge found.  Neurological: She is alert and oriented to person, place, and time.  Skin: Skin is warm and dry.  Psychiatric: She has a normal mood and affect. Her behavior is normal.   Results for orders placed or performed during the hospital encounter of 02/17/17 (from the past 24 hour(s))   Urinalysis, Routine w reflex microscopic     Status: Abnormal   Collection Time: 02/17/17 12:30 PM  Result Value Ref Range   Color, Urine YELLOW YELLOW   APPearance HAZY (A) CLEAR   Specific Gravity, Urine 1.019 1.005 - 1.030   pH 5.0 5.0 - 8.0   Glucose, UA NEGATIVE NEGATIVE mg/dL   Hgb urine dipstick LARGE (A) NEGATIVE   Bilirubin Urine NEGATIVE NEGATIVE   Ketones, ur NEGATIVE NEGATIVE mg/dL   Protein, ur NEGATIVE NEGATIVE mg/dL   Nitrite NEGATIVE NEGATIVE   Leukocytes, UA TRACE (A) NEGATIVE   RBC / HPF 6-30 0 - 5 RBC/hpf   WBC, UA 6-30 0 - 5 WBC/hpf   Bacteria, UA FEW (A) NONE SEEN   Squamous Epithelial / LPF 0-5 (A) NONE SEEN   Mucous PRESENT   Wet prep, genital     Status: Abnormal   Collection Time: 02/17/17  6:45 PM  Result Value Ref Range   Yeast Wet Prep HPF POC NONE SEEN NONE SEEN   Trich, Wet Prep NONE SEEN NONE SEEN   Clue Cells Wet Prep HPF POC NONE SEEN NONE SEEN   WBC, Wet Prep HPF POC MODERATE (A) NONE SEEN   Sperm NONE SEEN   CBC with Differential/Platelet     Status: Abnormal   Collection Time: 02/17/17  6:48 PM  Result Value Ref Range   WBC 8.5 4.0 - 10.5 K/uL   RBC 3.97 3.87 - 5.11 MIL/uL   Hemoglobin 11.2 (L) 12.0 - 15.0 g/dL   HCT 16.1 (L) 09.6 - 04.5 %   MCV 85.4 78.0 - 100.0 fL   MCH 28.2 26.0 - 34.0 pg   MCHC 33.0 30.0 - 36.0 g/dL   RDW 40.9 81.1 - 91.4 %   Platelets 277 150 - 400 K/uL   Neutrophils Relative % 52 %   Neutro Abs 4.3 1.7 - 7.7 K/uL   Lymphocytes Relative 37 %   Lymphs Abs 3.2 0.7 - 4.0 K/uL   Monocytes Relative 7 %   Monocytes Absolute 0.6 0.1 - 1.0 K/uL   Eosinophils Relative 4 %   Eosinophils Absolute 0.4 0.0 - 0.7 K/uL   Basophils Relative 0 %   Basophils Absolute 0.0 0.0 - 0.1 K/uL   BLOOD TYPE on PREVIOUS VISIT: O Positive  MAU Course  Procedures  MDM MSE LABS EXAM U/S Discussed plan of care # expected management, #2 CYTOtec # to call Dr. Clearance Coots on Monday if she has not miscarried to discuss D&E.     Assessment and Plan  A Vaginal bleeding in first trimester pregnancy    Failed pregnancy--no cardiac activity measuring [redacted]w[redacted]d     P:  It is her clear choice to wait to see if she miscarries over the weekend and if she doesn't will call Dr. Clearance Coots on Monday to make appt to discuss surgery.  She was instructed to return to MAU over the  weekend for heavy bleeding or mod-severe abdominal pain.  She agrees to return if she has these sxs.   Dennison Mascotve M Key 02/17/2017, 6:29 PM

## 2017-02-17 NOTE — Discharge Instructions (Signed)
Vaginal Bleeding During Pregnancy, First Trimester °A small amount of bleeding (spotting) from the vagina is common in early pregnancy. Sometimes the bleeding is normal and is not a problem, and sometimes it is a sign of something serious. Be sure to tell your doctor about any bleeding from your vagina right away. °Follow these instructions at home: °· Watch your condition for any changes. °· Follow your doctor's instructions about how active you can be. °· If you are on bed rest: °? You may need to stay in bed and only get up to use the bathroom. °? You may be allowed to do some activities. °? If you need help, make plans for someone to help you. °· Write down: °? The number of pads you use each day. °? How often you change pads. °? How soaked (saturated) your pads are. °· Do not use tampons. °· Do not douche. °· Do not have sex or orgasms until your doctor says it is okay. °· If you pass any tissue from your vagina, save the tissue so you can show it to your doctor. °· Only take medicines as told by your doctor. °· Do not take aspirin because it can make you bleed. °· Keep all follow-up visits as told by your doctor. °Contact a doctor if: °· You bleed from your vagina. °· You have cramps. °· You have labor pains. °· You have a fever that does not go away after you take medicine. °Get help right away if: °· You have very bad cramps in your back or belly (abdomen). °· You pass large clots or tissue from your vagina. °· You bleed more. °· You feel light-headed or weak. °· You pass out (faint). °· You have chills. °· You are leaking fluid or have a gush of fluid from your vagina. °· You pass out while pooping (having a bowel movement). °This information is not intended to replace advice given to you by your health care provider. Make sure you discuss any questions you have with your health care provider. °Document Released: 01/20/2014 Document Revised: 02/11/2016 Document Reviewed: 05/13/2013 °Elsevier Interactive  Patient Education © 2018 [employer] ° °

## 2017-02-17 NOTE — MAU Note (Signed)
Been spotting, dark brown since Monday.  Saw pink yesterday, back to brown.  Was seen at [place]gent [place] in [place] yesterday, no FH, was told to follow up with provider, but the office is closed today. No pain

## 2017-02-18 LAB — HIV ANTIBODY (ROUTINE TESTING W REFLEX): HIV Screen 4th Generation wRfx: NONREACTIVE

## 2017-02-20 LAB — GC/CHLAMYDIA PROBE AMP (~~LOC~~) NOT AT ARMC
CHLAMYDIA, DNA PROBE: NEGATIVE
Neisseria Gonorrhea: NEGATIVE

## 2017-02-21 ENCOUNTER — Encounter: Payer: Self-pay | Admitting: Obstetrics

## 2017-02-21 ENCOUNTER — Ambulatory Visit (INDEPENDENT_AMBULATORY_CARE_PROVIDER_SITE_OTHER): Payer: Medicaid Other | Admitting: Obstetrics

## 2017-02-21 VITALS — BP 98/60 | HR 64 | Wt 127.6 lb

## 2017-02-21 DIAGNOSIS — O021 Missed abortion: Secondary | ICD-10-CM

## 2017-02-21 MED ORDER — OXYCODONE-ACETAMINOPHEN 10-325 MG PO TABS: 1.0000 | ORAL_TABLET | ORAL | 0 refills | Status: DC | PRN

## 2017-02-21 NOTE — Progress Notes (Signed)
Patient ID: Tammie Martinez, female   DOB: 1991-06-05, 26 y.o.   MRN: [id number]  Chief Complaint  Patient presents with  . Gynecologic Exam    follow up hospital visit    HPI Tammie Martinez is a 26 y.o. female.  Vaginal spotting.  [facility] and cramping, and told that she has a probable Missed Abortion.  U/S showed a intrauterine gestational sac with 8 week embryo with no cardiac activity.  Presents today for follow up. HPI  History reviewed. No pertinent past medical history.  History reviewed. No pertinent surgical history.  Family History  Problem Relation Age of Onset  . Heart disease Father   . Hypertension Maternal Grandfather   . COPD Maternal Grandfather     Social History Social History  Substance Use Topics  . Smoking status: Former Games developermoker  . Smokeless tobacco: Never Used  . Alcohol use 4.2 oz/week    7 Glasses of wine per week     Comment: social    Allergies  Allergen Reactions  . Penicillins Hives    Has patient had a PCN reaction causing immediate rash, facial/tongue/throat swelling, SOB or lightheadedness with hypotension: Yes Has patient had a PCN reaction causing severe rash involving mucus membranes or skin necrosis: Yes Has patient had a PCN reaction that required hospitalization: No Has patient had a PCN reaction occurring within the last 10 years: No If all of the above answers are "NO", then may proceed with Cephalosporin use.   . Sulfa Antibiotics Hives    Current Outpatient Prescriptions  Medication Sig Dispense Refill  . oxyCODONE-acetaminophen (PERCOCET) 10-325 MG tablet Take 1 tablet by mouth every 4 (four) hours as needed for pain. 30 tablet 0  . Prenatal-Fe Fum-Methf-FA w/o A (VITAFOL-NANO) 18-0.6-0.4 MG TABS Take 1 tablet by mouth daily before breakfast. (Patient not taking: Reported on 02/17/2017) 30 tablet 11  . terconazole (TERAZOL 7) 0.4 % vaginal cream Place 1 applicator vaginally at bedtime. (Patient not taking:  Reported on 02/17/2017) 45 g 0   No current facility-administered medications for this visit.     Review of Systems Review of Systems Constitutional: negative for fatigue and weight loss Respiratory: negative for cough and wheezing Cardiovascular: negative for chest pain, fatigue and palpitations Gastrointestinal: negative for abdominal pain and change in bowel habits Genitourinary:positive for vaginal spotting Integument/breast: negative for nipple discharge Musculoskeletal:negative for myalgias Neurological: negative for gait problems and tremors Behavioral/Psych: negative for abusive relationship, depression Endocrine: negative for temperature intolerance      Blood pressure 98/60, pulse 64, weight 127 lb 9.6 oz (57.9 kg), last menstrual period 11/22/2016, unknown if currently breastfeeding.  Physical Exam Physical Exam General:   alert  Skin:   no rash or abnormalities  Lungs:   clear to auscultation bilaterally  Heart:   regular rate and rhythm, S1, S2 normal, no murmur, click, rub or gallop  Breasts:   normal without suspicious masses, skin or nipple changes or axillary nodes  Abdomen:  normal findings: no organomegaly, soft, non-tender and no hernia  Pelvis:  External genitalia: normal general appearance Urinary system: urethral meatus normal and bladder without fullness, nontender Vaginal: normal without tenderness, induration or masses Cervix: normal appearance Adnexa: normal bimanual exam Uterus: anteverted and non-tender, normal size    >50% of 10 min visit spent on counseling and coordination of care.    Data Reviewed Ultrasound Labs      US OB Transvaginal (Accession [id number]) (Order [id number])  Imaging  Date: 02/17/2017  Department: THE [facility] [place] MATERNITY ADMISSIONS Released By/Authorizing: Key, Verita Schneiders, NP (auto-released)  Exam Information   Status Exam Begun  Exam Ended   Final [99] 02/17/2017 7:04 PM 02/17/2017 7:31 PM  PACS Images    Show images for US OB Transvaginal  Study Result   CLINICAL DATA:  Pregnant patient with vaginal bleeding.  EXAM: OBSTETRIC <14 WK [place] AND TRANSVAGINAL OB US  TECHNIQUE: Both transabdominal and transvaginal ultrasound examinations were performed for complete evaluation of the gestation as well as the maternal uterus, adnexal regions, and pelvic cul-de-sac. Transvaginal technique was performed to assess early pregnancy.  COMPARISON:  None.  FINDINGS: Intrauterine gestational sac: Visualized. The sac has somewhat irregular borders.  Yolk sac:  Not visualized.  Embryo:  Visualized.  Cardiac Activity: Not detected.  CRL:  18  mm   8 w   1 d  Subchorionic hemorrhage:  None visualized.  Maternal uterus/adnexae: Unremarkable.  No free fluid.  IMPRESSION: Findings meet definitive criteria for failed pregnancy. This follows SRU consensus guidelines: Diagnostic Criteria for Nonviable Pregnancy Early in the First Trimester. [facility] [phone].   Electronically Signed   By: Drusilla Kanner M.D.   On: 02/17/2017 19:36   Result History   US OB Transvaginal (Order #[id number]) on 02/17/2017 - Order Result History Report  Vitals   Height Weight BMI (Calculated)   127 lb 9.6 oz (57.9 kg)   External Result Report   External Result Report  Imaging   Imaging Information  Signed by   Signed Date/Time  Phone Pager  Holley Dexter 02/17/2017 7:36 PM [phone]  Signed   Electronically signed by Holley Dexter, MD on 02/17/17 at 1936 EDT  Original Order   Ordered On Ordered By   02/17/2017 6:50 PM Key, Verita Schneiders, NP           Assessment     Probable Missed Abortion    Plan    Quantitative beta Hcg today and repeat in 48 hours to confirm embryonic demise  Orders Placed This Encounter  Procedures  . Beta HCG, Quant    Standing Status:   Future    Standing Expiration Date:   02/21/2018  . Hemoglobin A1c  . Beta HCG,  Quant   Meds ordered this encounter  Medications  . oxyCODONE-acetaminophen (PERCOCET) 10-325 MG tablet    Sig: Take 1 tablet by mouth every 4 (four) hours as needed for pain.    Dispense:  30 tablet    Refill:  0    Patient ID: Tammie Martinez, female   DOB: 04-11-1991, 26 y.o.   MRN: [id number]

## 2017-02-21 NOTE — Progress Notes (Signed)
Patient is in the hospital for follow up to SAB. Patient started bleeding and she went to the hospital. US showed no cardiac activity. Patient had cramping that was worse after her visit to the hospital but she is not sure that she has completely passed her pregnancy.

## 2017-02-21 NOTE — Patient Instructions (Addendum)
Miscarriage A miscarriage is the sudden loss of an unborn baby (fetus) before the 20th week of pregnancy. Most miscarriages happen in the first 3 months of pregnancy. Sometimes, it happens before a woman even knows she is pregnant. A miscarriage is also called a "spontaneous miscarriage" or "early pregnancy loss." Having a miscarriage can be an emotional experience. Talk with your caregiver about any questions you may have about miscarrying, the grieving process, and your future pregnancy plans. What are the causes?  Problems with the fetal chromosomes that make it impossible for the baby to develop normally. Problems with the baby's genes or chromosomes are most often the result of errors that occur, by chance, as the embryo divides and grows. The problems are not inherited from the parents.  Infection of the cervix or uterus.  Hormone problems.  Problems with the cervix, such as having an incompetent cervix. This is when the tissue in the cervix is not strong enough to hold the pregnancy.  Problems with the uterus, such as an abnormally shaped uterus, uterine fibroids, or congenital abnormalities.  Certain medical conditions.  Smoking, drinking alcohol, or taking illegal drugs.  Trauma. Often, the cause of a miscarriage is unknown. What are the signs or symptoms?  Vaginal bleeding or spotting, with or without cramps or pain.  Pain or cramping in the abdomen or lower back.  Passing fluid, tissue, or blood clots from the vagina. How is this diagnosed? Your caregiver will perform a physical exam. You may also have an ultrasound to confirm the miscarriage. Blood or urine tests may also be ordered. How is this treated?  Sometimes, treatment is not necessary if you naturally pass all the fetal tissue that was in the uterus. If some of the fetus or placenta remains in the body (incomplete miscarriage), tissue left behind may become infected and must be removed. Usually, a dilation and  curettage (D and C) procedure is performed. During a D and C procedure, the cervix is widened (dilated) and any remaining fetal or placental tissue is gently removed from the uterus.  Antibiotic medicines are prescribed if there is an infection. Other medicines may be given to reduce the size of the uterus (contract) if there is a lot of bleeding.  If you have Rh negative blood and your baby was Rh positive, you will need a Rh immunoglobulin shot. This shot will protect any future baby from having Rh blood problems in future pregnancies. Follow these instructions at home:  Your caregiver may order bed rest or may allow you to continue light activity. Resume activity as directed by your caregiver.  Have someone help with home and family responsibilities during this time.  Keep track of the number of sanitary pads you use each day and how soaked (saturated) they are. Write down this information.  Do not use tampons. Do not douche or have sexual intercourse until approved by your caregiver.  Only take over-the-counter or prescription medicines for pain or discomfort as directed by your caregiver.  Do not take aspirin. Aspirin can cause bleeding.  Keep all follow-up appointments with your caregiver.  If you or your partner have problems with grieving, talk to your caregiver or seek counseling to help cope with the pregnancy loss. Allow enough time to grieve before trying to get pregnant again. Get help right away if:  You have severe cramps or pain in your back or abdomen.  You have a fever.  You pass large blood clots (walnut-sized or larger) ortissue from your   vagina. Save any tissue for your caregiver to inspect.  Your bleeding increases.  You have a thick, bad-smelling vaginal discharge.  You become lightheaded, weak, or you faint.  You have chills. This information is not intended to replace advice given to you by your health care provider. Make sure you discuss any questions  you have with your health care provider. Document Released: 03/01/2001 Document Revised: 02/11/2016 Document Reviewed: 10/25/2011 Elsevier Interactive Patient Education  2017 Elsevier Inc. Threatened Miscarriage A threatened miscarriage occurs when you have vaginal bleeding during your first 20 weeks of pregnancy but the pregnancy has not ended. If you have vaginal bleeding during this time, your health care provider will do tests to make sure you are still pregnant. If the tests show you are still pregnant and the developing baby (fetus) inside your womb (uterus) is still growing, your condition is considered a threatened miscarriage. A threatened miscarriage does not mean your pregnancy will end, but it does increase the risk of losing your pregnancy (complete miscarriage). What are the causes? The cause of a threatened miscarriage is usually not known. If you go on to have a complete miscarriage, the most common cause is an abnormal number of chromosomes in the developing baby. Chromosomes are the structures inside cells that hold all your genetic material. Some causes of vaginal bleeding that do not result in miscarriage include:  Having sex.  Having an infection.  Normal hormone changes of pregnancy.  Bleeding that occurs when an egg implants in your uterus.  What increases the risk? Risk factors for bleeding in early pregnancy include:  Obesity.  Smoking.  Drinking excessive amounts of alcohol or caffeine.  Recreational drug use.  What are the signs or symptoms?  Light vaginal bleeding.  Mild abdominal pain or cramps. How is this diagnosed? If you have bleeding with or without abdominal pain before 20 weeks of pregnancy, your health care provider will do tests to check whether you are still pregnant. One important test involves using sound waves and a computer (ultrasound) to create images of the inside of your uterus. Other tests include an internal exam of your vagina and  uterus (pelvic exam) and measurement of your baby's heart rate. You may be diagnosed with a threatened miscarriage if:  Ultrasound testing shows you are still pregnant.  Your baby's heart rate is strong.  A pelvic exam shows that the opening between your uterus and your vagina (cervix) is closed.  Your heart rate and blood pressure are stable.  Blood tests confirm you are still pregnant.  How is this treated? No treatments have been shown to prevent a threatened miscarriage from going on to a complete miscarriage. However, the right home care is important. Follow these instructions at home:  Make sure you keep all your appointments for prenatal care. This is very important.  Get plenty of rest.  Do not have sex or use tampons if you have vaginal bleeding.  Do not douche.  Do not smoke or use recreational drugs.  Do not drink alcohol.  Avoid caffeine. Contact a health care provider if:  You have light vaginal bleeding or spotting while pregnant.  You have abdominal pain or cramping.  You have a fever. Get help right away if:  You have heavy vaginal bleeding.  You have blood clots coming from your vagina.  You have severe low back pain or abdominal cramps.  You have fever, chills, and severe abdominal pain. This information is not intended to replace advice given  to you by your health care provider. Make sure you discuss any questions you have with your health care provider. Document Released: 09/05/2005 Document Revised: 02/11/2016 Document Reviewed: 07/02/2013 Elsevier Interactive Patient Education  [employer]2018 Elsevier Inc.

## 2017-02-22 ENCOUNTER — Other Ambulatory Visit: Payer: Self-pay | Admitting: Obstetrics

## 2017-02-22 LAB — BETA HCG QUANT (REF LAB): hCG Quant: 941 m[IU]/mL

## 2017-02-22 LAB — HEMOGLOBIN A1C
ESTIMATED AVERAGE GLUCOSE: 88 mg/dL
HEMOGLOBIN A1C: 4.7 % — AB (ref 4.8–5.6)

## 2017-02-23 ENCOUNTER — Other Ambulatory Visit: Payer: Medicaid Other

## 2017-02-23 DIAGNOSIS — O021 Missed abortion: Secondary | ICD-10-CM

## 2017-02-24 LAB — BETA HCG QUANT (REF LAB): hCG Quant: 672 m[IU]/mL

## 2017-02-25 IMAGING — CR DG CHEST 2V
2 series · 2 of 2 positions shown · non-contrast
Comparison: Radiograph dated 10/16/2012

CLINICAL DATA: 24-year-old female with chest pain

EXAM:
CHEST  2 VIEW

[chest pa]
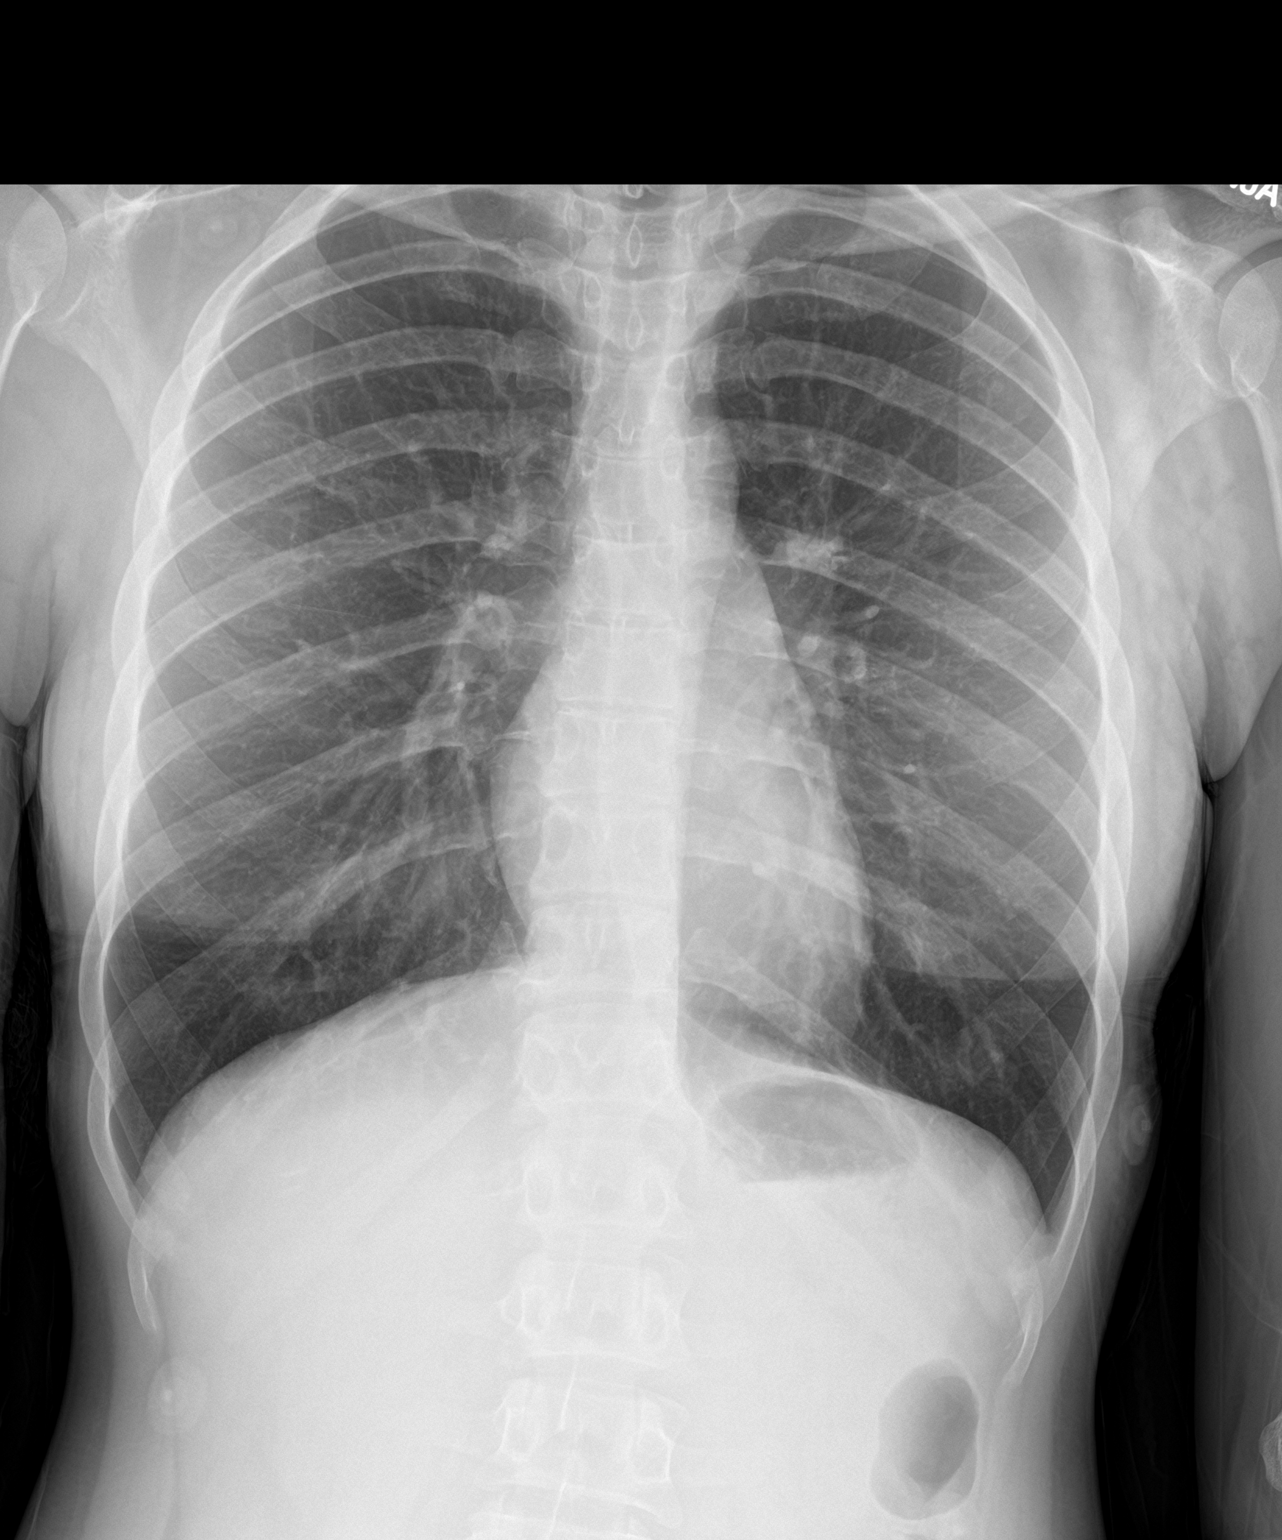

[chest lat]
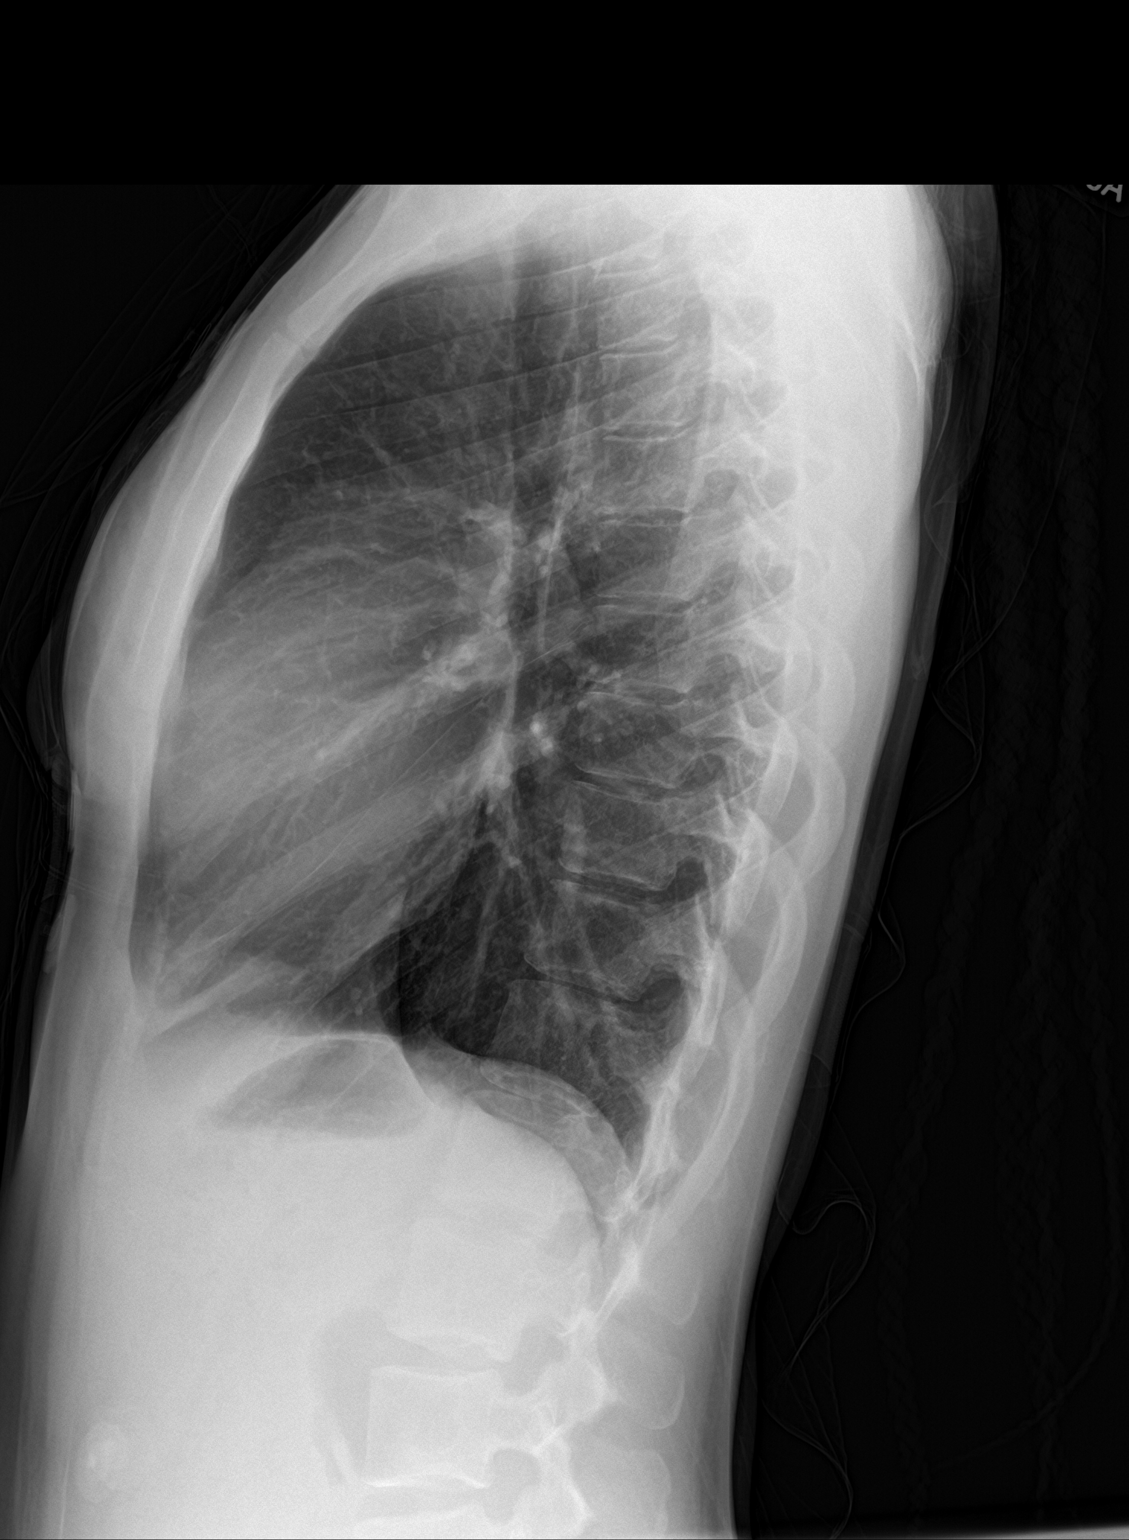

[2 of 2 positions shown; findings below may reference images not displayed]

FINDINGS: The heart size and mediastinal contours are within normal limits.
Both lungs are clear. The visualized skeletal structures are
unremarkable. Small stable left hilar granuloma.
IMPRESSION: No active cardiopulmonary disease.

## 2017-02-28 ENCOUNTER — Ambulatory Visit: Payer: Medicaid Other | Admitting: Obstetrics

## 2017-03-01 ENCOUNTER — Encounter: Payer: Medicaid Other | Admitting: Obstetrics

## 2017-03-02 ENCOUNTER — Telehealth: Payer: Self-pay

## 2017-03-02 MED ORDER — FLUCONAZOLE 150 MG PO TABS: 150.0000 mg | ORAL_TABLET | Freq: Once | ORAL | 1 refills | Status: AC

## 2017-03-02 NOTE — Telephone Encounter (Signed)
Pt states that she is having a thick white discharge, and vaginal irritation. Pt would like rx sent in for yeast. Pt is not pregnant. Episode needs to be resolved. Diflucan rx sent to pharmacy per protocol

## 2017-04-11 ENCOUNTER — Ambulatory Visit (INDEPENDENT_AMBULATORY_CARE_PROVIDER_SITE_OTHER): Payer: Medicaid Other | Admitting: Obstetrics

## 2017-04-11 ENCOUNTER — Encounter: Payer: Self-pay | Admitting: Obstetrics

## 2017-04-11 VITALS — BP 116/66 | HR 92 | Wt 135.0 lb

## 2017-04-11 DIAGNOSIS — R102 Pelvic and perineal pain: Secondary | ICD-10-CM

## 2017-04-11 DIAGNOSIS — N939 Abnormal uterine and vaginal bleeding, unspecified: Secondary | ICD-10-CM | POA: Diagnosis not present

## 2017-04-11 DIAGNOSIS — O021 Missed abortion: Secondary | ICD-10-CM | POA: Diagnosis not present

## 2017-04-11 DIAGNOSIS — Z9889 Other specified postprocedural states: Secondary | ICD-10-CM

## 2017-04-11 NOTE — Progress Notes (Signed)
Patient ID: Tammie Martinez, female   DOB: August 22, 1991, 26 y.o.   MRN: [id number]  Chief Complaint  Patient presents with  . Gynecologic Exam    HPI Tammie Martinez is a 26 y.o. female.  Had spontaneous abortion on 02/16/17.  Has been bleeding and cramping off and on since SAB.  Now bleeding heavier with clots and tissue.  Denies fever, chills, or dysuria. HPI  History reviewed. No pertinent past medical history.  History reviewed. No pertinent surgical history.  Family History  Problem Relation Age of Onset  . Heart disease Father   . Hypertension Maternal Grandfather   . COPD Maternal Grandfather     Social History Social History  Substance Use Topics  . Smoking status: Former Games developermoker  . Smokeless tobacco: Never Used  . Alcohol use 4.2 oz/week    7 Glasses of wine per week     Comment: social    Allergies  Allergen Reactions  . Penicillins Hives    Has patient had a PCN reaction causing immediate rash, facial/tongue/throat swelling, SOB or lightheadedness with hypotension: Yes Has patient had a PCN reaction causing severe rash involving mucus membranes or skin necrosis: Yes Has patient had a PCN reaction that required hospitalization: No Has patient had a PCN reaction occurring within the last 10 years: No If all of the above answers are "NO", then may proceed with Cephalosporin use.   . Sulfa Antibiotics Hives    Current Outpatient Prescriptions  Medication Sig Dispense Refill  . oxyCODONE-acetaminophen (PERCOCET) 10-325 MG tablet Take 1 tablet by mouth every 4 (four) hours as needed for pain. (Patient not taking: Reported on 04/11/2017) 30 tablet 0  . Prenatal-Fe Fum-Methf-FA w/o A (VITAFOL-NANO) 18-0.6-0.4 MG TABS Take 1 tablet by mouth daily before breakfast. (Patient not taking: Reported on 02/17/2017) 30 tablet 11  . terconazole (TERAZOL 7) 0.4 % vaginal cream Place 1 applicator vaginally at bedtime. (Patient not taking: Reported on 02/17/2017) 45 g 0   No current  facility-administered medications for this visit.     Review of Systems Review of Systems Constitutional: negative for fatigue and weight loss Respiratory: negative for cough and wheezing Cardiovascular: negative for chest pain, fatigue and palpitations Gastrointestinal: negative for abdominal pain and change in bowel habits Genitourinary:positive for AUB after SAB Integument/breast: negative for nipple discharge Musculoskeletal:negative for myalgias Neurological: negative for gait problems and tremors Behavioral/Psych: negative for abusive relationship, depression Endocrine: negative for temperature intolerance      Blood pressure 116/66, pulse 92, weight 135 lb (61.2 kg), last menstrual period 11/22/2016, not currently breastfeeding.  Physical Exam Physical Exam:          General:  Alert and no distress Abdomen:  normal findings: no organomegaly, soft, non-tender and no hernia  Pelvis:  External genitalia: normal general appearance Urinary system: urethral meatus normal and bladder without fullness, nontender Vaginal: normal without tenderness, induration or masses Cervix: normal appearance Adnexa: normal bimanual exam Uterus: anteverted and non-tender, normal size    50% of 15 min visit spent on counseling and coordination of care.    Data Reviewed Labs  Assessment     1. Missed abortion   2. S/P D&C (status post dilation and curettage)   3. Vaginal bleeding, abnormal   4. Pelvic pain in female     Plan    [facility] labs and possible ultrasound for retained POC Would be unusual to have retained POC 2 months out with a closed cervix, small and non  tender uterus; but she has AUB   No orders of the defined types were placed in this encounter.  No orders of the defined types were placed in this encounter.            Patient ID: Tammie Martinez, female   DOB: [birth year], 26 y.o.   MRN: [id number]

## 2017-04-11 NOTE — Progress Notes (Signed)
Patient had SAB- 5/31. Patient state she has been bleeding every day. Patient reports she spotted for 60 days- the bleeding was never heavy. Patient passed tissue 6/30. Patient has continued to spot .7/18 She stopped bleeding. Bleeding restarted 7/23 with tissue and heavy.

## 2017-05-23 ENCOUNTER — Telehealth: Payer: Self-pay

## 2017-05-23 DIAGNOSIS — B9689 Other specified bacterial agents as the cause of diseases classified elsewhere: Secondary | ICD-10-CM

## 2017-05-23 DIAGNOSIS — N760 Acute vaginitis: Secondary | ICD-10-CM

## 2017-05-23 DIAGNOSIS — B379 Candidiasis, unspecified: Secondary | ICD-10-CM

## 2017-05-23 MED ORDER — FLUCONAZOLE 150 MG PO TABS: 150.0000 mg | ORAL_TABLET | Freq: Once | ORAL | 0 refills | Status: AC

## 2017-05-23 MED ORDER — METRONIDAZOLE 500 MG PO TABS: 500.0000 mg | ORAL_TABLET | Freq: Two times a day (BID) | ORAL | 0 refills | Status: AC

## 2017-05-23 NOTE — Telephone Encounter (Signed)
Pt called requesting a refill for flagyl. States that she is having a vaginal discharge with a fishy odor. Sx has been present since Friday. Per protocol rx for flagyl and yeast sent to pharmacy

## 2017-07-13 ENCOUNTER — Inpatient Hospital Stay (HOSPITAL_COMMUNITY): Payer: Medicaid Other

## 2017-07-13 ENCOUNTER — Inpatient Hospital Stay (HOSPITAL_COMMUNITY)
Admission: AD | Admit: 2017-07-13 | Discharge: 2017-07-13 | Disposition: A | Discharge status: Home / Self Care | Payer: Medicaid Other | Source: Ambulatory Visit | Attending: Obstetrics and Gynecology | Admitting: Obstetrics and Gynecology

## 2017-07-13 ENCOUNTER — Encounter (HOSPITAL_COMMUNITY): Payer: Self-pay | Admitting: *Deleted

## 2017-07-13 DIAGNOSIS — Z8249 Family history of ischemic heart disease and other diseases of the circulatory system: Secondary | ICD-10-CM | POA: Insufficient documentation

## 2017-07-13 DIAGNOSIS — F17210 Nicotine dependence, cigarettes, uncomplicated: Secondary | ICD-10-CM | POA: Insufficient documentation

## 2017-07-13 DIAGNOSIS — O26891 Other specified pregnancy related conditions, first trimester: Secondary | ICD-10-CM

## 2017-07-13 DIAGNOSIS — O209 Hemorrhage in early pregnancy, unspecified: Secondary | ICD-10-CM | POA: Diagnosis not present

## 2017-07-13 DIAGNOSIS — Z882 Allergy status to sulfonamides status: Secondary | ICD-10-CM | POA: Diagnosis not present

## 2017-07-13 DIAGNOSIS — Z3A01 Less than 8 weeks gestation of pregnancy: Secondary | ICD-10-CM | POA: Diagnosis not present

## 2017-07-13 DIAGNOSIS — Z3201 Encounter for pregnancy test, result positive: Secondary | ICD-10-CM | POA: Diagnosis not present

## 2017-07-13 DIAGNOSIS — Z836 Family history of other diseases of the respiratory system: Secondary | ICD-10-CM | POA: Insufficient documentation

## 2017-07-13 DIAGNOSIS — R109 Unspecified abdominal pain: Secondary | ICD-10-CM | POA: Diagnosis not present

## 2017-07-13 DIAGNOSIS — O99331 Smoking (tobacco) complicating pregnancy, first trimester: Secondary | ICD-10-CM | POA: Insufficient documentation

## 2017-07-13 DIAGNOSIS — R42 Dizziness and giddiness: Secondary | ICD-10-CM | POA: Diagnosis present

## 2017-07-13 DIAGNOSIS — Z880 Allergy status to penicillin: Secondary | ICD-10-CM | POA: Diagnosis not present

## 2017-07-13 DIAGNOSIS — N939 Abnormal uterine and vaginal bleeding, unspecified: Secondary | ICD-10-CM | POA: Diagnosis present

## 2017-07-13 DIAGNOSIS — O26899 Other specified pregnancy related conditions, unspecified trimester: Secondary | ICD-10-CM

## 2017-07-13 HISTORY — DX: Other specified health status: Z78.9

## 2017-07-13 LAB — HCG, QUANTITATIVE, PREGNANCY: hCG, Beta Chain, Quant, S: 41026 m[IU]/mL — ABNORMAL HIGH (ref <5)

## 2017-07-13 LAB — URINALYSIS, ROUTINE W REFLEX MICROSCOPIC
Bilirubin Urine: NEGATIVE
Glucose, UA: NEGATIVE mg/dL
HGB URINE DIPSTICK: NEGATIVE
Ketones, ur: NEGATIVE mg/dL
LEUKOCYTES UA: NEGATIVE
NITRITE: NEGATIVE
PH: 7 (ref 5.0–8.0)
Protein, ur: 30 mg/dL — AB
SPECIFIC GRAVITY, URINE: 1.027 (ref 1.005–1.030)

## 2017-07-13 LAB — WET PREP, GENITAL
Clue Cells Wet Prep HPF POC: NONE SEEN
Sperm: NONE SEEN
Trich, Wet Prep: NONE SEEN
Yeast Wet Prep HPF POC: NONE SEEN

## 2017-07-13 LAB — POCT PREGNANCY, URINE: PREG TEST UR: POSITIVE — AB

## 2017-07-13 LAB — CBC
HCT: 35.2 % — ABNORMAL LOW (ref 36.0–46.0)
Hemoglobin: 11.6 g/dL — ABNORMAL LOW (ref 12.0–15.0)
MCH: 28.4 pg (ref 26.0–34.0)
MCHC: 33 g/dL (ref 30.0–36.0)
MCV: 86.3 fL (ref 78.0–100.0)
PLATELETS: 295 10*3/uL (ref 150–400)
RBC: 4.08 MIL/uL (ref 3.87–5.11)
RDW: 12.9 % (ref 11.5–15.5)
WBC: 11.1 10*3/uL — ABNORMAL HIGH (ref 4.0–10.5)

## 2017-07-13 LAB — ABO/RH: ABO/RH(D): O POS

## 2017-07-13 NOTE — Discharge Instructions (Signed)

## 2017-07-13 NOTE — MAU Note (Signed)
Pt states she has had diarrhea every day for the last 3 weeks, has had 3 episodes today, also vomited twice today.  Had a miscarriage in July, has had irregular bleeding ever since.

## 2017-07-13 NOTE — MAU Note (Signed)
Vaginal bleeding started three days ago, sexual intercourse 4 days ago.  Feeling dizzy for about three weeks. Pt. Is not self medicating for pain. Home pregnancy test x1 - positive.

## 2017-07-13 NOTE — MAU Provider Note (Signed)
History     CSN: [id number]  Arrival date and time: 07/13/17 1655   First Provider Initiated Contact with Patient 07/13/17 1745      Chief Complaint  Patient presents with  . Vaginal Bleeding  . Possible Pregnancy  . Dizziness   HPI   [name] is a 26 y.o. female [id number] @ [redacted]w[redacted]d here in MAU with dizziness and possible pregnancy. States that she had a miscarriage back in June however never followed up in the office. States she has continued to pass tissue weeks after her miscarriage. States she has had irregular bleeding since her miscarriage. The bleeding will start and then stop. The bleeding is very light. She is not having any pain. + dizziness. States the dizziness occurs all the time. States she is not eating and drinking like normal. No bleeding at this time. Desired pregnancy.   OB History    Gravida Para Term Preterm AB Living   4 1 1   2 1    SAB TAB Ectopic Multiple Live Births   2       1      Past Medical History:  Diagnosis Date  . Medical history non-contributory     Past Surgical History:  Procedure Laterality Date  . NO PAST SURGERIES      Family History  Problem Relation Age of Onset  . Heart disease Father   . Hypertension Maternal Grandfather   . COPD Maternal Grandfather     Social History  Substance Use Topics  . Smoking status: Current Every Day Smoker    Packs/day: 0.25  . Smokeless tobacco: Never Used  . Alcohol use 4.2 oz/week    7 Glasses of wine per week     Comment: drinks on weekends    Allergies:  Allergies  Allergen Reactions  . Penicillins Hives    Has patient had a PCN reaction causing immediate rash, facial/tongue/throat swelling, SOB or lightheadedness with hypotension: Yes Has patient had a PCN reaction causing severe rash involving mucus membranes or skin necrosis: Yes Has patient had a PCN reaction that required hospitalization: No Has patient had a PCN reaction occurring within the last 10 years: No If all of  the above answers are "NO", then may proceed with Cephalosporin use.   . Sulfa Antibiotics Hives    Prescriptions Prior to Admission  Medication Sig Dispense Refill Last Dose  . oxyCODONE-acetaminophen (PERCOCET) 10-325 MG tablet Take 1 tablet by mouth every 4 (four) hours as needed for pain. (Patient not taking: Reported on 04/11/2017) 30 tablet 0 Not Taking  . Prenatal-Fe Fum-Methf-FA w/o A (VITAFOL-NANO) 18-0.6-0.4 MG TABS Take 1 tablet by mouth daily before breakfast. (Patient not taking: Reported on 02/17/2017) 30 tablet 11 Not Taking  . terconazole (TERAZOL 7) 0.4 % vaginal cream Place 1 applicator vaginally at bedtime. (Patient not taking: Reported on 02/17/2017) 45 g 0 Not Taking   Results for orders placed or performed during the hospital encounter of 07/13/17 (from the past 48 hour(s))  Urinalysis, Routine w reflex microscopic     Status: Abnormal   Collection Time: 07/13/17  5:11 PM  Result Value Ref Range   Color, Urine YELLOW YELLOW   APPearance HAZY (A) CLEAR   Specific Gravity, Urine 1.027 1.005 - 1.030   pH 7.0 5.0 - 8.0   Glucose, UA NEGATIVE NEGATIVE mg/dL   Hgb urine dipstick NEGATIVE NEGATIVE   Bilirubin Urine NEGATIVE NEGATIVE   Ketones, ur NEGATIVE NEGATIVE mg/dL   Protein, ur 30 (A)  NEGATIVE mg/dL   Nitrite NEGATIVE NEGATIVE   Leukocytes, UA NEGATIVE NEGATIVE   RBC / HPF 0-5 0 - 5 RBC/hpf   WBC, UA 0-5 0 - 5 WBC/hpf   Bacteria, UA RARE (A) NONE SEEN   Squamous Epithelial / LPF 6-30 (A) NONE SEEN   Mucus PRESENT   Pregnancy, urine POC     Status: Abnormal   Collection Time: 07/13/17  5:18 PM  Result Value Ref Range   Preg Test, Ur POSITIVE (A) NEGATIVE    Comment:        THE SENSITIVITY OF THIS METHODOLOGY IS >24 mIU/mL   CBC     Status: Abnormal   Collection Time: 07/13/17  6:01 PM  Result Value Ref Range   WBC 11.1 (H) 4.0 - 10.5 K/uL   RBC 4.08 3.87 - 5.11 MIL/uL   Hemoglobin 11.6 (L) 12.0 - 15.0 g/dL   HCT 16.1 (L) 09.6 - 04.5 %   MCV 86.3 78.0 -  100.0 fL   MCH 28.4 26.0 - 34.0 pg   MCHC 33.0 30.0 - 36.0 g/dL   RDW 40.9 81.1 - 91.4 %   Platelets 295 150 - 400 K/uL  ABO/Rh     Status: None   Collection Time: 07/13/17  6:01 PM  Result Value Ref Range   ABO/RH(D) O POS   hCG, quantitative, pregnancy     Status: Abnormal   Collection Time: 07/13/17  6:01 PM  Result Value Ref Range   hCG, Beta Chain, Quant, S 41,026 (H) <5 mIU/mL    Comment:          GEST. AGE      CONC.  (mIU/mL)   <=1 WEEK        5 - 50     2 WEEKS       50 - 500     3 WEEKS       100 - 10,000     4 WEEKS     1,000 - 30,000     5 WEEKS     3,500 - 115,000   6-8 WEEKS     12,000 - 270,000    12 WEEKS     15,000 - 220,000        FEMALE AND NON-PREGNANT FEMALE:     LESS THAN 5 mIU/mL   Wet prep, genital     Status: Abnormal   Collection Time: 07/13/17  7:12 PM  Result Value Ref Range   Yeast Wet Prep HPF POC NONE SEEN NONE SEEN   Trich, Wet Prep NONE SEEN NONE SEEN   Clue Cells Wet Prep HPF POC NONE SEEN NONE SEEN   WBC, Wet Prep HPF POC FEW (A) NONE SEEN    Comment: MANY BACTERIA SEEN   Sperm NONE SEEN    US Ob Comp Less 14 Wks  Result Date: 07/13/2017 CLINICAL DATA:  Heavy bleeding EXAM: OBSTETRIC <14 WK [place] AND TRANSVAGINAL OB US TECHNIQUE: Both transabdominal and transvaginal ultrasound examinations were performed for complete evaluation of the gestation as well as the maternal uterus, adnexal regions, and pelvic cul-de-sac. Transvaginal technique was performed to assess early pregnancy. COMPARISON:  None. FINDINGS: Intrauterine gestational sac: Single intrauterine gestational sac Yolk sac:  Visible Embryo:  Visible Cardiac Activity: Visible Heart Rate: 116  bpm CRL:  5.4  mm   6 w   1 d                  [place] EDC: 03/07/2018 Subchorionic hemorrhage:  None visualized. Maternal uterus/adnexae: Ovaries are within normal limits. The left ovary measures 2.3 x 3.6 x 2.7 cm. The right ovary measures 2.6 x 1.3 x 1.5 cm. No significant free fluid. IMPRESSION: Single  viable intrauterine pregnancy as above. There are no specific abnormalities seen. Electronically Signed   By: Jasmine PangKim  Fujinaga M.D.   On: 07/13/2017 19:18   [place] Ob Transvaginal  Result Date: 07/13/2017 CLINICAL DATA:  Heavy bleeding EXAM: OBSTETRIC <14 WK US AND TRANSVAGINAL OB US TECHNIQUE: Both transabdominal and transvaginal ultrasound examinations were performed for complete evaluation of the gestation as well as the maternal uterus, adnexal regions, and pelvic cul-de-sac. Transvaginal technique was performed to assess early pregnancy. COMPARISON:  None. FINDINGS: Intrauterine gestational sac: Single intrauterine gestational sac Yolk sac:  Visible Embryo:  Visible Cardiac Activity: Visible Heart Rate: 116  bpm CRL:  5.4  mm   6 w   1 d                  [place] EDC: 03/07/2018 Subchorionic hemorrhage:  None visualized. Maternal uterus/adnexae: Ovaries are within normal limits. The left ovary measures 2.3 x 3.6 x 2.7 cm. The right ovary measures 2.6 x 1.3 x 1.5 cm. No significant free fluid. IMPRESSION: Single viable intrauterine pregnancy as above. There are no specific abnormalities seen. Electronically Signed   By: Jasmine PangKim  Fujinaga M.D.   On: 07/13/2017 19:18   Review of Systems  Constitutional: Negative for fever.  Gastrointestinal: Negative for abdominal pain, nausea and vomiting.  Genitourinary: Negative for vaginal bleeding (None now).   Physical Exam   Blood pressure 128/61, pulse 76, temperature 99.2 F (37.3 C), temperature source Oral, resp. rate 16, height 5\' 4"  (1.626 m), weight 61.7 kg (136 lb), last menstrual period 06/24/2017.  Physical Exam  Constitutional: She is oriented to person, place, and time. She appears well-developed and well-nourished. No distress.  HENT:  Head: Normocephalic.  Eyes: Pupils are equal, round, and reactive to light.  Respiratory: Effort normal.  GI: Soft. She exhibits no distension. There is no tenderness. There is no rebound and no guarding.  Genitourinary:   Genitourinary Comments: Wet prep and GC collected with speculum  No blood noted on swabs   Musculoskeletal: Normal range of motion.  Neurological: She is alert and oriented to person, place, and time.  Skin: Skin is warm. She is not diaphoretic.  Psychiatric: Her behavior is normal.   MAU Course  Procedures  None  MDM  Wet prep & GC HIV, CBC, Hcg, ABO US OB transvaginal  Orthostatic vitals WNL  O positive blood type   Assessment and Plan   A:  1. Dizzinesses   2. Abdominal pain in pregnancy, antepartum     P:  Discharge home with strict return precautions  Increase oral fluid intake  Small, frequent meals  If dizziness returns return to MAU  Rasch, Harolyn RutherfordJennifer I, NP 07/13/2017 8:01 PM

## 2017-07-14 LAB — HIV ANTIBODY (ROUTINE TESTING W REFLEX): HIV Screen 4th Generation wRfx: NONREACTIVE

## 2017-07-14 LAB — GC/CHLAMYDIA PROBE AMP (~~LOC~~) NOT AT ARMC
Chlamydia: NEGATIVE
Neisseria Gonorrhea: NEGATIVE

## 2017-08-30 ENCOUNTER — Encounter (HOSPITAL_COMMUNITY): Payer: Self-pay

## 2017-08-30 ENCOUNTER — Inpatient Hospital Stay (HOSPITAL_COMMUNITY)
Admission: AD | Admit: 2017-08-30 | Discharge: 2017-08-30 | Disposition: A | Discharge status: Home / Self Care | Payer: Medicaid Other | Source: Ambulatory Visit | Attending: Obstetrics and Gynecology | Admitting: Obstetrics and Gynecology

## 2017-08-30 ENCOUNTER — Other Ambulatory Visit: Payer: Self-pay

## 2017-08-30 DIAGNOSIS — Z3A13 13 weeks gestation of pregnancy: Secondary | ICD-10-CM | POA: Diagnosis not present

## 2017-08-30 DIAGNOSIS — O99611 Diseases of the digestive system complicating pregnancy, first trimester: Secondary | ICD-10-CM | POA: Insufficient documentation

## 2017-08-30 DIAGNOSIS — Z880 Allergy status to penicillin: Secondary | ICD-10-CM | POA: Diagnosis not present

## 2017-08-30 DIAGNOSIS — K529 Noninfective gastroenteritis and colitis, unspecified: Secondary | ICD-10-CM

## 2017-08-30 DIAGNOSIS — O210 Mild hyperemesis gravidarum: Secondary | ICD-10-CM | POA: Diagnosis present

## 2017-08-30 LAB — URINALYSIS, ROUTINE W REFLEX MICROSCOPIC
Bacteria, UA: NONE SEEN
Bilirubin Urine: NEGATIVE
GLUCOSE, UA: NEGATIVE mg/dL
Hgb urine dipstick: NEGATIVE
KETONES UR: 80 mg/dL — AB
Leukocytes, UA: NEGATIVE
Nitrite: NEGATIVE
PH: 5 (ref 5.0–8.0)
Protein, ur: 100 mg/dL — AB
Specific Gravity, Urine: 1.031 — ABNORMAL HIGH (ref 1.005–1.030)

## 2017-08-30 MED ORDER — DEXTROSE 5 % IN LACTATED RINGERS IV BOLUS
1000.0000 mL | Freq: Once | INTRAVENOUS | Status: AC
  Administered 2017-08-30: 1000 mL via INTRAVENOUS

## 2017-08-30 MED ORDER — PROMETHAZINE HCL 12.5 MG PO TABS: 12.5000 mg | ORAL_TABLET | Freq: Four times a day (QID) | ORAL | 0 refills | Status: DC | PRN

## 2017-08-30 MED ORDER — M.V.I. ADULT IV INJ
Freq: Once | INTRAVENOUS | Status: AC
  Administered 2017-08-30: 18:00:00 via INTRAVENOUS
  Filled 2017-08-30: qty 1000

## 2017-08-30 MED ORDER — FAMOTIDINE IN NACL 20-0.9 MG/50ML-% IV SOLN
20.0000 mg | Freq: Once | INTRAVENOUS | Status: AC
Start: 2017-08-30 — End: 2017-08-30
  Administered 2017-08-30: 20 mg via INTRAVENOUS
  Filled 2017-08-30: qty 50

## 2017-08-30 MED ORDER — PROMETHAZINE HCL 25 MG/ML IJ SOLN
25.0000 mg | Freq: Once | INTRAMUSCULAR | Status: AC
  Administered 2017-08-30: 25 mg via INTRAVENOUS
  Filled 2017-08-30: qty 1

## 2017-08-30 NOTE — Progress Notes (Signed)
Pt denies n/v at this time & states that she feels much better.  D/C instructions reviewed. Pt denies ques/concerns & d/c home with sig other.

## 2017-08-30 NOTE — MAU Note (Signed)
Pt states she has been vomiting, h/a, diarrhea x 2days & only been able to keep liquids down.  Had fever last night.

## 2017-08-30 NOTE — MAU Provider Note (Signed)
History     CSN: [id number]  Arrival date and time: 08/30/17 1449   First Provider Initiated Contact with Patient 08/30/17 1700      Chief Complaint  Patient presents with  . Fever  . Dizziness  . Abdominal Pain   HPI   Tammie Martinez is a 26 y.o. female [name] @ 441w0d here in MAU with N, V, D. Symptoms started yesterday. States she has had 4 episodes of watery diarrhea, and 4 episodes of vomiting in the last 24 hours. States she feels very dehydrated. No sick contractions. No bleeding. Woke up in the middle of the night sweating.   OB History    Gravida Para Term Preterm AB Living   4 1 1   2 1    SAB TAB Ectopic Multiple Live Births   2       1      Past Medical History:  Diagnosis Date  . Medical history non-contributory     Past Surgical History:  Procedure Laterality Date  . NO PAST SURGERIES      Family History  Problem Relation Age of Onset  . Heart disease Father   . Hypertension Maternal Grandfather   . COPD Maternal Grandfather     Social History   Tobacco Use  . Smoking status: Never Smoker  . Smokeless tobacco: Never Used  Substance Use Topics  . Alcohol use: Yes    Alcohol/week: 4.2 oz    Types: 7 Glasses of wine per week    Comment: drinks on weekends  . Drug use: No    Allergies:  Allergies  Allergen Reactions  . Penicillins Hives    Has patient had a PCN reaction causing immediate rash, facial/tongue/throat swelling, SOB or lightheadedness with hypotension: Yes Has patient had a PCN reaction causing severe rash involving mucus membranes or skin necrosis: Yes Has patient had a PCN reaction that required hospitalization: No Has patient had a PCN reaction occurring within the last 10 years: No If all of the above answers are "NO", then may proceed with Cephalosporin use.   . Sulfa Antibiotics Hives    Medications Prior to Admission  Medication Sig Dispense Refill Last Dose  . acetaminophen (TYLENOL) 500 MG tablet Take 500 mg by  mouth every 6 (six) hours as needed for mild pain or headache.   08/29/2017 at Unknown time   Results for orders placed or performed during the hospital encounter of 08/30/17 (from the past 48 hour(s))  Urinalysis, Routine w reflex microscopic     Status: Abnormal   Collection Time: 08/30/17  3:02 PM  Result Value Ref Range   Color, Urine AMBER (A) YELLOW    Comment: BIOCHEMICALS MAY BE AFFECTED BY COLOR   APPearance CLOUDY (A) CLEAR   Specific Gravity, Urine 1.031 (H) 1.005 - 1.030   pH 5.0 5.0 - 8.0   Glucose, UA NEGATIVE NEGATIVE mg/dL   Hgb urine dipstick NEGATIVE NEGATIVE   Bilirubin Urine NEGATIVE NEGATIVE   Ketones, ur 80 (A) NEGATIVE mg/dL   Protein, ur 564100 (A) NEGATIVE mg/dL   Nitrite NEGATIVE NEGATIVE   Leukocytes, UA NEGATIVE NEGATIVE   RBC / HPF 0-5 0 - 5 RBC/hpf   WBC, UA 0-5 0 - 5 WBC/hpf   Bacteria, UA NONE SEEN NONE SEEN   Squamous Epithelial / LPF 6-30 (A) NONE SEEN   Mucus PRESENT    Review of Systems  Constitutional: Negative for fever.  Gastrointestinal: Positive for nausea and vomiting. Negative for abdominal pain.  Physical Exam   Blood pressure (!) 111/56, pulse 81, temperature 99.4 F (37.4 C), temperature source Oral, resp. rate 16, last menstrual period 06/24/2017, SpO2 98 %.  Physical Exam  Constitutional: She is oriented to person, place, and time. She appears well-developed and well-nourished.  Non-toxic appearance. She has a sickly appearance. She appears ill. No distress.  HENT:  Head: Normocephalic.  Eyes: Pupils are equal, round, and reactive to light.  Neck: Neck supple.  Respiratory: Effort normal.  GI: Soft. She exhibits no distension. There is no tenderness. There is no rebound and no guarding.  Musculoskeletal: Normal range of motion.  Neurological: She is alert and oriented to person, place, and time.  Skin: Skin is warm. She is not diaphoretic.  Psychiatric: Her behavior is normal.   MAU Course  Procedures  None  MDM  UA  shows significant dehydration MVI X 1 bolus  D5LR bolus X 1 Phenergan 25 mg IV X 1 Pepcid 20 mg IV X1 Patient feeling much better. Tolerating oral fluids in MAU + fetal heart tones via doppler   Assessment and Plan   A:  1. Gastroenteritis     P:  Discharge home in stable condition Rx: Phenergan Oral fluids encouraged BRAT diet X 24 hours Return to MAU if symptoms worsen  Rasch, Harolyn RutherfordJennifer I, NP 08/31/2017 10:17 AM

## 2017-08-30 NOTE — Discharge Instructions (Signed)
Food Choices to Help Relieve Diarrhea, Adult When you have diarrhea, the foods you eat and your eating habits are very important. Choosing the right foods and drinks can help:  Relieve diarrhea.  Replace lost fluids and nutrients.  Prevent dehydration.  What general guidelines should I follow? Relieving diarrhea  Choose foods with less than 2 g or .07 oz. of fiber per serving.  Limit fats to less than 8 tsp (38 g or 1.34 oz.) a day.  Avoid the following: ? Foods and beverages sweetened with high-fructose corn syrup, honey, or sugar alcohols such as xylitol, sorbitol, and mannitol. ? Foods that contain a lot of fat or sugar. ? Fried, greasy, or spicy foods. ? High-fiber grains, breads, and cereals. ? Raw fruits and vegetables.  Eat foods that are rich in probiotics. These foods include dairy products such as yogurt and fermented milk products. They help increase healthy bacteria in the stomach and intestines (gastrointestinal tract, or GI tract).  If you have lactose intolerance, avoid dairy products. These may make your diarrhea worse.  Take medicine to help stop diarrhea (antidiarrheal medicine) only as told by your health care provider. Replacing nutrients  Eat small meals or snacks every 3-4 hours.  Eat bland foods, such as white rice, toast, or baked potato, until your diarrhea starts to get better. Gradually reintroduce nutrient-rich foods as tolerated or as told by your health care provider. This includes: ? Well-cooked protein foods. ? Peeled, seeded, and soft-cooked fruits and vegetables. ? Low-fat dairy products.  Take vitamin and mineral supplements as told by your health care provider. Preventing dehydration   Start by sipping water or a special solution to prevent dehydration (oral rehydration solution, ORS). Urine that is clear or pale yellow means that you are getting enough fluid.  Try to drink at least 8-10 cups of fluid each day to help replace lost  fluids.  You may add other liquids in addition to water, such as clear juice or decaffeinated sports drinks, as tolerated or as told by your health care provider.  Avoid drinks with caffeine, such as coffee, tea, or soft drinks.  Avoid alcohol. What foods are recommended? The items listed may not be a complete list. Talk with your health care provider about what dietary choices are best for you. Grains White rice. White, [place], or pita breads (fresh or toasted), including plain rolls, buns, or bagels. White pasta. Saltine, soda, or graham crackers. Pretzels. Low-fiber cereal. Cooked cereals made with water (such as cornmeal, farina, or cream cereals). Plain muffins. Matzo. Melba toast. Zwieback. Vegetables Potatoes (without the skin). Most well-cooked and canned vegetables without skins or seeds. Tender lettuce. Fruits Apple sauce. Fruits canned in juice. Cooked apricots, cherries, grapefruit, peaches, pears, or plums. Fresh bananas and cantaloupe. Meats and other protein foods Baked or boiled chicken. Eggs. Tofu. Fish. Seafood. Smooth nut butters. Ground or well-cooked tender beef, ham, veal, lamb, pork, or poultry. Dairy Plain yogurt, kefir, and unsweetened liquid yogurt. Lactose-free milk, buttermilk, skim milk, or soy milk. Low-fat or nonfat hard cheese. Beverages Water. Low-calorie sports drinks. Fruit juices without pulp. Strained tomato and vegetable juices. Decaffeinated teas. Sugar-free beverages not sweetened with sugar alcohols. Oral rehydration solutions, if approved by your health care provider. Seasoning and other foods Bouillon, broth, or soups made from recommended foods. What foods are not recommended? The items listed may not be a complete list. Talk with your health care provider about what dietary choices are best for you. Grains Whole grain, whole wheat,  bran, or rye breads, rolls, pastas, and crackers. Wild or brown rice. Whole grain or bran cereals. Barley. Oats and  oatmeal. Corn tortillas or taco shells. Granola. Popcorn. °Vegetables °Raw vegetables. Fried vegetables. Cabbage, broccoli, Brussels sprouts, artichokes, baked beans, beet greens, corn, kale, legumes, peas, sweet potatoes, and yams. Potato skins. Cooked spinach and cabbage. °Fruits °Dried fruit, including raisins and dates. Raw fruits. Stewed or dried prunes. Canned fruits with syrup. °Meat and other protein foods °Fried or fatty meats. Deli meats. Chunky nut butters. Nuts and seeds. Beans and lentils. Bacon. Hot dogs. Sausage. °Dairy °High-fat cheeses. Whole milk, chocolate milk, and beverages made with milk, such as milk shakes. Half-and-half. Cream. sour cream. Ice cream. °Beverages °Caffeinated beverages (such as coffee, tea, soda, or energy drinks). Alcoholic beverages. Fruit juices with pulp. Prune juice. Soft drinks sweetened with high-fructose corn syrup or sugar alcohols. High-calorie sports drinks. °Fats and oils °Butter. Cream sauces. Margarine. Salad oils. Plain salad dressings. Olives. Avocados. Mayonnaise. °Sweets and desserts °Sweet rolls, doughnuts, and sweet breads. Sugar-free desserts sweetened with sugar alcohols such as xylitol and sorbitol. °Seasoning and other foods °Honey. Hot sauce. Chili powder. Gravy. Cream-based or milk-based soups. Pancakes and waffles. °Summary °· When you have diarrhea, the foods you eat and your eating habits are very important. °· Make sure you get at least 8-10 cups of fluid each day, or enough to keep your urine clear or pale yellow. °· Eat bland foods and gradually reintroduce healthy, nutrient-rich foods as tolerated, or as told by your health care provider. °· Avoid high-fiber, fried, greasy, or spicy foods. °This information is not intended to replace advice given to you by your health care provider. Make sure you discuss any questions you have with your health care provider. °Document Released: 11/26/2003 Document Revised: 09/02/2016 Document Reviewed:  09/02/2016 °Elsevier Interactive Patient Education © 2017 [employer] ° ° °Viral Gastroenteritis, Adult °Viral gastroenteritis is also known as the stomach flu. This condition is caused by certain germs (viruses). These germs can be passed from person to person very easily (are very contagious). This condition can cause sudden watery poop (diarrhea), fever, and throwing up (vomiting). °Having watery poop and throwing up can make you feel weak and cause you to get dehydrated. Dehydration can make you tired and thirsty, make you have a dry mouth, and make it so you pee (urinate) less often. Older adults and people with other diseases or a weak defense system (immune system) are at higher risk for dehydration. It is important to replace the fluids that you lose from having watery poop and throwing up. °Follow these instructions at home: °Follow instructions from your doctor about how to care for yourself at home. °Eating and drinking ° °Follow these instructions as told by your doctor: °· Take an oral rehydration solution (ORS). This is a drink that is sold at pharmacies and stores. °· Drink clear fluids in small amounts as you are able, such as: °? Water. °? Ice chips. °? Diluted fruit juice. °? Low-calorie sports drinks. °· Eat bland, easy-to-digest foods in small amounts as you are able, such as: °? Bananas. °? Applesauce. °? Rice. °? Low-fat (lean) meats. °? Toast. °? Crackers. °· Avoid fluids that have a lot of sugar or caffeine in them. °· Avoid alcohol. °· Avoid spicy or fatty foods. ° °General instructions °· Drink enough fluid to keep your pee (urine) clear or pale yellow. °· Wash your hands often. If you cannot use soap and water, use hand sanitizer. °·   sure that all people in your home wash their hands well and often. °· Rest at home while you get better. °· Take over-the-counter and prescription medicines only as told by your doctor. °· Watch your condition for any changes. °· Take a warm bath to help  with any burning or pain from having watery poop. °· Keep all follow-up visits as told by your doctor. This is important. °Contact a doctor if: °· You cannot keep fluids down. °· Your symptoms get worse. °· You have new symptoms. °· You feel light-headed or dizzy. °· You have muscle cramps. °Get help right away if: °· You have chest pain. °· You feel very weak or you pass out (faint). °· You see blood in your throw-up. °· Your throw-up looks like coffee grounds. °· You have bloody or black poop (stools) or poop that look like tar. °· You have a very bad headache, a stiff neck, or both. °· You have a rash. °· You have very bad pain, cramping, or bloating in your belly (abdomen). °· You have trouble breathing. °· You are breathing very quickly. °· Your heart is beating very quickly. °· Your skin feels cold and clammy. °· You feel confused. °· You have pain when you pee. °· You have signs of dehydration, such as: °? Dark pee, hardly any pee, or no pee. °? Cracked lips. °? Dry mouth. °? Sunken eyes. °? Sleepiness. °? Weakness. °This information is not intended to replace advice given to you by your health care provider. Make sure you discuss any questions you have with your health care provider. °Document Released: 02/22/2008 Document Revised: 03/25/2016 Document Reviewed: 05/12/2015 °Elsevier Interactive Patient Education © 2017 [employer] Inc[employer] ° °

## 2017-08-30 NOTE — MAU Note (Signed)
Pt started feeling bad the day before yesterday.  Lower abdominal cramping and leg pain.  Woke up last night sweating but felt cold.  N/V 3 episodes of vomiting in the last 24 hours.  Denies VB.

## 2017-09-07 DIAGNOSIS — Z3490 Encounter for supervision of normal pregnancy, unspecified, unspecified trimester: Secondary | ICD-10-CM | POA: Insufficient documentation

## 2017-09-08 ENCOUNTER — Ambulatory Visit (INDEPENDENT_AMBULATORY_CARE_PROVIDER_SITE_OTHER): Payer: Medicaid Other | Admitting: Certified Nurse Midwife

## 2017-09-08 ENCOUNTER — Encounter: Payer: Self-pay | Admitting: Certified Nurse Midwife

## 2017-09-08 ENCOUNTER — Other Ambulatory Visit (HOSPITAL_COMMUNITY)
Admission: RE | Admit: 2017-09-08 | Discharge: 2017-09-08 | Disposition: A | Discharge status: Home / Self Care | Payer: Medicaid Other | Source: Ambulatory Visit | Attending: Certified Nurse Midwife | Admitting: Certified Nurse Midwife

## 2017-09-08 VITALS — BP 102/65 | HR 90 | Wt 140.0 lb

## 2017-09-08 DIAGNOSIS — Z3482 Encounter for supervision of other normal pregnancy, second trimester: Secondary | ICD-10-CM

## 2017-09-08 DIAGNOSIS — Z3490 Encounter for supervision of normal pregnancy, unspecified, unspecified trimester: Secondary | ICD-10-CM | POA: Insufficient documentation

## 2017-09-08 DIAGNOSIS — O219 Vomiting of pregnancy, unspecified: Secondary | ICD-10-CM

## 2017-09-08 MED ORDER — ONDANSETRON HCL 8 MG PO TABS: 8.0000 mg | ORAL_TABLET | Freq: Three times a day (TID) | ORAL | 2 refills | Status: DC | PRN

## 2017-09-08 MED ORDER — DOXYLAMINE-PYRIDOXINE 10-10 MG PO TBEC: DELAYED_RELEASE_TABLET | ORAL | 4 refills | Status: DC

## 2017-09-08 MED ORDER — VITAFOL-NANO 18-0.6-0.4 MG PO TABS: 1.0000 | ORAL_TABLET | Freq: Every day | ORAL | 12 refills | Status: DC

## 2017-09-08 NOTE — Progress Notes (Signed)
Subjective:   Tammie Martinez is a 26 y.o. [name] at 3163w2d by LMP, early ultrasound being seen today for her first obstetrical visit.  Her obstetrical history is significant for recent SAB, subchorionic hemorrhage. Patient does intend to breast feed. Pregnancy history fully reviewed.  Patient reports nausea and vomiting.  HISTORY: Obstetric History   G4   P1   T1   P0   A2   L1    SAB2   TAB0   Ectopic0   Multiple0   Live Births1     # Outcome Date GA Lbr Len/2nd Weight Sex Delivery Anes PTL Lv  4 Current           3 SAB 02/2017 3382w0d         2 Term 03/17/10 3782w0d   M Vag-Spont   LIV  1 SAB 2009 1367w0d       DEC     Past Medical History:  Diagnosis Date  . Medical history non-contributory    Past Surgical History:  Procedure Laterality Date  . NO PAST SURGERIES     Family History  Problem Relation Age of Onset  . Heart disease Father   . Hypertension Maternal Grandfather   . COPD Maternal Grandfather    Social History   Tobacco Use  . Smoking status: Never Smoker  . Smokeless tobacco: Never Used  Substance Use Topics  . Alcohol use: No    Alcohol/week: 4.2 oz    Types: 7 Glasses of wine per week    Frequency: Never    Comment: drinks on weekends  . Drug use: No    Comment: prior to pregnancy   Allergies  Allergen Reactions  . Penicillins Hives    Has patient had a PCN reaction causing immediate rash, facial/tongue/throat swelling, SOB or lightheadedness with hypotension: Yes Has patient had a PCN reaction causing severe rash involving mucus membranes or skin necrosis: Yes Has patient had a PCN reaction that required hospitalization: No Has patient had a PCN reaction occurring within the last 10 years: No If all of the above answers are "NO", then may proceed with Cephalosporin use.   . Sulfa Antibiotics Hives   Current Outpatient Medications on File Prior to Visit  Medication Sig Dispense Refill  . acetaminophen (TYLENOL) 500 MG tablet Take 500 mg by mouth  every 6 (six) hours as needed for mild pain or headache.    . promethazine (PHENERGAN) 12.5 MG tablet Take 1 tablet (12.5 mg total) by mouth every 6 (six) hours as needed for nausea or vomiting. (Patient not taking: Reported on 09/08/2017) 30 tablet 0   No current facility-administered medications on file prior to visit.      Exam   There were no vitals filed for this visit.    Uterus:     Pelvic Exam: Perineum: no hemorrhoids, normal perineum   Vulva: normal external genitalia, no lesions   Vagina:  normal mucosa, normal discharge   Cervix: no lesions and normal, pap smear done.    Adnexa: normal adnexa and no mass, fullness, tenderness   Bony Pelvis: average  System: General: well-developed, well-nourished female in no acute distress   Breast:  normal appearance, no masses or tenderness   Skin: normal coloration and turgor, no rashes   Neurologic: oriented, normal, negative, normal mood   Extremities: normal strength, tone, and muscle mass, ROM of all joints is normal   HEENT PERRLA, extraocular movement intact and sclera clear, anicteric   Mouth/Teeth  mucous membranes moist, pharynx normal without lesions and dental hygiene good   Neck supple and no masses   Cardiovascular: regular rate and rhythm   Respiratory:  no respiratory distress, normal breath sounds   Abdomen: soft, non-tender; bowel sounds normal; no masses,  no organomegaly     Assessment:   Pregnancy: [name] Patient Active Problem List   Diagnosis Date Noted  . Supervision of normal pregnancy, antepartum 09/07/2017  . Missed abortion 02/17/2017     Plan:  1. Encounter for supervision of normal pregnancy, antepartum, unspecified gravidity    - Obstetric Panel, Including HIV - Hemoglobinopathy evaluation - Cystic Fibrosis Mutation 97 - Culture, OB Urine - Cytology - PAP - Flu Vaccine QUAD 36+ mos IM (Fluarix, Quad PF) - Enroll Patient in Babyscripts - Hemoglobin A1c - US MFM OB COMP + 14 WK;  Future   2. Nausea/vomiting in pregnancy     - Doxylamine-Pyridoxine (DICLEGIS) 10-10 MG TBEC; Take 1 tablet with breakfast and lunch.  Take 2 tablets at bedtime.  Dispense: 100 tablet; Refill: 4 - ondansetron (ZOFRAN) 8 MG tablet; Take 1 tablet (8 mg total) by mouth every 8 (eight) hours as needed for nausea or vomiting.  Dispense: 40 tablet; Refill: 2  Initial labs drawn. Continue prenatal vitamins. Genetic Screening discussed, NIPS: declined. Ultrasound discussed; fetal anatomic survey: ordered. Problem list reviewed and updated. The nature of [facility] with multiple MDs and other Advanced Practice Providers was explained to patient; also emphasized that residents, students are part of our team. Routine obstetric precautions reviewed. Return in about 4 weeks (around 10/06/2017) for ROB.     Tammie Martinez, CNM [facility], [facility]

## 2017-09-08 NOTE — Addendum Note (Signed)
Addended by: Orvilla CornwallENNEY, Alazae Crymes A on: 09/08/2017 10:40 AM   Modules accepted: Orders

## 2017-09-08 NOTE — Progress Notes (Signed)
Patient is in the office for initial ob visit, reports feeling fetal flutter movements. Pt is in a relationship with FOB, unplanned pregnancy.

## 2017-09-10 LAB — CULTURE, OB URINE

## 2017-09-10 LAB — URINE CULTURE, OB REFLEX: Organism ID, Bacteria: NO GROWTH

## 2017-09-11 LAB — OBSTETRIC PANEL, INCLUDING HIV
ANTIBODY SCREEN: NEGATIVE
BASOS: 0 %
Basophils Absolute: 0 10*3/uL (ref 0.0–0.2)
EOS (ABSOLUTE): 0.2 10*3/uL (ref 0.0–0.4)
EOS: 2 %
HEMATOCRIT: 32.4 % — AB (ref 34.0–46.6)
HEMOGLOBIN: 10.6 g/dL — AB (ref 11.1–15.9)
HIV Screen 4th Generation wRfx: NONREACTIVE
Hepatitis B Surface Ag: NEGATIVE
IMMATURE GRANS (ABS): 0.3 10*3/uL — AB (ref 0.0–0.1)
Immature Granulocytes: 3 %
LYMPHS: 40 %
Lymphocytes Absolute: 4.3 10*3/uL — ABNORMAL HIGH (ref 0.7–3.1)
MCH: 27.4 pg (ref 26.6–33.0)
MCHC: 32.7 g/dL (ref 31.5–35.7)
MCV: 84 fL (ref 79–97)
MONOS ABS: 1.2 10*3/uL — AB (ref 0.1–0.9)
Monocytes: 11 %
NEUTROS ABS: 5.1 10*3/uL (ref 1.4–7.0)
Neutrophils: 44 %
Platelets: 348 10*3/uL (ref 150–379)
RBC: 3.87 x10E6/uL (ref 3.77–5.28)
RDW: 13 % (ref 12.3–15.4)
RH TYPE: POSITIVE
RPR Ser Ql: NONREACTIVE
Rubella Antibodies, IGG: <0.9 index — ABNORMAL LOW (ref >0.99)
WBC: 10.9 10*3/uL — ABNORMAL HIGH (ref 3.4–10.8)

## 2017-09-11 LAB — HEMOGLOBINOPATHY EVALUATION
HEMOGLOBIN A2 QUANTITATION: 2.6 % (ref 1.8–3.2)
HGB C: 0 %
HGB S: 0 %
HGB VARIANT: 0 %
Hemoglobin F Quantitation: 0 % (ref 0.0–2.0)
Hgb A: 97.4 % (ref 96.4–98.8)

## 2017-09-11 LAB — VITAMIN D 25 HYDROXY (VIT D DEFICIENCY, FRACTURES): VIT D 25 HYDROXY: 12.3 ng/mL — AB (ref 30.0–100.0)

## 2017-09-11 LAB — HEMOGLOBIN A1C
Est. average glucose Bld gHb Est-mCnc: 91 mg/dL
Hgb A1c MFr Bld: 4.8 % (ref 4.8–5.6)

## 2017-09-13 LAB — CYTOLOGY - PAP: Diagnosis: NEGATIVE

## 2017-09-13 NOTE — Addendum Note (Signed)
Addended by: Maretta BeesMCGLASHAN, Darren Nodal J on: 09/13/2017 11:30 AM   Modules accepted: Orders

## 2017-09-14 LAB — CERVICOVAGINAL ANCILLARY ONLY
BACTERIAL VAGINITIS: NEGATIVE
CHLAMYDIA, DNA PROBE: NEGATIVE
Candida vaginitis: POSITIVE — AB
NEISSERIA GONORRHEA: NEGATIVE
Trichomonas: NEGATIVE

## 2017-09-15 LAB — CYSTIC FIBROSIS MUTATION 97: GENE DIS ANAL CARRIER INTERP BLD/T-IMP: NOT DETECTED

## 2017-09-19 NOTE — L&D Delivery Note (Signed)
Patient is 27 y.o. [name] 2652w6d admitted SROM. S/P Pitocin  Delivery Note At 7:02 PM a viable female was delivered via Vaginal, Spontaneous (Presentation: ROA;  ).  APGAR: 8, 9; weight  pending Placenta status: delivered intact Cord: 3V. Delayed cord cutting by 30 min per parent request.   Anesthesia:  Epidural Episiotomy: None Lacerations:  None Est. Blood Loss (mL):  50mL   Mom to postpartum.  Baby to Couplet care / Skin to Skin.  Upon arrival patient was complete and pushing. She pushed with good maternal effort to deliver a healthy baby body. Baby delivered without difficulty, was noted to have good tone and place on maternal abdomen for oral suctioning, drying and stimulation. Delayed cord clamping performed for 30 min per patient request. Placenta delivered intact with 3V cord. Cord labs not obtained due to prolonged delayed clamping. Vaginal canal and perineum was inspected and hemostatic. Pitocin was started and uterus massaged until bleeding slowed. Counts of sharps, instruments, and lap pads were all correct.   Garnette GunnerAaron B Alyssamae Klinck, MD PGY-1 6/11/20197:22 PM

## 2017-09-25 ENCOUNTER — Other Ambulatory Visit: Payer: Self-pay | Admitting: Certified Nurse Midwife

## 2017-09-25 DIAGNOSIS — Z2839 Other underimmunization status: Secondary | ICD-10-CM | POA: Insufficient documentation

## 2017-09-25 DIAGNOSIS — B373 Candidiasis of vulva and vagina: Secondary | ICD-10-CM

## 2017-09-25 DIAGNOSIS — B3731 Acute candidiasis of vulva and vagina: Secondary | ICD-10-CM

## 2017-09-25 DIAGNOSIS — Z3480 Encounter for supervision of other normal pregnancy, unspecified trimester: Secondary | ICD-10-CM

## 2017-09-25 DIAGNOSIS — Z283 Underimmunization status: Secondary | ICD-10-CM

## 2017-09-25 DIAGNOSIS — E559 Vitamin D deficiency, unspecified: Secondary | ICD-10-CM

## 2017-09-25 DIAGNOSIS — O9989 Other specified diseases and conditions complicating pregnancy, childbirth and the puerperium: Secondary | ICD-10-CM

## 2017-09-25 DIAGNOSIS — O09899 Supervision of other high risk pregnancies, unspecified trimester: Secondary | ICD-10-CM

## 2017-09-25 MED ORDER — TERCONAZOLE 0.8 % VA CREA: 1.0000 | TOPICAL_CREAM | Freq: Every day | VAGINAL | 0 refills | Status: DC

## 2017-09-25 MED ORDER — VITAMIN D (ERGOCALCIFEROL) 1.25 MG (50000 UNIT) PO CAPS: 50000.0000 [IU] | ORAL_CAPSULE | ORAL | 2 refills | Status: DC

## 2017-09-25 MED ORDER — FLUCONAZOLE 150 MG PO TABS: 150.0000 mg | ORAL_TABLET | Freq: Once | ORAL | 0 refills | Status: AC

## 2017-10-03 ENCOUNTER — Encounter (HOSPITAL_COMMUNITY): Payer: Self-pay | Admitting: Certified Nurse Midwife

## 2017-10-06 ENCOUNTER — Ambulatory Visit (INDEPENDENT_AMBULATORY_CARE_PROVIDER_SITE_OTHER): Payer: Medicaid Other | Admitting: Certified Nurse Midwife

## 2017-10-06 VITALS — BP 118/66 | HR 78 | Wt 141.0 lb

## 2017-10-06 DIAGNOSIS — O9989 Other specified diseases and conditions complicating pregnancy, childbirth and the puerperium: Secondary | ICD-10-CM

## 2017-10-06 DIAGNOSIS — Z2839 Other underimmunization status: Secondary | ICD-10-CM

## 2017-10-06 DIAGNOSIS — E559 Vitamin D deficiency, unspecified: Secondary | ICD-10-CM

## 2017-10-06 DIAGNOSIS — Z3480 Encounter for supervision of other normal pregnancy, unspecified trimester: Secondary | ICD-10-CM

## 2017-10-06 DIAGNOSIS — Z283 Underimmunization status: Secondary | ICD-10-CM

## 2017-10-06 DIAGNOSIS — Z3482 Encounter for supervision of other normal pregnancy, second trimester: Secondary | ICD-10-CM

## 2017-10-06 NOTE — Progress Notes (Signed)
   PRENATAL VISIT NOTE  Subjective:  Tammie Martinez is a 27 y.o. A6W2574 at 34w2dbeing seen today for ongoing prenatal care.  She is currently monitored for the following issues for this low-risk pregnancy and has Missed abortion; Supervision of normal pregnancy, antepartum; Rubella non-immune status, antepartum; and Vitamin D deficiency on their problem list.  Patient reports no bleeding, no contractions, no cramping, no leaking and states chest pain last night; nothing currently.  Encouraged to [facility] if anything changes/occurs again.  Brother had 2 heart bypasses.  Contractions: Not present. Vag. Bleeding: None.  Movement: Present. Denies leaking of fluid.   The following portions of the patient's history were reviewed and updated as appropriate: allergies, current medications, past family history, past medical history, past social history, past surgical history and problem list. Problem list updated.  Objective:   Vitals:   10/06/17 1053  BP: 118/66  Pulse: 78  Weight: 141 lb (64 kg)    Fetal Status: Fetal Heart Rate (bpm): 135-145; doppler Fundal Height: 18 cm Movement: Present     General:  Alert, oriented and cooperative. Patient is in no acute distress.  Skin: Skin is warm and dry. No rash noted.   Cardiovascular: Normal heart rate noted  Respiratory: Normal respiratory effort, no problems with respiration noted  Abdomen: Soft, gravid, appropriate for gestational age.  Pain/Pressure: Absent     Pelvic: Cervical exam deferred        Extremities: Normal range of motion.  Edema: None  Mental Status:  Normal mood and affect. Normal behavior. Normal judgment and thought content.   Assessment and Plan:  Pregnancy: [name] at 165w2d1. Supervision of other normal pregnancy, antepartum     Doing well. Stopped smoking.  Anatomy USKoreacheduled 10/10/17.  - AFP, Serum, Open Spina Bifida - Genetic Screening  2. Rubella non-immune status, antepartum     MMR pospartum  3.  Vitamin D deficiency     Taking weekly vitamin D.   Preterm labor symptoms and general obstetric precautions including but not limited to vaginal bleeding, contractions, leaking of fluid and fetal movement were reviewed in detail with the patient. Please refer to After Visit Summary for other counseling recommendations.  Return in about 4 weeks (around 11/03/2017) for ROB.   RaMorene CrockerCNM

## 2017-10-10 ENCOUNTER — Ambulatory Visit (HOSPITAL_COMMUNITY)
Admission: RE | Admit: 2017-10-10 | Discharge: 2017-10-10 | Disposition: A | Discharge status: Home / Self Care | Payer: Medicaid Other | Source: Ambulatory Visit | Attending: Certified Nurse Midwife | Admitting: Certified Nurse Midwife

## 2017-10-10 ENCOUNTER — Other Ambulatory Visit: Payer: Self-pay | Admitting: Certified Nurse Midwife

## 2017-10-10 DIAGNOSIS — Z3A18 18 weeks gestation of pregnancy: Secondary | ICD-10-CM

## 2017-10-10 DIAGNOSIS — Z363 Encounter for antenatal screening for malformations: Secondary | ICD-10-CM

## 2017-10-10 DIAGNOSIS — Z3490 Encounter for supervision of normal pregnancy, unspecified, unspecified trimester: Secondary | ICD-10-CM | POA: Insufficient documentation

## 2017-10-11 ENCOUNTER — Other Ambulatory Visit: Payer: Self-pay | Admitting: Certified Nurse Midwife

## 2017-10-11 DIAGNOSIS — Z3480 Encounter for supervision of other normal pregnancy, unspecified trimester: Secondary | ICD-10-CM

## 2017-10-11 LAB — AFP, SERUM, OPEN SPINA BIFIDA
AFP MoM: 0.8
AFP Value: 39.9 ng/mL
GEST. AGE ON COLLECTION DATE: 18.2 wk
Maternal Age At EDD: 28.2 yr
OSBR RISK 1 IN: 10000
Test Results:: NEGATIVE
WEIGHT: 141 [lb_av]

## 2017-10-16 ENCOUNTER — Encounter: Payer: Self-pay | Admitting: *Deleted

## 2017-11-03 ENCOUNTER — Encounter: Payer: Self-pay | Admitting: Obstetrics

## 2017-11-03 ENCOUNTER — Ambulatory Visit (INDEPENDENT_AMBULATORY_CARE_PROVIDER_SITE_OTHER): Payer: Medicaid Other | Admitting: Obstetrics

## 2017-11-03 DIAGNOSIS — Z3482 Encounter for supervision of other normal pregnancy, second trimester: Secondary | ICD-10-CM

## 2017-11-03 DIAGNOSIS — Z3480 Encounter for supervision of other normal pregnancy, unspecified trimester: Secondary | ICD-10-CM

## 2017-11-03 NOTE — Progress Notes (Signed)
Subjective:  Tammie Martinez is a 27 y.o. [name] at 6237w2d being seen today for ongoing prenatal care.  She is currently monitored for the following issues for this low-risk pregnancy and has Missed abortion; Supervision of normal pregnancy, antepartum; Rubella non-immune status, antepartum; and Vitamin D deficiency on their problem list.  Patient reports no complaints.  Contractions: Not present. Vag. Bleeding: None.  Movement: Present. Denies leaking of fluid.   The following portions of the patient's history were reviewed and updated as appropriate: allergies, current medications, past family history, past medical history, past social history, past surgical history and problem list. Problem list updated.  Objective:   Vitals:   11/03/17 1058  BP: 112/62  Pulse: 71  Weight: 150 lb 9.6 oz (68.3 kg)    Fetal Status: Fetal Heart Rate (bpm): 140   Movement: Present     General:  Alert, oriented and cooperative. Patient is in no acute distress.  Skin: Skin is warm and dry. No rash noted.   Cardiovascular: Normal heart rate noted  Respiratory: Normal respiratory effort, no problems with respiration noted  Abdomen: Soft, gravid, appropriate for gestational age. Pain/Pressure: Absent     Pelvic:  Cervical exam deferred        Extremities: Normal range of motion.  Edema: None  Mental Status: Normal mood and affect. Normal behavior. Normal judgment and thought content.   Urinalysis:      Assessment and Plan:  Pregnancy: [name] at 2537w2d  1. Supervision of other normal pregnancy, antepartum   Preterm labor symptoms and general obstetric precautions including but not limited to vaginal bleeding, contractions, leaking of fluid and fetal movement were reviewed in detail with the patient. Please refer to After Visit Summary for other counseling recommendations.  Return in about 4 weeks (around 12/01/2017) for ROB.   Brock BadHarper, Charles A, MD

## 2017-11-18 ENCOUNTER — Emergency Department (HOSPITAL_COMMUNITY)
Admission: EM | Admit: 2017-11-18 | Discharge: 2017-11-18 | Disposition: A | Discharge status: Home / Self Care | Payer: Medicaid Other | Attending: Emergency Medicine | Admitting: Emergency Medicine

## 2017-11-18 ENCOUNTER — Encounter (HOSPITAL_COMMUNITY): Payer: Self-pay | Admitting: Emergency Medicine

## 2017-11-18 DIAGNOSIS — K047 Periapical abscess without sinus: Secondary | ICD-10-CM | POA: Insufficient documentation

## 2017-11-18 DIAGNOSIS — K0889 Other specified disorders of teeth and supporting structures: Secondary | ICD-10-CM | POA: Diagnosis present

## 2017-11-18 MED ORDER — CEPHALEXIN 500 MG PO CAPS: 500.0000 mg | ORAL_CAPSULE | Freq: Four times a day (QID) | ORAL | 0 refills | Status: DC

## 2017-11-18 NOTE — ED Triage Notes (Signed)
Pt presents with R upper/lower dental pain x 2 days; went to dentist but unable to take abx d/t being [redacted] wks pregnant; also was prescribed tylenol 3s but under wrong name so unable to fill, pt reports regular prenatal care and healthy pregnancy so far G4:P1

## 2017-11-18 NOTE — ED Provider Notes (Signed)
[facility] Provider Note   CSN: [id number] Arrival date & time: 11/18/17  [id number]     History   Chief Complaint Chief Complaint  Patient presents with  . Dental Pain    HPI Tammie Martinez is a 27 y.o. female.  The history is provided by the patient and medical records.  Dental Pain      27 y.o. F here with right upper dental infection. Patient states she has been having some dental pain over the past few days.  She was seen by her dentist yesterday and was to be started on abx but because she is pregnant, they did not prescribe her anything until she talked to her OB.  She was given prescription for tylenol #3, but wrong name was on her scripts. Patient is currently [redacted] wks pregnant, has had appropriate prenatal care. No issues related to pregnancy thus far.  Past Medical History:  Diagnosis Date  . Medical history non-contributory     Patient Active Problem List   Diagnosis Date Noted  . Rubella non-immune status, antepartum 09/25/2017  . Vitamin D deficiency 09/25/2017  . Supervision of normal pregnancy, antepartum 09/07/2017  . Missed abortion 02/17/2017    Past Surgical History:  Procedure Laterality Date  . NO PAST SURGERIES      OB History    Gravida Para Term Preterm AB Living   4 1 1   2 1    SAB TAB Ectopic Multiple Live Births   2       1       Home Medications    Prior to Admission medications   Medication Sig Start Date End Date Taking? Authorizing Provider  acetaminophen (TYLENOL) 500 MG tablet Take 500 mg by mouth every 6 (six) hours as needed for mild pain or headache.    [provider]  cephALEXin (KEFLEX) 500 MG capsule Take 1 capsule (500 mg total) by mouth 4 (four) times daily. 11/18/17   Garlon HatchetSanders, Maddyx Wieck M, PA-C  Doxylamine-Pyridoxine (DICLEGIS) 10-10 MG TBEC Take 1 tablet with breakfast and lunch.  Take 2 tablets at bedtime. 09/08/17   Orvilla Cornwallenney, Rachelle A, CNM  ondansetron (ZOFRAN) 8 MG tablet Take 1  tablet (8 mg total) by mouth every 8 (eight) hours as needed for nausea or vomiting. 09/08/17   Denney, Rachelle A, CNM  Prenatal-Fe Fum-Methf-FA w/o A (VITAFOL-NANO) 18-0.6-0.4 MG TABS Take 1 tablet by mouth at bedtime. 09/08/17   Roe Coombsenney, Rachelle A, CNM  promethazine (PHENERGAN) 12.5 MG tablet Take 1 tablet (12.5 mg total) by mouth every 6 (six) hours as needed for nausea or vomiting. 08/30/17   Rasch, Harolyn RutherfordJennifer I, NP  Vitamin D, Ergocalciferol, (DRISDOL) 50000 units CAPS capsule Take 1 capsule (50,000 Units total) by mouth every 7 (seven) days. 09/25/17   Roe Coombsenney, Rachelle A, CNM    Family History Family History  Problem Relation Age of Onset  . Heart disease Father   . Hypertension Maternal Grandfather   . COPD Maternal Grandfather     Social History Social History   Tobacco Use  . Smoking status: Never Smoker  . Smokeless tobacco: Never Used  Substance Use Topics  . Alcohol use: No    Alcohol/week: 4.2 oz    Types: 7 Glasses of wine per week    Frequency: Never    Comment: drinks on weekends  . Drug use: No    Comment: prior to pregnancy     Allergies   Penicillins and Sulfa antibiotics  Review of Systems Review of Systems  HENT: Positive for dental problem.   All other systems reviewed and are negative.    Physical Exam Updated Vital Signs BP 120/63 (BP Location: Right Arm)   Pulse 80   Temp 98.7 F (37.1 C) (Oral)   Resp 14   Ht 5\' 5"  (1.651 m)   Wt 69.9 kg (154 lb)   LMP 06/24/2017   SpO2 97%   BMI 25.63 kg/m   Physical Exam  Constitutional: She is oriented to person, place, and time. She appears well-developed and well-nourished.  HENT:  Head: Normocephalic and atraumatic.  Mouth/Throat: Oropharynx is clear and moist.  Teeth largely in fair dentition, right upper pre-molars are decayed, surrounding gingiva overall normal without drainable abscess, handling secretions appropriately, no trismus, no facial or neck swelling, normal phonation without  stridor  Eyes: Conjunctivae and EOM are normal. Pupils are equal, round, and reactive to light.  Neck: Normal range of motion.  Cardiovascular: Normal rate, regular rhythm and normal heart sounds.  Pulmonary/Chest: Effort normal and breath sounds normal. No stridor. No respiratory distress.  Abdominal: Soft. Bowel sounds are normal. There is no tenderness. There is no rebound.  Musculoskeletal: Normal range of motion.  Neurological: She is alert and oriented to person, place, and time.  Skin: Skin is warm and dry.  Psychiatric: She has a normal mood and affect.  Nursing note and vitals reviewed.    ED Treatments / Results  Labs (all labs ordered are listed, but only abnormal results are displayed) Labs Reviewed - No data to display  EKG  EKG Interpretation None       Radiology No results found.  Procedures Procedures (including critical care time)  Medications Ordered in ED Medications - No data to display   Initial Impression / Assessment and Plan / ED Course  I have reviewed the triage vital signs and the nursing notes.  Pertinent labs & imaging results that were available during my care of the patient were reviewed by me and considered in my medical decision making (see chart for details).  27 y.o. F here with dental infection.  Has been seen by dentist already, was not prescribed abx due to pregnancy and unsure which ones she could take.  Does have some decayed upper molars on exam but no drainable abscess at this time.  No facial or neck swelling, no signs/symptoms concerning for ludwig's angina.  She is [redacted] wks pregnant, has had appropriate prenatal care.  No issues with pregnancy today.  Will start on keflex and have her follow-up with dentist.  Discussed plan with patient, she acknowledged understanding and agreed with plan of care.  Return precautions given for new or worsening symptoms.  Final Clinical Impressions(s) / ED Diagnoses   Final diagnoses:  Dental  infection    ED Discharge Orders        Ordered    cephALEXin (KEFLEX) 500 MG capsule  4 times daily     11/18/17 0349       Garlon Hatchet, PA-C 11/18/17 0410    Azalia Bilis, MD 11/18/17 [phone]

## 2017-11-18 NOTE — Discharge Instructions (Signed)
Follow-up with your dentist. Take antibiotics as directed.  These are safe in pregnancy.

## 2017-12-05 ENCOUNTER — Encounter: Payer: Medicaid Other | Admitting: Obstetrics

## 2017-12-05 ENCOUNTER — Encounter: Payer: Self-pay | Admitting: Obstetrics and Gynecology

## 2017-12-05 ENCOUNTER — Other Ambulatory Visit: Payer: Self-pay

## 2017-12-05 ENCOUNTER — Ambulatory Visit (INDEPENDENT_AMBULATORY_CARE_PROVIDER_SITE_OTHER): Payer: Medicaid Other | Admitting: Obstetrics and Gynecology

## 2017-12-05 VITALS — BP 112/67 | HR 80 | Wt 161.8 lb

## 2017-12-05 DIAGNOSIS — O9989 Other specified diseases and conditions complicating pregnancy, childbirth and the puerperium: Secondary | ICD-10-CM

## 2017-12-05 DIAGNOSIS — Z2839 Other underimmunization status: Secondary | ICD-10-CM

## 2017-12-05 DIAGNOSIS — Z3480 Encounter for supervision of other normal pregnancy, unspecified trimester: Secondary | ICD-10-CM

## 2017-12-05 DIAGNOSIS — Z283 Underimmunization status: Secondary | ICD-10-CM

## 2017-12-05 DIAGNOSIS — Z3482 Encounter for supervision of other normal pregnancy, second trimester: Secondary | ICD-10-CM

## 2017-12-05 NOTE — Progress Notes (Signed)
   PRENATAL VISIT NOTE  Subjective:  Tammie Martinez is a 27 y.o. [name] at 41w6dbeing seen today for ongoing prenatal care.  She is currently monitored for the following issues for this low-risk pregnancy and has Missed abortion; Supervision of normal pregnancy, antepartum; Rubella non-immune status, antepartum; and Vitamin D deficiency on their problem list.  Patient reports vaginal pressure. Reports feeling increased pressure about once per day, improves with movement.   Contractions: Not present. Vag. Bleeding: None.  Movement: Present. Denies leaking of fluid.   The following portions of the patient's history were reviewed and updated as appropriate: allergies, current medications, past family history, past medical history, past social history, past surgical history and problem list. Problem list updated.  Objective:   Vitals:   12/05/17 1409  BP: 112/67  Pulse: 80  Weight: 161 lb 12.8 oz (73.4 kg)    Fetal Status: Fetal Heart Rate (bpm): 146   Movement: Present     General:  Alert, oriented and cooperative. Patient is in no acute distress.  Skin: Skin is warm and dry. No rash noted.   Cardiovascular: Normal heart rate noted  Respiratory: Normal respiratory effort, no problems with respiration noted  Abdomen: Soft, gravid, appropriate for gestational age.  Pain/Pressure: Present     Pelvic: Cervical exam deferred        Extremities: Normal range of motion.  Edema: None  Mental Status:  Normal mood and affect. Normal behavior. Normal judgment and thought content.   Assessment and Plan:  Pregnancy: [name] at 28w6d1. Supervision of other normal pregnancy, antepartum [facility] has been taking prenatals Reviewed that pressure is likely round ligament pain, discussed strategies, reasons to present to MAU  2. Rubella non-immune status, antepartum MMR post partum  Preterm labor symptoms and general obstetric precautions including but not limited to vaginal  bleeding, contractions, leaking of fluid and fetal movement were reviewed in detail with the patient. Please refer to After Visit Summary for other counseling recommendations.  Return in about 2 weeks (around 12/19/2017) for OB visit, 3rd trim labs, 2 hr GTT.   KeSloan LeiterMD

## 2017-12-05 NOTE — Progress Notes (Signed)
C/o of pain in vagina whenever she walks/sit and pressure.  [facility]

## 2017-12-19 ENCOUNTER — Other Ambulatory Visit: Payer: Self-pay

## 2017-12-19 ENCOUNTER — Encounter: Payer: Self-pay | Admitting: Obstetrics

## 2017-12-19 ENCOUNTER — Other Ambulatory Visit: Payer: Medicaid Other

## 2017-12-19 ENCOUNTER — Ambulatory Visit (INDEPENDENT_AMBULATORY_CARE_PROVIDER_SITE_OTHER): Payer: Medicaid Other | Admitting: Obstetrics

## 2017-12-19 DIAGNOSIS — Z23 Encounter for immunization: Secondary | ICD-10-CM

## 2017-12-19 DIAGNOSIS — Z3490 Encounter for supervision of normal pregnancy, unspecified, unspecified trimester: Secondary | ICD-10-CM

## 2017-12-19 DIAGNOSIS — Z3483 Encounter for supervision of other normal pregnancy, third trimester: Secondary | ICD-10-CM

## 2017-12-19 MED ORDER — CLINDAMYCIN HCL 300 MG PO CAPS: 300.0000 mg | ORAL_CAPSULE | Freq: Three times a day (TID) | ORAL | 0 refills | Status: DC

## 2017-12-19 NOTE — Progress Notes (Signed)
ROB/GTT. TDAP given on left deltoid, tolerated well.  C/o right quadrant and leg numbness.

## 2017-12-19 NOTE — Progress Notes (Signed)
Subjective:  Tammie Martinez is a 27 y.o. [name] at 656w6d being seen today for ongoing prenatal care.  She is currently monitored for the following issues for this low-risk pregnancy and has Missed abortion; Supervision of normal pregnancy, antepartum; Rubella non-immune status, antepartum; and Vitamin D deficiency on their problem list.  Patient reports no complaints.  Contractions: Not present. Vag. Bleeding: None.  Movement: Present. Denies leaking of fluid.   The following portions of the patient's history were reviewed and updated as appropriate: allergies, current medications, past family history, past medical history, past social history, past surgical history and problem list. Problem list updated.  Objective:  There were no vitals filed for this visit.  Fetal Status: Fetal Heart Rate (bpm): 150   Movement: Present     General:  Alert, oriented and cooperative. Patient is in no acute distress.  Skin: Skin is warm and dry. No rash noted.   Cardiovascular: Normal heart rate noted  Respiratory: Normal respiratory effort, no problems with respiration noted  Abdomen: Soft, gravid, appropriate for gestational age. Pain/Pressure: Present     Pelvic:  Cervical exam deferred        Extremities: Normal range of motion.  Edema: None  Mental Status: Normal mood and affect. Normal behavior. Normal judgment and thought content.   Urinalysis:      Assessment and Plan:  Pregnancy: [name] at 3156w6d  1. Encounter for supervision of normal pregnancy, antepartum, unspecified gravidity Rx: - Glucose Tolerance, 2 Hours w/1 Hour - CBC - HIV antibody (with reflex) - RPR  2. SGA (small for gestational age) Rx: - US MFM OB FOLLOW UP; Future  Preterm labor symptoms and general obstetric precautions including but not limited to vaginal bleeding, contractions, leaking of fluid and fetal movement were reviewed in detail with the patient. Please refer to After Visit Summary for other counseling  recommendations.  Return in about 2 weeks (around 01/02/2018) for ROB.   Brock BadHarper, Joziyah Roblero A, MD

## 2017-12-20 ENCOUNTER — Other Ambulatory Visit: Payer: Self-pay | Admitting: Obstetrics

## 2017-12-20 DIAGNOSIS — O99019 Anemia complicating pregnancy, unspecified trimester: Secondary | ICD-10-CM

## 2017-12-20 LAB — CBC
HEMATOCRIT: 30.3 % — AB (ref 34.0–46.6)
Hemoglobin: 9.8 g/dL — ABNORMAL LOW (ref 11.1–15.9)
MCH: 28.8 pg (ref 26.6–33.0)
MCHC: 32.3 g/dL (ref 31.5–35.7)
MCV: 89 fL (ref 79–97)
PLATELETS: 282 10*3/uL (ref 150–379)
RBC: 3.4 x10E6/uL — ABNORMAL LOW (ref 3.77–5.28)
RDW: 13.5 % (ref 12.3–15.4)
WBC: 14.8 10*3/uL — ABNORMAL HIGH (ref 3.4–10.8)

## 2017-12-20 LAB — GLUCOSE TOLERANCE, 2 HOURS W/ 1HR
GLUCOSE, FASTING: 78 mg/dL (ref 65–91)
Glucose, 1 hour: 138 mg/dL (ref 65–179)
Glucose, 2 hour: 95 mg/dL (ref 65–152)

## 2017-12-20 LAB — RPR: RPR Ser Ql: NONREACTIVE

## 2017-12-20 LAB — HIV ANTIBODY (ROUTINE TESTING W REFLEX): HIV Screen 4th Generation wRfx: NONREACTIVE

## 2017-12-20 MED ORDER — FERROUS SULFATE 325 (65 FE) MG PO TABS: 325.0000 mg | ORAL_TABLET | Freq: Two times a day (BID) | ORAL | 5 refills | Status: DC

## 2017-12-21 NOTE — Progress Notes (Signed)
Pt informed

## 2018-01-02 ENCOUNTER — Encounter: Payer: Self-pay | Admitting: Obstetrics

## 2018-01-02 ENCOUNTER — Ambulatory Visit (INDEPENDENT_AMBULATORY_CARE_PROVIDER_SITE_OTHER): Payer: Medicaid Other | Admitting: Obstetrics

## 2018-01-02 ENCOUNTER — Ambulatory Visit (HOSPITAL_COMMUNITY)
Admission: RE | Admit: 2018-01-02 | Discharge: 2018-01-02 | Disposition: A | Discharge status: Home / Self Care | Payer: Medicaid Other | Source: Ambulatory Visit | Attending: Obstetrics | Admitting: Obstetrics

## 2018-01-02 ENCOUNTER — Other Ambulatory Visit: Payer: Self-pay | Admitting: Obstetrics

## 2018-01-02 VITALS — BP 119/64 | HR 81 | Wt 168.0 lb

## 2018-01-02 DIAGNOSIS — Z3A30 30 weeks gestation of pregnancy: Secondary | ICD-10-CM | POA: Diagnosis not present

## 2018-01-02 DIAGNOSIS — Z362 Encounter for other antenatal screening follow-up: Secondary | ICD-10-CM

## 2018-01-02 DIAGNOSIS — Z3480 Encounter for supervision of other normal pregnancy, unspecified trimester: Secondary | ICD-10-CM

## 2018-01-02 DIAGNOSIS — Z3483 Encounter for supervision of other normal pregnancy, third trimester: Secondary | ICD-10-CM

## 2018-01-02 NOTE — Progress Notes (Signed)
Subjective:  Tammie Martinez is a 27 y.o. [name] at 7572w6d being seen today for ongoing prenatal care.  She is currently monitored for the following issues for this low-risk pregnancy and has Missed abortion; Supervision of normal pregnancy, antepartum; Rubella non-immune status, antepartum; and Vitamin D deficiency on their problem list.  Patient reports occasional contractions.  Contractions: Not present. Vag. Bleeding: None.  Movement: Present. Denies leaking of fluid.   The following portions of the patient's history were reviewed and updated as appropriate: allergies, current medications, past family history, past medical history, past social history, past surgical history and problem list. Problem list updated.  Objective:   Vitals:   01/02/18 1507  BP: 119/64  Pulse: 81  Weight: 168 lb (76.2 kg)    Fetal Status:     Movement: Present     General:  Alert, oriented and cooperative. Patient is in no acute distress.  Skin: Skin is warm and dry. No rash noted.   Cardiovascular: Normal heart rate noted  Respiratory: Normal respiratory effort, no problems with respiration noted  Abdomen: Soft, gravid, appropriate for gestational age. Pain/Pressure: Absent     Pelvic:  Cervical exam deferred        Extremities: Normal range of motion.     Mental Status: Normal mood and affect. Normal behavior. Normal judgment and thought content.   Urinalysis:      Assessment and Plan:  Pregnancy: [name] at 6772w6d  1. Supervision of other normal pregnancy, antepartum    Preterm labor symptoms and general obstetric precautions including but not limited to vaginal bleeding, contractions, leaking of fluid and fetal movement were reviewed in detail with the patient. Please refer to After Visit Summary for other counseling recommendations.  Return in about 2 weeks (around 01/16/2018) for ROB.   Brock BadHarper, Charles A, MD

## 2018-01-15 ENCOUNTER — Encounter (HOSPITAL_COMMUNITY): Payer: Self-pay

## 2018-01-15 ENCOUNTER — Inpatient Hospital Stay (HOSPITAL_COMMUNITY)
Admission: AD | Admit: 2018-01-15 | Discharge: 2018-01-15 | Disposition: A | Discharge status: Home / Self Care | Payer: Medicaid Other | Source: Ambulatory Visit | Attending: Obstetrics and Gynecology | Admitting: Obstetrics and Gynecology

## 2018-01-15 ENCOUNTER — Telehealth: Payer: Self-pay

## 2018-01-15 DIAGNOSIS — R197 Diarrhea, unspecified: Secondary | ICD-10-CM | POA: Insufficient documentation

## 2018-01-15 DIAGNOSIS — O9989 Other specified diseases and conditions complicating pregnancy, childbirth and the puerperium: Secondary | ICD-10-CM

## 2018-01-15 DIAGNOSIS — Z3A32 32 weeks gestation of pregnancy: Secondary | ICD-10-CM | POA: Insufficient documentation

## 2018-01-15 DIAGNOSIS — O479 False labor, unspecified: Secondary | ICD-10-CM

## 2018-01-15 DIAGNOSIS — O4700 False labor before 37 completed weeks of gestation, unspecified trimester: Secondary | ICD-10-CM

## 2018-01-15 DIAGNOSIS — G43909 Migraine, unspecified, not intractable, without status migrainosus: Secondary | ICD-10-CM | POA: Insufficient documentation

## 2018-01-15 DIAGNOSIS — R51 Headache: Secondary | ICD-10-CM | POA: Diagnosis present

## 2018-01-15 DIAGNOSIS — Z3490 Encounter for supervision of normal pregnancy, unspecified, unspecified trimester: Secondary | ICD-10-CM

## 2018-01-15 DIAGNOSIS — Z880 Allergy status to penicillin: Secondary | ICD-10-CM | POA: Insufficient documentation

## 2018-01-15 DIAGNOSIS — O26893 Other specified pregnancy related conditions, third trimester: Secondary | ICD-10-CM | POA: Insufficient documentation

## 2018-01-15 DIAGNOSIS — G43009 Migraine without aura, not intractable, without status migrainosus: Secondary | ICD-10-CM | POA: Diagnosis not present

## 2018-01-15 DIAGNOSIS — R109 Unspecified abdominal pain: Secondary | ICD-10-CM | POA: Diagnosis not present

## 2018-01-15 MED ORDER — CYCLOBENZAPRINE HCL 10 MG PO TABS: 10.0000 mg | ORAL_TABLET | Freq: Two times a day (BID) | ORAL | 0 refills | Status: DC | PRN

## 2018-01-15 MED ORDER — DIPHENHYDRAMINE HCL 50 MG/ML IJ SOLN
25.0000 mg | Freq: Once | INTRAMUSCULAR | Status: AC
  Administered 2018-01-15: 25 mg via INTRAVENOUS
  Filled 2018-01-15: qty 1

## 2018-01-15 MED ORDER — NIFEDIPINE 10 MG PO CAPS
10.0000 mg | ORAL_CAPSULE | ORAL | Status: DC | PRN
  Administered 2018-01-15: 10 mg via ORAL
  Filled 2018-01-15: qty 1

## 2018-01-15 MED ORDER — DEXAMETHASONE SODIUM PHOSPHATE 10 MG/ML IJ SOLN
10.0000 mg | Freq: Once | INTRAMUSCULAR | Status: AC
  Administered 2018-01-15: 10 mg via INTRAVENOUS
  Filled 2018-01-15: qty 1

## 2018-01-15 MED ORDER — LACTATED RINGERS IV SOLN
INTRAVENOUS | Status: DC
  Administered 2018-01-15: 20:00:00 via INTRAVENOUS

## 2018-01-15 NOTE — Telephone Encounter (Signed)
TC to pt regarding message. Pt c/o HA's and straining in Right eye x 3 days  And pressure Pt now states she is fine. Pt advised OTC safe medication and to increase water intake, however if sx worsen to report to hospital for evaluation. Pt voiced understanding.

## 2018-01-15 NOTE — MAU Note (Signed)
Pt has been having abdominal pain that is sharp and shooting that goes from her lower abdomen to her vagina. Also having a headache since Thursday not relieved with tylenol.

## 2018-01-15 NOTE — MAU Provider Note (Addendum)
Chief Complaint:  Abdominal Pain and Headache   First Provider Initiated Contact with Patient 01/15/18 1824     HPI: Tammie Martinez is a 27 y.o. [name] at 70w5dwho presents to maternity admissions reporting headache with concentration of pain around the right eye.  Has not gotten relief from Tylenol.  Also has had loose stools this week with sharp cramps in lower abdomen down toward mons. Has had vaginal spotting last week but did not mention it.  + IC last 24 hrs.  States has not had contractions. . She reports good fetal movement, denies LOF, vaginal itching/burning, urinary symptoms, h/a, dizziness, n/v, constipation or fever/chills. .  Abdominal Pain  This is a new problem. The current episode started in the past 7 days. The problem occurs intermittently. The problem has been unchanged. The pain is located in the LLQ, RLQ and suprapubic region. The quality of the pain is sharp. Pain radiation: vagina. Associated symptoms include diarrhea and headaches. Pertinent negatives include no constipation, dysuria, fever, myalgias, nausea or vomiting. Nothing aggravates the pain. The pain is relieved by nothing. She has tried nothing for the symptoms.  Headache   This is a new problem. The current episode started in the past 7 days. The problem occurs constantly. The problem has been unchanged. The pain is located in the frontal and retro-orbital region. The pain does not radiate. The pain quality is not similar to prior headaches. The quality of the pain is described as aching. Associated symptoms include abdominal pain and photophobia. Pertinent negatives include no back pain, blurred vision, fever, nausea, sinus pressure, visual change or vomiting. The symptoms are aggravated by bright light. She has tried acetaminophen for the symptoms. The treatment provided no relief. There is no history of migraine headaches.     Past Medical History: History reviewed. No pertinent past medical history.  Past  obstetric history: OB History  Gravida Para Term Preterm AB Living  SAB TAB Ectopic Multiple Live Births  2       1    # Outcome Date GA Lbr Len/2nd Weight Sex Delivery Anes PTL Lv  4 Current           3 SAB 02/2017 [redacted]w[redacted]d         2 Term 03/17/10 [redacted]w[redacted]d   M Vag-Spont   LIV  1 SAB 2009 [redacted]w[redacted]d       DEC    Past Surgical History: Past Surgical History:  Procedure Laterality Date  . NO PAST SURGERIES      Family History: Family History  Problem Relation Age of Onset  . Heart disease Father   . Hypertension Maternal Grandfather   . COPD Maternal Grandfather     Social History: Social History   Tobacco Use  . Smoking status: Never Smoker  . Smokeless tobacco: Never Used  Substance Use Topics  . Alcohol use: No    Alcohol/week: 4.2 oz    Types: 7 Glasses of wine per week    Frequency: Never    Comment: drinks on weekends  . Drug use: No    Types: Marijuana    Comment: prior to pregnancy    Allergies:  Allergies  Allergen Reactions  . Penicillins Hives    Has patient had a PCN reaction causing immediate rash, facial/tongue/throat swelling, SOB or lightheadedness with hypotension: Yes Has patient had a PCN reaction causing severe rash involving mucus membranes or skin necrosis: Yes Has patient had a PCN reaction  that required hospitalization: No Has patient had a PCN reaction occurring within the last 10 years: No If all of the above answers are "NO", then may proceed with Cephalosporin use.   . Sulfa Antibiotics Hives    Meds:  Medications Prior to Admission  Medication Sig Dispense Refill Last Dose  . acetaminophen (TYLENOL) 500 MG tablet Take 1,000 mg by mouth every 6 (six) hours as needed for mild pain or headache.    01/14/2018 at Unknown time  . Acetaminophen-Codeine (TYLENOL/CODEINE #3) 300-30 MG tablet Take 1 tablet by mouth every 4 (four) hours as needed for pain.   01/14/2018 at Unknown time  . ferrous sulfate 325 (65 FE) MG tablet Take 1 tablet  (325 mg total) by mouth 2 (two) times daily with a meal. 60 tablet 5 01/15/2018 at Unknown time  . Prenatal-Fe Fum-Methf-FA w/o A (VITAFOL-NANO) 18-0.6-0.4 MG TABS Take 1 tablet by mouth at bedtime. 30 tablet 12 01/15/2018 at Unknown time  . cephALEXin (KEFLEX) 500 MG capsule Take 1 capsule (500 mg total) by mouth 4 (four) times daily. (Patient not taking: Reported on 12/05/2017) 40 capsule 0 Not Taking  . clindamycin (CLEOCIN) 300 MG capsule Take 1 capsule (300 mg total) by mouth 3 (three) times daily. 21 capsule 0   . Doxylamine-Pyridoxine (DICLEGIS) 10-10 MG TBEC Take 1 tablet with breakfast and lunch.  Take 2 tablets at bedtime. 100 tablet 4 Taking  . ondansetron (ZOFRAN) 8 MG tablet Take 1 tablet (8 mg total) by mouth every 8 (eight) hours as needed for nausea or vomiting. 40 tablet 2 Taking  . promethazine (PHENERGAN) 12.5 MG tablet Take 1 tablet (12.5 mg total) by mouth every 6 (six) hours as needed for nausea or vomiting. 30 tablet 0 Taking  . Vitamin D, Ergocalciferol, (DRISDOL) 50000 units CAPS capsule Take 1 capsule (50,000 Units total) by mouth every 7 (seven) days. 30 capsule 2 Taking    I have reviewed patient's Past Medical Hx, Surgical Hx, Family Hx, Social Hx, medications and allergies.   ROS:  Review of Systems  Constitutional: Negative for fever.  HENT: Negative for sinus pressure.   Eyes: Positive for photophobia. Negative for blurred vision.  Gastrointestinal: Positive for abdominal pain and diarrhea. Negative for constipation, nausea and vomiting.  Genitourinary: Negative for dysuria.  Musculoskeletal: Negative for back pain and myalgias.  Neurological: Positive for headaches.   Other systems negative  Physical Exam   Patient Vitals for the past 24 hrs:  BP Temp Temp src Pulse Resp Weight  01/15/18 1833 125/64 98.8 F (37.1 C) Oral 77 16 -  01/15/18 1823 - - - - - 171 lb 14.4 oz (78 kg)   Constitutional: Well-developed, well-nourished female in no acute distress.   Cardiovascular: normal rate and rhythm Respiratory: normal effort, clear to auscultation bilaterally GI: Abd soft, non-tender, gravid appropriate for gestational age.   No rebound or guarding. MS: Extremities nontender, no edema, normal ROM Neurologic: Alert and oriented x 4.  GU: Neg CVAT.  PELVIC EXAM: Dilation: Fingertip Effacement (%): Thick, 50 Cervical Position: Posterior Station: Ballotable Exam by: M. Maris Berger  Recheck @ 2110: Dilation: Fingertip Effacement (%): Thick, 50 Cervical Position: Posterior Station: Ballotable Exam by:: Carloyn Jaeger, CNM   FHT:  Baseline 140 , moderate variability, accelerations present, no decelerations Contractions: q 6- 7 mins Irregular  // Occ. UI noted prior to d/c home   Labs: O/Positive/-- (12/21 1120) No results found for this or any previous visit (from the past 24 hour(s)).  MAU Course/MDM: I could not sent fetal fibronectin due to recent intercourse.  NST reviewed  Treatments in MAU included IV fluids, Decadron and Benadryl, Procardia for tocolysis -- resolved  .    Assessment: Single IUP at [redacted]w[redacted]d Migraine headache Loose stools and abdominal cramping  Plan: IV hydration Migraine cocktail Procardia for tocolysis Report to oncoming provider  Wynelle Bourgeois CNM, MSN Certified Nurse-Midwife 01/15/2018 9:14 PM  Headache - Rx Flexeril 10 mg every 6 hours prn H/A - Information provided on Headache & Flexeril   Preterm Contractions - Stay well-hydrated - Information provided on Preterm Contractions - Keep scheduled appointment with [facility] on 01/17/18  - Discharge home - Patient verbalized an understanding of the plan of care and agrees.

## 2018-01-16 ENCOUNTER — Encounter: Payer: Medicaid Other | Admitting: Obstetrics

## 2018-01-17 ENCOUNTER — Encounter: Payer: Self-pay | Admitting: Obstetrics

## 2018-01-17 ENCOUNTER — Ambulatory Visit (INDEPENDENT_AMBULATORY_CARE_PROVIDER_SITE_OTHER): Payer: Medicaid Other | Admitting: Obstetrics

## 2018-01-17 VITALS — BP 116/69 | HR 85 | Wt 175.0 lb

## 2018-01-17 DIAGNOSIS — M545 Low back pain, unspecified: Secondary | ICD-10-CM

## 2018-01-17 DIAGNOSIS — Z3483 Encounter for supervision of other normal pregnancy, third trimester: Secondary | ICD-10-CM

## 2018-01-17 DIAGNOSIS — O26893 Other specified pregnancy related conditions, third trimester: Secondary | ICD-10-CM

## 2018-01-17 DIAGNOSIS — Z3490 Encounter for supervision of normal pregnancy, unspecified, unspecified trimester: Secondary | ICD-10-CM

## 2018-01-17 MED ORDER — COMFORT FIT MATERNITY SUPP SM MISC: 0 refills | Status: DC

## 2018-01-17 NOTE — Progress Notes (Signed)
Subjective:  Tammie Martinez is a 27 y.o. [id number] at [redacted]w[redacted]d being seen today for ongoing prenatal care.  She is currently monitored for the following issues for this low-risk pregnancy and has Missed abortion; Supervision of normal pregnancy, antepartum; Rubella non-immune status, antepartum; Vitamin D deficiency; and Migraine on their problem list.  Patient reports backache.  Contractions: Irregular. Vag. Bleeding: None.  Movement: Present. Denies leaking of fluid.   The following portions of the patient's history were reviewed and updated as appropriate: allergies, current medications, past family history, past medical history, past social history, past surgical history and problem list. Problem list updated.  Objective:   Vitals:   01/17/18 1015  BP: 116/69  Pulse: 85  Weight: 175 lb (79.4 kg)    Fetal Status: Fetal Heart Rate (bpm): 150   Movement: Present     General:  Alert, oriented and cooperative. Patient is in no acute distress.  Skin: Skin is warm and dry. No rash noted.   Cardiovascular: Normal heart rate noted  Respiratory: Normal respiratory effort, no problems with respiration noted  Abdomen: Soft, gravid, appropriate for gestational age. Pain/Pressure: Absent     Pelvic:  Cervical exam deferred        Extremities: Normal range of motion.  Edema: None  Mental Status: Normal mood and affect. Normal behavior. Normal judgment and thought content.   Urinalysis:      Assessment and Plan:  Pregnancy: [name] at [redacted]w[redacted]d  1. Encounter for supervision of normal pregnancy, antepartum, unspecified gravidity  2. Acute midline low back pain without sciatica Rx: - Elastic Bandages & Supports (COMFORT FIT MATERNITY SUPP SM) MISC; Wear as directed.  Dispense: 1 each; Refill: 0  Preterm labor symptoms and general obstetric precautions including but not limited to vaginal bleeding, contractions, leaking of fluid and fetal movement were reviewed in detail with the patient. Please refer to  After Visit Summary for other counseling recommendations.  Return in about 2 weeks (around 01/31/2018) for ROB.   Brock Bad, MD

## 2018-01-17 NOTE — Progress Notes (Signed)
Patient reports fetal movement with irregular contractions. 

## 2018-01-23 ENCOUNTER — Encounter: Payer: Self-pay | Admitting: *Deleted

## 2018-01-31 ENCOUNTER — Ambulatory Visit (INDEPENDENT_AMBULATORY_CARE_PROVIDER_SITE_OTHER): Payer: Medicaid Other | Admitting: Obstetrics

## 2018-01-31 ENCOUNTER — Encounter: Payer: Self-pay | Admitting: Obstetrics

## 2018-01-31 ENCOUNTER — Other Ambulatory Visit (HOSPITAL_COMMUNITY)
Admission: RE | Admit: 2018-01-31 | Discharge: 2018-01-31 | Disposition: A | Discharge status: Home / Self Care | Payer: Medicaid Other | Source: Ambulatory Visit | Attending: Obstetrics | Admitting: Obstetrics

## 2018-01-31 VITALS — BP 121/66 | HR 77 | Wt 176.2 lb

## 2018-01-31 DIAGNOSIS — Z3483 Encounter for supervision of other normal pregnancy, third trimester: Secondary | ICD-10-CM | POA: Diagnosis present

## 2018-01-31 DIAGNOSIS — K029 Dental caries, unspecified: Secondary | ICD-10-CM

## 2018-01-31 DIAGNOSIS — Z3493 Encounter for supervision of normal pregnancy, unspecified, third trimester: Secondary | ICD-10-CM

## 2018-01-31 DIAGNOSIS — Z3490 Encounter for supervision of normal pregnancy, unspecified, unspecified trimester: Secondary | ICD-10-CM

## 2018-01-31 DIAGNOSIS — B379 Candidiasis, unspecified: Secondary | ICD-10-CM

## 2018-01-31 LAB — OB RESULTS CONSOLE GBS: GBS: NEGATIVE

## 2018-01-31 LAB — OB RESULTS CONSOLE GC/CHLAMYDIA: GC PROBE AMP, GENITAL: NEGATIVE

## 2018-01-31 MED ORDER — OXYCODONE-ACETAMINOPHEN 10-325 MG PO TABS: 1.0000 | ORAL_TABLET | Freq: Four times a day (QID) | ORAL | 0 refills | Status: DC | PRN

## 2018-01-31 MED ORDER — CLINDAMYCIN HCL 300 MG PO CAPS: 300.0000 mg | ORAL_CAPSULE | Freq: Three times a day (TID) | ORAL | 1 refills | Status: DC

## 2018-01-31 MED ORDER — FLUCONAZOLE 150 MG PO TABS: 150.0000 mg | ORAL_TABLET | Freq: Once | ORAL | 0 refills | Status: DC

## 2018-01-31 NOTE — Progress Notes (Signed)
Subjective:  Tammie Martinez is a 27 y.o. [id number] at [redacted]w[redacted]d being seen today for ongoing prenatal care.  She is currently monitored for the following issues for this low-risk pregnancy and has Missed abortion; Supervision of normal pregnancy, antepartum; Rubella non-immune status, antepartum; Vitamin D deficiency; and Migraine on their problem list.  Patient reports toothache.  Contractions: Irregular. Vag. Bleeding: None.  Movement: Present. Denies leaking of fluid.   The following portions of the patient's history were reviewed and updated as appropriate: allergies, current medications, past family history, past medical history, past social history, past surgical history and problem list. Problem list updated.  Objective:   Vitals:   01/31/18 1027  BP: 121/66  Pulse: 77  Weight: 176 lb 3.2 oz (79.9 kg)    Fetal Status: Fetal Heart Rate (bpm): 150   Movement: Present     General:  Alert, oriented and cooperative. Patient is in no acute distress.  Skin: Skin is warm and dry. No rash noted.   Cardiovascular: Normal heart rate noted  Respiratory: Normal respiratory effort, no problems with respiration noted  Abdomen: Soft, gravid, appropriate for gestational age. Pain/Pressure: Absent     Pelvic:  Cervical exam deferred        Extremities: Normal range of motion.  Edema: Trace  Mental Status: Normal mood and affect. Normal behavior. Normal judgment and thought content.   Urinalysis:      Assessment and Plan:  Pregnancy: [name] at [redacted]w[redacted]d  1. Encounter for supervision of normal pregnancy, antepartum, unspecified gravidity Rx: - Strep Gp B Culture+Rflx - GC/Chlamydia probe amp ([place])not at [facility]  2. Dental caries Rx: - oxyCODONE-acetaminophen (PERCOCET) 10-325 MG tablet; Take 1 tablet by mouth every 6 (six) hours as needed for pain.  Dispense: 20 tablet; Refill: 0 - clindamycin (CLEOCIN) 300 MG capsule; Take 1 capsule (300 mg total) by mouth 3 (three) times daily.  Dispense: 21  capsule; Refill: 1  3. Yeast infection Rx: - fluconazole (DIFLUCAN) 150 MG tablet; Take 1 tablet (150 mg total) by mouth once for 1 dose.  Dispense: 1 tablet; Refill: 0   Preterm labor symptoms and general obstetric precautions including but not limited to vaginal bleeding, contractions, leaking of fluid and fetal movement were reviewed in detail with the patient. Please refer to After Visit Summary for other counseling recommendations.  Return in about 1 week (around 02/07/2018) for ROB.   Brock Bad, MD

## 2018-02-01 LAB — GC/CHLAMYDIA PROBE AMP (~~LOC~~) NOT AT ARMC
Chlamydia: NEGATIVE
NEISSERIA GONORRHEA: NEGATIVE

## 2018-02-04 LAB — STREP GP B CULTURE+RFLX: Strep Gp B Culture+Rflx: NEGATIVE

## 2018-02-07 ENCOUNTER — Encounter: Payer: Self-pay | Admitting: Obstetrics

## 2018-02-07 ENCOUNTER — Ambulatory Visit (INDEPENDENT_AMBULATORY_CARE_PROVIDER_SITE_OTHER): Payer: Medicaid Other | Admitting: Obstetrics

## 2018-02-07 DIAGNOSIS — Z3490 Encounter for supervision of normal pregnancy, unspecified, unspecified trimester: Secondary | ICD-10-CM

## 2018-02-07 DIAGNOSIS — Z3483 Encounter for supervision of other normal pregnancy, third trimester: Secondary | ICD-10-CM

## 2018-02-07 NOTE — Progress Notes (Signed)
Subjective:  Tammie Martinez is a 27 y.o. [id number] at [redacted]w[redacted]d being seen today for ongoing prenatal care.  She is currently monitored for the following issues for this low-risk pregnancy and has Missed abortion; Supervision of normal pregnancy, antepartum; Rubella non-immune status, antepartum; Vitamin D deficiency; and Migraine on their problem list.  Patient reports no complaints.  Contractions: Irregular. Vag. Bleeding: None.  Movement: Present. Denies leaking of fluid.   The following portions of the patient's history were reviewed and updated as appropriate: allergies, current medications, past family history, past medical history, past social history, past surgical history and problem list. Problem list updated.  Objective:   Vitals:   02/07/18 1106  BP: 118/66  Pulse: 75  Weight: 177 lb 8 oz (80.5 kg)    Fetal Status:     Movement: Present     General:  Alert, oriented and cooperative. Patient is in no acute distress.  Skin: Skin is warm and dry. No rash noted.   Cardiovascular: Normal heart rate noted  Respiratory: Normal respiratory effort, no problems with respiration noted  Abdomen: Soft, gravid, appropriate for gestational age. Pain/Pressure: Present     Pelvic:  Cervical exam deferred        Extremities: Normal range of motion.  Edema: Trace  Mental Status: Normal mood and affect. Normal behavior. Normal judgment and thought content.   Urinalysis:      Assessment and Plan:  Pregnancy: [name] at [redacted]w[redacted]d  1. Encounter for supervision of normal pregnancy, antepartum, unspecified gravidity   Preterm labor symptoms and general obstetric precautions including but not limited to vaginal bleeding, contractions, leaking of fluid and fetal movement were reviewed in detail with the patient. Please refer to After Visit Summary for other counseling recommendations.  Return in about 1 week (around 02/14/2018) for ROB.   Brock Bad, MD

## 2018-02-15 ENCOUNTER — Encounter: Payer: Self-pay | Admitting: Obstetrics

## 2018-02-15 ENCOUNTER — Ambulatory Visit (INDEPENDENT_AMBULATORY_CARE_PROVIDER_SITE_OTHER): Payer: Medicaid Other | Admitting: Obstetrics

## 2018-02-15 VITALS — BP 115/71 | HR 80 | Wt 182.0 lb

## 2018-02-15 DIAGNOSIS — Z3490 Encounter for supervision of normal pregnancy, unspecified, unspecified trimester: Secondary | ICD-10-CM

## 2018-02-15 DIAGNOSIS — Z3483 Encounter for supervision of other normal pregnancy, third trimester: Secondary | ICD-10-CM

## 2018-02-15 NOTE — Progress Notes (Signed)
Subjective:  Tammie Martinez is a 27 y.o. [id number] at [redacted]w[redacted]d being seen today for ongoing prenatal care.  She is currently monitored for the following issues for this low-risk pregnancy and has Missed abortion; Supervision of normal pregnancy, antepartum; Rubella non-immune status, antepartum; Vitamin D deficiency; and Migraine on their problem list.  Patient reports occasional contractions.  Contractions: Irregular. Vag. Bleeding: None.  Movement: Present. Denies leaking of fluid.   The following portions of the patient's history were reviewed and updated as appropriate: allergies, current medications, past family history, past medical history, past social history, past surgical history and problem list. Problem list updated.  Objective:   Vitals:   02/15/18 1140  BP: 115/71  Pulse: 80  Weight: 182 lb (82.6 kg)    Fetal Status: Fetal Heart Rate (bpm): 150   Movement: Present     General:  Alert, oriented and cooperative. Patient is in no acute distress.  Skin: Skin is warm and dry. No rash noted.   Cardiovascular: Normal heart rate noted  Respiratory: Normal respiratory effort, no problems with respiration noted  Abdomen: Soft, gravid, appropriate for gestational age. Pain/Pressure: Absent     Pelvic:  Cervical exam deferred        Extremities: Normal range of motion.     Mental Status: Normal mood and affect. Normal behavior. Normal judgment and thought content.   Urinalysis:      Assessment and Plan:  Pregnancy: [name] at [redacted]w[redacted]d  1. Encounter for supervision of normal pregnancy, antepartum, unspecified gravidity   Term labor symptoms and general obstetric precautions including but not limited to vaginal bleeding, contractions, leaking of fluid and fetal movement were reviewed in detail with the patient. Please refer to After Visit Summary for other counseling recommendations.  Return in about 1 week (around 02/22/2018) for ROB.   Brock Bad, MD

## 2018-02-22 ENCOUNTER — Ambulatory Visit (INDEPENDENT_AMBULATORY_CARE_PROVIDER_SITE_OTHER): Payer: Medicaid Other | Admitting: Obstetrics

## 2018-02-22 ENCOUNTER — Encounter: Payer: Self-pay | Admitting: Obstetrics

## 2018-02-22 DIAGNOSIS — Z3493 Encounter for supervision of normal pregnancy, unspecified, third trimester: Secondary | ICD-10-CM

## 2018-02-22 DIAGNOSIS — Z3490 Encounter for supervision of normal pregnancy, unspecified, unspecified trimester: Secondary | ICD-10-CM

## 2018-02-22 NOTE — Progress Notes (Signed)
Subjective:  Tammie Martinez is a 27 y.o. [name] at 4051w1d being seen today for ongoing prenatal care.  She is currently monitored for the following issues for this low-risk pregnancy and has Missed abortion; Supervision of normal pregnancy, antepartum; Rubella non-immune status, antepartum; Vitamin D deficiency; and Migraine on their problem list.  Patient reports occasional contractions.  Contractions: Irregular. Vag. Bleeding: None.  Movement: Present. Denies leaking of fluid.   The following portions of the patient's history were reviewed and updated as appropriate: allergies, current medications, past family history, past medical history, past social history, past surgical history and problem list. Problem list updated.  Objective:   Vitals:   02/22/18 1053  BP: 110/63  Pulse: 67  Weight: 183 lb 14.4 oz (83.4 kg)    Fetal Status: Fetal Heart Rate (bpm): 150   Movement: Present     General:  Alert, oriented and cooperative. Patient is in no acute distress.  Skin: Skin is warm and dry. No rash noted.   Cardiovascular: Normal heart rate noted  Respiratory: Normal respiratory effort, no problems with respiration noted  Abdomen: Soft, gravid, appropriate for gestational age. Pain/Pressure: Present     Pelvic:  Cervical exam deferred        Extremities: Normal range of motion.  Edema: None  Mental Status: Normal mood and affect. Normal behavior. Normal judgment and thought content.   Urinalysis:      Assessment and Plan:  Pregnancy: [name] at 2751w1d  1. Encounter for supervision of normal pregnancy, antepartum, unspecified gravidity   Term labor symptoms and general obstetric precautions including but not limited to vaginal bleeding, contractions, leaking of fluid and fetal movement were reviewed in detail with the patient. Please refer to After Visit Summary for other counseling recommendations.  Return in about 1 week (around 03/01/2018) for ROB.   Brock BadHarper, Curtis Uriarte A, MD

## 2018-02-27 ENCOUNTER — Inpatient Hospital Stay (HOSPITAL_COMMUNITY): Payer: Medicaid Other | Admitting: Anesthesiology

## 2018-02-27 ENCOUNTER — Other Ambulatory Visit: Payer: Self-pay

## 2018-02-27 ENCOUNTER — Encounter (HOSPITAL_COMMUNITY): Payer: Self-pay | Admitting: *Deleted

## 2018-02-27 ENCOUNTER — Inpatient Hospital Stay (HOSPITAL_COMMUNITY)
Admission: AD | Admit: 2018-02-27 | Discharge: 2018-03-01 | DRG: 807 | Disposition: A | Discharge status: Home / Self Care | Payer: Medicaid Other | Attending: Obstetrics & Gynecology | Admitting: Obstetrics & Gynecology

## 2018-02-27 DIAGNOSIS — Z880 Allergy status to penicillin: Secondary | ICD-10-CM

## 2018-02-27 DIAGNOSIS — Z3A38 38 weeks gestation of pregnancy: Secondary | ICD-10-CM

## 2018-02-27 DIAGNOSIS — Z3483 Encounter for supervision of other normal pregnancy, third trimester: Secondary | ICD-10-CM | POA: Diagnosis present

## 2018-02-27 LAB — CBC
HEMATOCRIT: 35.3 % — AB (ref 36.0–46.0)
HEMOGLOBIN: 11.6 g/dL — AB (ref 12.0–15.0)
MCH: 28.9 pg (ref 26.0–34.0)
MCHC: 32.9 g/dL (ref 30.0–36.0)
MCV: 87.8 fL (ref 78.0–100.0)
Platelets: 262 10*3/uL (ref 150–400)
RBC: 4.02 MIL/uL (ref 3.87–5.11)
RDW: 14.1 % (ref 11.5–15.5)
WBC: 16.2 10*3/uL — ABNORMAL HIGH (ref 4.0–10.5)

## 2018-02-27 LAB — AMNISURE RUPTURE OF MEMBRANE (ROM) NOT AT ARMC: Amnisure ROM: POSITIVE

## 2018-02-27 LAB — TYPE AND SCREEN
ABO/RH(D): O POS
Antibody Screen: NEGATIVE

## 2018-02-27 LAB — POCT FERN TEST: POCT FERN TEST: NEGATIVE

## 2018-02-27 LAB — WET PREP, GENITAL
CLUE CELLS WET PREP: NONE SEEN
Sperm: NONE SEEN
Trich, Wet Prep: NONE SEEN
YEAST WET PREP: NONE SEEN

## 2018-02-27 MED ORDER — FENTANYL CITRATE (PF) 100 MCG/2ML IJ SOLN: 100.0000 ug | INTRAMUSCULAR | Status: DC | PRN

## 2018-02-27 MED ORDER — EPHEDRINE 5 MG/ML INJ
10.0000 mg | INTRAVENOUS | Status: DC | PRN
  Filled 2018-02-27: qty 2

## 2018-02-27 MED ORDER — IBUPROFEN 600 MG PO TABS
600.0000 mg | ORAL_TABLET | Freq: Four times a day (QID) | ORAL | Status: DC
  Administered 2018-02-28 – 2018-03-01 (×7): 600 mg via ORAL
  Filled 2018-02-27 (×7): qty 1

## 2018-02-27 MED ORDER — WITCH HAZEL-GLYCERIN EX PADS: 1.0000 "application " | MEDICATED_PAD | CUTANEOUS | Status: DC | PRN

## 2018-02-27 MED ORDER — BENZOCAINE-MENTHOL 20-0.5 % EX AERO
1.0000 "application " | INHALATION_SPRAY | CUTANEOUS | Status: DC | PRN
  Administered 2018-02-28: 1 via TOPICAL
  Filled 2018-02-27: qty 56

## 2018-02-27 MED ORDER — COCONUT OIL OIL: 1.0000 "application " | TOPICAL_OIL | Status: DC | PRN

## 2018-02-27 MED ORDER — OXYTOCIN 40 UNITS IN LACTATED RINGERS INFUSION - SIMPLE MED
1.0000 m[IU]/min | INTRAVENOUS | Status: DC
  Administered 2018-02-27: 2 m[IU]/min via INTRAVENOUS

## 2018-02-27 MED ORDER — PHENYLEPHRINE 40 MCG/ML (10ML) SYRINGE FOR IV PUSH (FOR BLOOD PRESSURE SUPPORT)
80.0000 ug | PREFILLED_SYRINGE | INTRAVENOUS | Status: DC | PRN
  Filled 2018-02-27: qty 10
  Filled 2018-02-27: qty 5

## 2018-02-27 MED ORDER — LACTATED RINGERS IV SOLN: 500.0000 mL | Freq: Once | INTRAVENOUS | Status: DC

## 2018-02-27 MED ORDER — ZOLPIDEM TARTRATE 5 MG PO TABS: 5.0000 mg | ORAL_TABLET | Freq: Every evening | ORAL | Status: DC | PRN

## 2018-02-27 MED ORDER — LACTATED RINGERS IV SOLN: 500.0000 mL | INTRAVENOUS | Status: DC | PRN

## 2018-02-27 MED ORDER — PHENYLEPHRINE 40 MCG/ML (10ML) SYRINGE FOR IV PUSH (FOR BLOOD PRESSURE SUPPORT)
80.0000 ug | PREFILLED_SYRINGE | INTRAVENOUS | Status: DC | PRN
  Filled 2018-02-27: qty 5
  Filled 2018-02-27: qty 10

## 2018-02-27 MED ORDER — ONDANSETRON HCL 4 MG/2ML IJ SOLN: 4.0000 mg | Freq: Four times a day (QID) | INTRAMUSCULAR | Status: DC | PRN

## 2018-02-27 MED ORDER — LACTATED RINGERS IV SOLN
INTRAVENOUS | Status: DC
  Administered 2018-02-27: 10:00:00 via INTRAVENOUS

## 2018-02-27 MED ORDER — PRENATAL MULTIVITAMIN CH
1.0000 | ORAL_TABLET | Freq: Every day | ORAL | Status: DC
  Administered 2018-02-28 – 2018-03-01 (×2): 1 via ORAL
  Filled 2018-02-27 (×2): qty 1

## 2018-02-27 MED ORDER — ONDANSETRON HCL 4 MG/2ML IJ SOLN: 4.0000 mg | INTRAMUSCULAR | Status: DC | PRN

## 2018-02-27 MED ORDER — FENTANYL 2.5 MCG/ML BUPIVACAINE 1/10 % EPIDURAL INFUSION (WH - ANES): 14.0000 mL/h | INTRAMUSCULAR | Status: DC | PRN

## 2018-02-27 MED ORDER — TETANUS-DIPHTH-ACELL PERTUSSIS 5-2.5-18.5 LF-MCG/0.5 IM SUSP: 0.5000 mL | Freq: Once | INTRAMUSCULAR | Status: DC

## 2018-02-27 MED ORDER — DIBUCAINE 1 % RE OINT: 1.0000 "application " | TOPICAL_OINTMENT | RECTAL | Status: DC | PRN

## 2018-02-27 MED ORDER — TERBUTALINE SULFATE 1 MG/ML IJ SOLN
0.2500 mg | Freq: Once | INTRAMUSCULAR | Status: DC | PRN
  Filled 2018-02-27: qty 1

## 2018-02-27 MED ORDER — ACETAMINOPHEN 325 MG PO TABS
650.0000 mg | ORAL_TABLET | ORAL | Status: DC | PRN
  Administered 2018-02-28 (×2): 650 mg via ORAL
  Filled 2018-02-27 (×2): qty 2

## 2018-02-27 MED ORDER — SOD CITRATE-CITRIC ACID 500-334 MG/5ML PO SOLN: 30.0000 mL | ORAL | Status: DC | PRN

## 2018-02-27 MED ORDER — DIPHENHYDRAMINE HCL 25 MG PO CAPS: 25.0000 mg | ORAL_CAPSULE | Freq: Four times a day (QID) | ORAL | Status: DC | PRN

## 2018-02-27 MED ORDER — FENTANYL 2.5 MCG/ML BUPIVACAINE 1/10 % EPIDURAL INFUSION (WH - ANES)
14.0000 mL/h | INTRAMUSCULAR | Status: DC | PRN
  Administered 2018-02-27: 14 mL/h via EPIDURAL
  Filled 2018-02-27: qty 100

## 2018-02-27 MED ORDER — OXYTOCIN 40 UNITS IN LACTATED RINGERS INFUSION - SIMPLE MED
2.5000 [IU]/h | INTRAVENOUS | Status: DC
  Filled 2018-02-27: qty 1000

## 2018-02-27 MED ORDER — LIDOCAINE HCL (PF) 1 % IJ SOLN
INTRAMUSCULAR | Status: DC | PRN
Start: 2018-02-27 — End: 2018-02-27
  Administered 2018-02-27 (×2): 4 mL via EPIDURAL

## 2018-02-27 MED ORDER — OXYCODONE-ACETAMINOPHEN 5-325 MG PO TABS: 2.0000 | ORAL_TABLET | ORAL | Status: DC | PRN

## 2018-02-27 MED ORDER — SENNOSIDES-DOCUSATE SODIUM 8.6-50 MG PO TABS
2.0000 | ORAL_TABLET | ORAL | Status: DC
  Administered 2018-02-28 (×2): 2 via ORAL
  Filled 2018-02-27 (×2): qty 2

## 2018-02-27 MED ORDER — LIDOCAINE HCL (PF) 1 % IJ SOLN
30.0000 mL | INTRAMUSCULAR | Status: DC | PRN
  Filled 2018-02-27: qty 30

## 2018-02-27 MED ORDER — ACETAMINOPHEN 325 MG PO TABS: 650.0000 mg | ORAL_TABLET | ORAL | Status: DC | PRN

## 2018-02-27 MED ORDER — SIMETHICONE 80 MG PO CHEW: 80.0000 mg | CHEWABLE_TABLET | ORAL | Status: DC | PRN

## 2018-02-27 MED ORDER — OXYCODONE-ACETAMINOPHEN 5-325 MG PO TABS: 1.0000 | ORAL_TABLET | ORAL | Status: DC | PRN

## 2018-02-27 MED ORDER — ONDANSETRON HCL 4 MG PO TABS: 4.0000 mg | ORAL_TABLET | ORAL | Status: DC | PRN

## 2018-02-27 MED ORDER — DIPHENHYDRAMINE HCL 50 MG/ML IJ SOLN: 12.5000 mg | INTRAMUSCULAR | Status: DC | PRN

## 2018-02-27 MED ORDER — OXYTOCIN BOLUS FROM INFUSION
500.0000 mL | Freq: Once | INTRAVENOUS | Status: AC
  Administered 2018-02-27: 500 mL via INTRAVENOUS

## 2018-02-27 NOTE — Progress Notes (Signed)
[place] Tammie Martinez is a 27 y.o. [name] at 7213w6d admitted for rupture of membranes  Subjective: Pt is uncomfortable w/ contractions. Wanting epidural placed  Objective: BP (!) 127/55   Pulse 67   Temp 98.7 F (37.1 C) (Oral)   Resp 18   Ht 5' 4.5" (1.638 m)   Wt 189 lb 1.9 oz (85.8 kg)   LMP 06/24/2017   SpO2 96%   BMI 31.96 kg/m  No intake/output data recorded. No intake/output data recorded.  FHT:  FHR: 130 bpm, variability: moderate,  accelerations:  Present,  decelerations:  Present occasional variable UC:   regular, every 2-5 minutes SVE:   Dilation: 3.5 Effacement (%): 70 Station: -2 Exam by:: Tammie Junes. Goodman, RN  Labs: Lab Results  Component Value Date   WBC 16.2 (H) 02/27/2018   HGB 11.6 (L) 02/27/2018   HCT 35.3 (L) 02/27/2018   MCV 87.8 02/27/2018   PLT 262 02/27/2018    Assessment / Plan: SROM  Labor: Progressing on Pitocin, will continue to increase then AROM Fetal Wellbeing:  Category I Pain Control:  Epidural I/D:  n/a Anticipated MOD:  NSVD  Tammie Martinez 02/27/2018, 4:56 PM

## 2018-02-27 NOTE — Anesthesia Pain Management Evaluation Note (Signed)
  CRNA Pain Management Visit Note  Patient: Tammie Martinez, 27 y.o., female  "[facility]. We have an anesthesia team available at all times to provide care throughout the hospital, including epidural management and anesthesia for C-section. I don't know your plan for the delivery whether it a natural birth, water birth, IV sedation, nitrous supplementation, doula or epidural, but we want to meet your pain goals."   1.Was your pain managed to your expectations on prior hospitalizations?   Yes   2.What is your expectation for pain management during this hospitalization?     Epidural  3.How can we help you reach that goal? Epidural when needed  Record the patient's initial score and the patient's pain goal.   Pain: 0  Pain Goal: 5 [facility] wants you to be able to say your pain was always managed very well.  Jaheem Hedgepath 02/27/2018

## 2018-02-27 NOTE — Progress Notes (Signed)
10 instruments 5 big sponges 5 small sponges 2 injectables MO 

## 2018-02-27 NOTE — MAU Note (Signed)
Pt states her water broke this morning, is still leaking, she thinks it is clear fluid.  Noted small amount of blood on toilet tissue.  Having mild contractions.

## 2018-02-27 NOTE — Anesthesia Procedure Notes (Signed)
Epidural Patient location during procedure: OB  Staffing Anesthesiologist: Ramone Gander, MD Performed: anesthesiologist   Preanesthetic Checklist Completed: patient identified, pre-op evaluation, timeout performed, IV checked, risks and benefits discussed and monitors and equipment checked  Epidural Patient position: sitting Prep: site prepped and draped and DuraPrep Patient monitoring: heart rate, continuous pulse ox and blood pressure Approach: midline Location: L3-L4 Injection technique: LOR air and LOR saline  Needle:  Needle type: Tuohy  Needle gauge: 17 G Needle length: 9 cm Needle insertion depth: 7 cm Catheter type: closed end flexible Catheter size: 19 Gauge Catheter at skin depth: 12 cm Test dose: negative  Assessment Sensory level: T8 Events: blood not aspirated, injection not painful, no injection resistance, negative IV test and no paresthesia  Additional Notes Reason for block:procedure for pain     

## 2018-02-27 NOTE — Anesthesia Preprocedure Evaluation (Signed)
Anesthesia Evaluation  Patient identified by MRN, date of birth, ID band Patient awake    Reviewed: Allergy & Precautions, NPO status , Patient's Chart, lab work & pertinent test results  Airway Mallampati: II  TM Distance: >3 FB Neck ROM: Full    Dental no notable dental hx.    Pulmonary neg pulmonary ROS,    Pulmonary exam normal breath sounds clear to auscultation       Cardiovascular negative cardio ROS Normal cardiovascular exam Rhythm:Regular Rate:Normal     Neuro/Psych negative neurological ROS  negative psych ROS   GI/Hepatic negative GI ROS, Neg liver ROS,   Endo/Other  negative endocrine ROS  Renal/GU negative Renal ROS     Musculoskeletal negative musculoskeletal ROS (+)   Abdominal   Peds  Hematology negative hematology ROS (+)   Anesthesia Other Findings   Reproductive/Obstetrics (+) Pregnancy                             Anesthesia Physical Anesthesia Plan  ASA: II  Anesthesia Plan: Epidural   Post-op Pain Management:    Induction:   PONV Risk Score and Plan:   Airway Management Planned:   Additional Equipment:   Intra-op Plan:   Post-operative Plan:   Informed Consent: I have reviewed the patients History and Physical, chart, labs and discussed the procedure including the risks, benefits and alternatives for the proposed anesthesia with the patient or authorized representative who has indicated his/her understanding and acceptance.       Plan Discussed with:   Anesthesia Plan Comments:         Anesthesia Quick Evaluation  

## 2018-02-27 NOTE — Progress Notes (Signed)
Labor Progress Note  Tammie Martinez is a 27 y.o. [name] at 3355w6d  admitted for rupture of membranes  S: Doing well. No concerns.   O:  BP 125/70   Pulse 88   Temp 98.7 F (37.1 C) (Oral)   Resp 16   Ht 5' 4.5" (1.638 m)   Wt 189 lb 1.9 oz (85.8 kg)   LMP 06/24/2017   SpO2 96%   BMI 31.96 kg/m   No intake/output data recorded.  FHT:  FHR: 145 bpm, variability: moderate,  accelerations:  Present,  decelerations:  Absent UC:   irregular, every 3-6 minutes SVE:   Dilation: 1 Effacement (%): 50 Station: -3 Exam by:: Tammie Junes. Goodman, Tammie Martinez  Labs: Lab Results  Component Value Date   WBC 16.2 (H) 02/27/2018   HGB 11.6 (L) 02/27/2018   HCT 35.3 (L) 02/27/2018   MCV 87.8 02/27/2018   PLT 262 02/27/2018    Assessment / Plan: 27 y.o. [name] 8755w6d in early labor. Augmentation of labor, progressing well  Labor: No change s/p SROM. Will augment labor with Pitocin. Fetal Wellbeing:  Category I Pain Control:  Labor support without medications Anticipated MOD:  NSVD  Expectant management   Tammie AdaJazma Avya Flavell, DO OB Fellow [facility], [facility]

## 2018-02-27 NOTE — MAU Provider Note (Addendum)
History     CSN: [id number]  Arrival date and time: 02/27/18 0759   None     Chief Complaint  Patient presents with  . Rupture of Membranes    woke up with fluid on thighs, big gush on commode   HPI [place] Direnzo is a 27 y.o. [id number] at 38w6 who reports to MAU for report of SROM this morning around 0650. Patient reports she woke up with fluid on her legs this morning, which turned into a "big gush" when she used the commode. States fluid with clear with streaks of brown and ran down her leg.  Pad worn to MAU has scant off white fluid. Feeling occasional contractions which she rates as 4/10, unaware of aggravating or relieving factors.  States she has not had sex in the past two weeks  Denies headache, falls, fever, vaginal bleeding. Endorses normal fetal movement.  OB History    Gravida  4   Para  1   Term  1   Preterm      AB  2   Living  1     SAB  2   TAB      Ectopic      Multiple      Live Births  1           History reviewed. No pertinent past medical history.  Past Surgical History:  Procedure Laterality Date  . NO PAST SURGERIES      Family History  Problem Relation Age of Onset  . Heart disease Father   . Hypertension Maternal Grandfather   . COPD Maternal Grandfather     Social History   Tobacco Use  . Smoking status: Never Smoker  . Smokeless tobacco: Never Used  Substance Use Topics  . Alcohol use: No    Alcohol/week: 4.2 oz    Types: 7 Glasses of wine per week    Frequency: Never    Comment: drinks on weekends  . Drug use: No    Types: Marijuana    Comment: prior to pregnancy    Allergies:  Allergies  Allergen Reactions  . Penicillins Hives    Has patient had a PCN reaction causing immediate rash, facial/tongue/throat swelling, SOB or lightheadedness with hypotension: Yes Has patient had a PCN reaction causing severe rash involving mucus membranes or skin necrosis: Yes Has patient had a PCN reaction that required  hospitalization: No Has patient had a PCN reaction occurring within the last 10 years: No If all of the above answers are "NO", then may proceed with Cephalosporin use.   . Sulfa Antibiotics Hives    Medications Prior to Admission  Medication Sig Dispense Refill Last Dose  . acetaminophen (TYLENOL) 500 MG tablet Take 1,000 mg by mouth every 6 (six) hours as needed for mild pain or headache.    Not Taking  . clindamycin (CLEOCIN) 300 MG capsule Take 1 capsule (300 mg total) by mouth 3 (three) times daily. 21 capsule 1 Taking  . cyclobenzaprine (FLEXERIL) 10 MG tablet Take 1 tablet (10 mg total) by mouth 2 (two) times daily as needed for muscle spasms. 20 tablet 0 Taking  . Doxylamine-Pyridoxine (DICLEGIS) 10-10 MG TBEC Take 1 tablet with breakfast and lunch.  Take 2 tablets at bedtime. (Patient not taking: Reported on 01/17/2018) 100 tablet 4 Not Taking  . Elastic Bandages & Supports (COMFORT FIT MATERNITY SUPP SM) MISC Wear as directed. (Patient not taking: Reported on 01/31/2018) 1 each 0 Not Taking  . ferrous  sulfate 325 (65 FE) MG tablet Take 1 tablet (325 mg total) by mouth 2 (two) times daily with a meal. 60 tablet 5 Taking  . ondansetron (ZOFRAN) 8 MG tablet Take 1 tablet (8 mg total) by mouth every 8 (eight) hours as needed for nausea or vomiting. (Patient not taking: Reported on 01/17/2018) 40 tablet 2 Not Taking  . oxyCODONE-acetaminophen (PERCOCET) 10-325 MG tablet Take 1 tablet by mouth every 6 (six) hours as needed for pain. 20 tablet 0 Taking  . Prenatal-Fe Fum-Methf-FA w/o A (VITAFOL-NANO) 18-0.6-0.4 MG TABS Take 1 tablet by mouth at bedtime. 30 tablet 12 Taking  . promethazine (PHENERGAN) 12.5 MG tablet Take 1 tablet (12.5 mg total) by mouth every 6 (six) hours as needed for nausea or vomiting. (Patient not taking: Reported on 01/17/2018) 30 tablet 0 Not Taking  . Vitamin D, Ergocalciferol, (DRISDOL) 50000 units CAPS capsule Take 1 capsule (50,000 Units total) by mouth every 7 (seven)  days. 30 capsule 2 Taking    Review of Systems  Respiratory: Negative for shortness of breath and wheezing.   Gastrointestinal: Negative for abdominal pain, nausea and vomiting.  Genitourinary: Positive for vaginal discharge. Negative for pelvic pain, vaginal bleeding and vaginal pain.  Neurological: Negative for dizziness, weakness and headaches.   Physical Exam   Blood pressure 122/70, pulse 89, temperature 98.1 F (36.7 C), temperature source Oral, resp. rate 17, height 5' 4.5" (1.638 m), weight 185 lb 1.9 oz (84 kg), last menstrual period 06/24/2017, SpO2 96 %.  Physical Exam  Nursing note and vitals reviewed. Constitutional: She is oriented to person, place, and time. She appears well-developed.  Cardiovascular: Normal rate, regular rhythm and intact distal pulses.  Respiratory: Effort normal and breath sounds normal.  GI:  Gravid  Genitourinary: Uterus normal. Vaginal discharge found.  Genitourinary Comments: + pooling Negative ferning Copious white curd-like discharge visible on vaginal walls  Musculoskeletal: Normal range of motion.  Neurological: She is alert and oriented to person, place, and time. She has normal reflexes.  Skin: Skin is warm and dry.  Psychiatric: She has a normal mood and affect. Her behavior is normal. Judgment and thought content normal.    MAU Course  Procedures  MDM Orders Placed This Encounter  Procedures  . OB RESULT CONSOLE Group B Strep    This external order was created through the Results Console.  . Wet prep, genital    Standing Status:   Standing    Number of Occurrences:   1  . OB RESULTS CONSOLE GC/Chlamydia    This external order was created through the Results Console.  Marland Kitchen. Amnisure rupture of membrane (rom)not at [facility]    Standing Status:   Standing    Number of Occurrences:   1  . Contraction - monitoring    Standing Status:   Standing    Number of Occurrences:   1  . External fetal heart monitoring    Standing Status:    Standing    Number of Occurrences:   1  . Vaginal exam    Standing Status:   Standing    Number of Occurrences:   1  . POCT fern test    If suspected rupture of membranes and/or if amniotic fluid leakage is present    Standing Status:   Standing    Number of Occurrences:   1   Reactive NST: Baseline 135, positive accelerations, no decelerations noted Occasional contractions which palpate mild  Assessment and Plan  -27 y.o. [name] at  [redacted]w[redacted]d  -SROM today at 0650 confirmed by positive pooling, positive Amnisure -Reactive NST -1/thick/posterior by RN exam in MAU -Admit to [facility]  Calvert Cantor, CNM 02/27/2018, 8:58 AM

## 2018-02-27 NOTE — Lactation Note (Signed)
This note was copied from a baby's chart. Lactation Consultation Note  Patient Name: Tammie Martinez Today's Date: 02/27/2018 Reason for consult: Initial assessment;Early term 3237-38.6wks  P2 mother whose infant is now 514 hours old.  Mother breastfed her 698 year old for 4 1/2 years.  Infant is swaddled and being held by grandmother as I arrived.  Mother stated that baby last fed 20 minutes ago.  Her left nipple shows signs of irritation already so comfort gels with instructions for use given.  I suggested she allow RN/LC the opportunity to observe the next latch.  Mother verbalized understanding and will call for latch assistance as needed.  Encouraged feeding 8-12 times/24 hours or earlier if baby shows feeding cues.  Reviewed feeding cues with mother.  Continue STS as much as possible to increase milk supply and volume.  Mom made aware of O/P services, breastfeeding support groups, community resources, and our phone # for post-discharge questions. Family present.   Maternal Data Formula Feeding for Exclusion: No Has patient been taught Hand Expression?: Yes Does the patient have breastfeeding experience prior to this delivery?: Yes  Feeding Feeding Type: Breast Fed Length of feed: 25 min  LATCH Score Latch: Grasps breast easily, tongue down, lips flanged, rhythmical sucking.  Audible Swallowing: A few with stimulation  Type of Nipple: Everted at rest and after stimulation  Comfort (Breast/Nipple): Soft / non-tender  Hold (Positioning): Assistance needed to correctly position infant at breast and maintain latch.  LATCH Score: 8  Interventions Interventions: Assisted with latch;Skin to skin;Breast massage;Breast compression;Adjust position;Support pillows  Lactation Tools Discussed/Used     Consult Status Consult Status: Follow-up Date: 02/28/18 Follow-up type: In-patient    Chermaine Schnyder R Daija Routson 02/27/2018, 11:28 PM

## 2018-02-27 NOTE — H&P (Signed)
Obstetric History and Physical  Tammie Martinez is a 27 y.o. [id number] with IUP at [redacted]w[redacted]d presenting for SROM around 0650. Patient states she has been having  Regular mild contractions, none vaginal bleeding, ruptured, clear fluid membranes, with active fetal movement.  No blurry vision, headaches or peripheral edema, and RUQ pain.   Prenatal Course Source of Care: [facility]  Dating: By early [place] --->  Estimated Date of Delivery: 03/07/18 Pregnancy complications or risks: Patient Active Problem List   Diagnosis Date Noted  . Normal labor 02/27/2018  . Migraine 01/15/2018  . Rubella non-immune status, antepartum 09/25/2017  . Vitamin D deficiency 09/25/2017  . Supervision of normal pregnancy, antepartum 09/07/2017  . Missed abortion 02/17/2017   She plans to breastfeed She is undecided for postpartum contraception.   Sono:    @[redacted]w[redacted]d , CWD, normal anatomy, cephalic presentation, anterior placenta, 1924g, 78% EFW  Prenatal labs and studies: ABO, Rh: O/Positive/-- (12/21 1120) Antibody: Negative (12/21 1120) Rubella: <0.90 (12/21 1120) RPR: Non Reactive (04/02 1035)  HBsAg: Negative (12/21 1120)  HIV: Non Reactive (04/02 1035)  AVW:UJWJXBJY (05/15 0000) 2 hr Glucola normal Genetic screening normal Anatomy US normal  Prenatal Transfer Tool  Maternal Diabetes: No Genetic Screening: Normal Maternal Ultrasounds/Referrals: Normal Fetal Ultrasounds or other Referrals:  None Maternal Substance Abuse:  No Significant Maternal Medications:  None Significant Maternal Lab Results: None  History reviewed. No pertinent past medical history.  Past Surgical History:  Procedure Laterality Date  . NO PAST SURGERIES      OB History  Gravida Para Term Preterm AB Living  4 1 1   2 1   SAB TAB Ectopic Multiple Live Births  2       1    # Outcome Date GA Lbr Len/2nd Weight Sex Delivery Anes PTL Lv  4 Current           3 SAB 02/2017 [redacted]w[redacted]d         2 Term 03/17/10 [redacted]w[redacted]d   M Vag-Spont   LIV  1  SAB 2009 [redacted]w[redacted]d       DEC    Social History   Socioeconomic History  . Marital status: Single    Spouse name: Not on file  . Number of children: Not on file  . Years of education: Not on file  . Highest education level: Not on file  Occupational History  . Not on file  Social Needs  . Financial resource strain: Not on file  . Food insecurity:    Worry: Not on file    Inability: Not on file  . Transportation needs:    Medical: Not on file    Non-medical: Not on file  Tobacco Use  . Smoking status: Never Smoker  . Smokeless tobacco: Never Used  Substance and Sexual Activity  . Alcohol use: Not Currently    Alcohol/week: 4.2 oz    Types: 7 Glasses of wine per week    Frequency: Never    Comment: drinks on weekends  . Drug use: No    Types: Marijuana    Comment: prior to pregnancy  . Sexual activity: Yes    Partners: Male    Birth control/protection: None  Lifestyle  . Physical activity:    Days per week: Not on file    Minutes per session: Not on file  . Stress: Not on file  Relationships  . Social connections:    Talks on phone: Not on file    Gets together: Not on file  Attends religious service: Not on file    Active member of club or organization: Not on file    Attends meetings of clubs or organizations: Not on file    Relationship status: Not on file  Other Topics Concern  . Not on file  Social History Narrative  . Not on file    Family History  Problem Relation Age of Onset  . Heart disease Father   . Hypertension Maternal Grandfather   . COPD Maternal Grandfather   . Evelene Croon Parkinson White syndrome Brother     Medications Prior to Admission  Medication Sig Dispense Refill Last Dose  . oxyCODONE-acetaminophen (PERCOCET) 10-325 MG tablet Take 1 tablet by mouth every 6 (six) hours as needed for pain. 20 tablet 0 Past Week at Unknown time  . acetaminophen (TYLENOL) 500 MG tablet Take 1,000 mg by mouth every 6 (six) hours as needed for mild pain or  headache.    02/13/2018  . clindamycin (CLEOCIN) 300 MG capsule Take 1 capsule (300 mg total) by mouth 3 (three) times daily. 21 capsule 1 02/24/2018  . cyclobenzaprine (FLEXERIL) 10 MG tablet Take 1 tablet (10 mg total) by mouth 2 (two) times daily as needed for muscle spasms. 20 tablet 0 02/17/2018  . Doxylamine-Pyridoxine (DICLEGIS) 10-10 MG TBEC Take 1 tablet with breakfast and lunch.  Take 2 tablets at bedtime. (Patient not taking: Reported on 01/17/2018) 100 tablet 4 Not Taking  . Elastic Bandages & Supports (COMFORT FIT MATERNITY SUPP SM) MISC Wear as directed. (Patient not taking: Reported on 01/31/2018) 1 each 0 Not Taking  . ferrous sulfate 325 (65 FE) MG tablet Take 1 tablet (325 mg total) by mouth 2 (two) times daily with a meal. 60 tablet 5 02/25/2018  . Prenatal-Fe Fum-Methf-FA w/o A (VITAFOL-NANO) 18-0.6-0.4 MG TABS Take 1 tablet by mouth at bedtime. 30 tablet 12 02/22/2018  . Vitamin D, Ergocalciferol, (DRISDOL) 50000 units CAPS capsule Take 1 capsule (50,000 Units total) by mouth every 7 (seven) days. 30 capsule 2 02/23/2018    Allergies  Allergen Reactions  . Penicillins Hives    Has patient had a PCN reaction causing immediate rash, facial/tongue/throat swelling, SOB or lightheadedness with hypotension: Yes Has patient had a PCN reaction causing severe rash involving mucus membranes or skin necrosis: Yes Has patient had a PCN reaction that required hospitalization: No Has patient had a PCN reaction occurring within the last 10 years: No If all of the above answers are "NO", then may proceed with Cephalosporin use.   . Sulfa Antibiotics Hives    Review of Systems: Negative except for what is mentioned in HPI.  Physical Exam: BP 122/70   Pulse 89   Temp 98.1 F (36.7 C) (Oral)   Resp 17   Ht 5' 4.5" (1.638 m)   Wt 185 lb 1.9 oz (84 kg)   LMP 06/24/2017   SpO2 96%   BMI 31.29 kg/m  CONSTITUTIONAL: Well-developed, well-nourished female in no acute distress.  HENT:   Normocephalic, atraumatic, External right and left ear normal. Oropharynx is clear and moist EYES: Conjunctivae and EOM are normal. Pupils are equal, round, and reactive to light. No scleral icterus.  NECK: Normal range of motion, supple, no masses SKIN: Skin is warm and dry. No rash noted. Not diaphoretic. No erythema. No pallor. NEUROLOGIC: Alert and oriented to person, place, and time. Normal reflexes, muscle tone coordination. No cranial nerve deficit noted. PSYCHIATRIC: Normal mood and affect. Normal behavior. Normal judgment and thought content. CARDIOVASCULAR: Normal  heart rate noted, regular rhythm RESPIRATORY: Effort and breath sounds normal, no problems with respiration noted ABDOMEN: Soft, nontender, nondistended, gravid. MUSCULOSKELETAL: Normal range of motion. No edema and no tenderness. 2+ distal pulses.  Cervical Exam: Dilation: 1 Effacement (%): 40 Cervical Position: Posterior Station: -3 Presentation: Vertex Exam by:: Leafy RoKatie Koontz, RNC  FHT:  Baseline rate 135 bpm   Variability moderate  Accelerations present   Decelerations none Contractions: Every 1-5 mins, irregular   Pertinent Labs/Studies:   Results for orders placed or performed during the hospital encounter of 02/27/18 (from the past 24 hour(s))  POCT fern test     Status: None   Collection Time: 02/27/18  8:31 AM  Result Value Ref Range   POCT Fern Test Negative = intact amniotic membranes   Wet prep, genital     Status: Abnormal   Collection Time: 02/27/18  8:47 AM  Result Value Ref Range   Yeast Wet Prep HPF POC NONE SEEN NONE SEEN   Trich, Wet Prep NONE SEEN NONE SEEN   Clue Cells Wet Prep HPF POC NONE SEEN NONE SEEN   WBC, Wet Prep HPF POC FEW (A) NONE SEEN   Sperm NONE SEEN   Amnisure [facility]     Status: None   Collection Time: 02/27/18  8:58 AM  Result Value Ref Range   Amnisure ROM POSITIVE   CBC     Status: Abnormal   Collection Time: 02/27/18  9:50 AM  Result  Value Ref Range   WBC 16.2 (H) 4.0 - 10.5 K/uL   RBC 4.02 3.87 - 5.11 MIL/uL   Hemoglobin 11.6 (L) 12.0 - 15.0 g/dL   HCT 16.135.3 (L) 09.636.0 - 04.546.0 %   MCV 87.8 78.0 - 100.0 fL   MCH 28.9 26.0 - 34.0 pg   MCHC 32.9 30.0 - 36.0 g/dL   RDW 40.914.1 81.111.5 - 91.415.5 %   Platelets 262 150 - 400 K/uL    Assessment : [place] Martinez is a 27 y.o. [name] at 1822w6d being admitted for SROM with latent labor.  Plan: Labor: Expectant management. Will reassess in a couple hours to see if she needs augmentation.  Analgesia as needed. FWB: Reassuring fetal heart tracing.   GBS negative Delivery plan: Hopeful for vaginal delivery   Caryl AdaJazma Phelps, DO OB Fellow Faculty Practice, [facility] [place] 02/27/2018, 10:43 AM

## 2018-02-28 LAB — RPR: RPR Ser Ql: NONREACTIVE

## 2018-02-28 MED ORDER — OXYCODONE HCL 5 MG PO TABS
5.0000 mg | ORAL_TABLET | ORAL | Status: DC | PRN
  Administered 2018-02-28: 5 mg via ORAL
  Filled 2018-02-28: qty 1

## 2018-02-28 NOTE — Progress Notes (Signed)
CSW received consult for substance use.  Upon chart review, CSW notes that it is documented in MOB's PNR that marijuana use was "prior to pregnancy."  Therefore, MOB does not meet criteria for automatic CSW consult. 

## 2018-02-28 NOTE — Progress Notes (Signed)
Post Partum Day 1 Subjective: no complaints, up ad lib, voiding and tolerating PO  Objective: Blood pressure (!) 114/53, pulse 71, temperature 98.2 F (36.8 C), temperature source Oral, resp. rate 18, height 5' 4.5" (1.638 m), weight 189 lb 1.9 oz (85.8 kg), last menstrual period 06/24/2017, SpO2 99 %, unknown if currently breastfeeding.  Physical Exam:  General: alert, cooperative and no distress Lochia: appropriate Uterine Fundus: firm Incision: n/a DVT Evaluation: No evidence of DVT seen on physical exam.  Recent Labs    02/27/18 0950  HGB 11.6*  HCT 35.3*    Assessment/Plan: Plan for discharge tomorrow and Breastfeeding   LOS: 1 day   Tammie Martinez 02/28/2018, 6:50 AM

## 2018-02-28 NOTE — Anesthesia Postprocedure Evaluation (Signed)
Anesthesia Post Note  Patient: Tammie Martinez  Procedure(s) Performed: AN AD HOC LABOR EPIDURAL     Patient location during evaluation: Mother Baby Anesthesia Type: Epidural Level of consciousness: awake, awake and alert and oriented Pain management: pain level controlled Vital Signs Assessment: post-procedure vital signs reviewed and stable Respiratory status: spontaneous breathing, nonlabored ventilation and respiratory function stable Cardiovascular status: stable Postop Assessment: no headache, no backache, patient able to bend at knees, no apparent nausea or vomiting, adequate PO intake and able to ambulate Anesthetic complications: no    Last Vitals:  Vitals:   02/28/18 0134 02/28/18 0620  BP: (!) 116/59 (!) 114/53  Pulse: 87 71  Resp: 18 18  Temp: 37.3 C 36.8 C  SpO2:      Last Pain:  Vitals:   02/28/18 0922  TempSrc:   PainSc: 10-Worst pain ever   Pain Goal: Patients Stated Pain Goal: 2 (02/28/18 0620)               Guiselle Mian

## 2018-03-01 ENCOUNTER — Encounter: Payer: Medicaid Other | Admitting: Obstetrics

## 2018-03-01 LAB — GC/CHLAMYDIA PROBE AMP (~~LOC~~) NOT AT ARMC
Chlamydia: NEGATIVE
NEISSERIA GONORRHEA: NEGATIVE

## 2018-03-01 MED ORDER — IBUPROFEN 600 MG PO TABS: 600.0000 mg | ORAL_TABLET | Freq: Four times a day (QID) | ORAL | 0 refills | Status: DC

## 2018-03-01 NOTE — Discharge Instructions (Signed)
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
## 2018-03-19 ENCOUNTER — Other Ambulatory Visit: Payer: Self-pay | Admitting: Obstetrics

## 2018-03-19 ENCOUNTER — Telehealth: Payer: Self-pay

## 2018-03-19 DIAGNOSIS — B379 Candidiasis, unspecified: Secondary | ICD-10-CM

## 2018-03-19 DIAGNOSIS — K5900 Constipation, unspecified: Secondary | ICD-10-CM

## 2018-03-19 MED ORDER — DOCUSATE SODIUM 100 MG PO CAPS: 100.0000 mg | ORAL_CAPSULE | Freq: Two times a day (BID) | ORAL | 2 refills | Status: DC | PRN

## 2018-03-19 MED ORDER — POLYETHYLENE GLYCOL 3350 17 G PO PACK
17.0000 g | PACK | Freq: Every evening | ORAL | 0 refills | Status: DC
Start: 2018-03-19 — End: 2021-08-31

## 2018-03-19 NOTE — Telephone Encounter (Signed)
Pt called requesting a stool softener. She states that she is having trouble having bowel movements. She states that when she is bale to go there is some blood, and burns when she cleans the area. Per Dr. Clearance CootsHarper okay to send miralax for her to take nightly and colace for her to take BID. Rx sent to pharmacy

## 2018-03-19 NOTE — Telephone Encounter (Signed)
Please review for refill.  

## 2018-03-28 ENCOUNTER — Ambulatory Visit (INDEPENDENT_AMBULATORY_CARE_PROVIDER_SITE_OTHER): Payer: Medicaid Other | Admitting: Obstetrics

## 2018-03-28 ENCOUNTER — Encounter: Payer: Self-pay | Admitting: Obstetrics

## 2018-03-28 DIAGNOSIS — Z30011 Encounter for initial prescription of contraceptive pills: Secondary | ICD-10-CM

## 2018-03-28 DIAGNOSIS — Z1389 Encounter for screening for other disorder: Secondary | ICD-10-CM

## 2018-03-28 DIAGNOSIS — K5901 Slow transit constipation: Secondary | ICD-10-CM

## 2018-03-28 DIAGNOSIS — Z3009 Encounter for other general counseling and advice on contraception: Secondary | ICD-10-CM

## 2018-03-28 MED ORDER — DOCUSATE SODIUM 100 MG PO CAPS: 100.0000 mg | ORAL_CAPSULE | Freq: Two times a day (BID) | ORAL | 5 refills | Status: DC

## 2018-03-28 MED ORDER — NORETHINDRONE 0.35 MG PO TABS: 1.0000 | ORAL_TABLET | Freq: Every day | ORAL | 11 refills | Status: DC

## 2018-03-28 NOTE — Progress Notes (Signed)
..  Post Partum Exam  Tammie Martinez is a 27 y.o. [phone] female who presents for a postpartum visit. She is 4 weeks postpartum following a spontaneous vaginal delivery. I have fully reviewed the prenatal and intrapartum course. The delivery was at 38.6 gestational weeks.  Anesthesia: epidural. Postpartum course has been good. Baby's course has been good. Baby is feeding by breast. Bleeding no bleeding. Bowel function is abnormal: constipation. Bladder function is normal. Patient is not sexually active. Contraception method is none. Postpartum depression screening:neg  The following portions of the patient's history were reviewed and updated as appropriate: allergies, current medications, past family history, past medical history, past social history, past surgical history and problem list. Last pap smear done 2018 and was Normal  Review of Systems A comprehensive review of systems was negative.    Objective:  unknown if currently breastfeeding.  General:  alert and no distress   Breasts:  inspection negative, no nipple discharge or bleeding, no masses or nodularity palpable  Lungs: clear to auscultation bilaterally  Heart:  regular rate and rhythm, S1, S2 normal, no murmur, click, rub or gallop  Abdomen: soft, non-tender; bowel sounds normal; no masses,  no organomegaly   Vulva:  normal  Vagina: normal vagina  Cervix:  no cervical motion tenderness  Corpus: normal size, contour, position, consistency, mobility, non-tender  Adnexa:  no mass, fullness, tenderness  Rectal Exam: Not performed.        Assessment:   1. Postpartum care following vaginal delivery - doing well  2. Encounter for other general counseling and advice on contraception - wants OCP's  3. Encounter for initial prescription of contraceptive pills Rx: - norethindrone (MICRONOR,CAMILA,ERRIN) 0.35 MG tablet; Take 1 tablet (0.35 mg total) by mouth daily.  Dispense: 1 Package; Refill: 11  4. Constipation by delayed colonic  transit Rx: - docusate sodium (COLACE) 100 MG capsule; Take 1 capsule (100 mg total) by mouth 2 (two) times daily.  Dispense: 60 capsule; Refill: 5   Plan:   1. Contraception: oral progesterone-only contraceptive 2. Micronor Rx 3. Follow up in: 6 weeks or as needed.    Brock BadHARLES A. HARPER MD 03-28-2018

## 2018-03-29 ENCOUNTER — Encounter: Payer: Self-pay | Admitting: Obstetrics

## 2018-05-09 ENCOUNTER — Ambulatory Visit: Payer: Medicaid Other | Admitting: Obstetrics

## 2018-05-22 ENCOUNTER — Ambulatory Visit: Payer: Medicaid Other | Admitting: Obstetrics

## 2018-05-31 ENCOUNTER — Ambulatory Visit: Payer: Medicaid Other | Admitting: Obstetrics

## 2018-06-18 ENCOUNTER — Ambulatory Visit (INDEPENDENT_AMBULATORY_CARE_PROVIDER_SITE_OTHER): Payer: Medicaid Other | Admitting: Obstetrics

## 2018-06-18 ENCOUNTER — Encounter: Payer: Self-pay | Admitting: Obstetrics

## 2018-06-18 DIAGNOSIS — Z3009 Encounter for other general counseling and advice on contraception: Secondary | ICD-10-CM

## 2018-06-18 DIAGNOSIS — D508 Other iron deficiency anemias: Secondary | ICD-10-CM | POA: Diagnosis not present

## 2018-06-18 MED ORDER — CONCEPT OB 130-92.4-1 MG PO CAPS: 1.0000 | ORAL_CAPSULE | Freq: Every day | ORAL | 11 refills | Status: DC

## 2018-06-18 NOTE — Progress Notes (Signed)
Subjective:     Tammie Martinez is a 27 y.o. female who presents for a postpartum visit. She is 10 weeks postpartum following a spontaneous vaginal delivery. I have fully reviewed the prenatal and intrapartum course. The delivery was at 38.6 gestational weeks. Outcome: spontaneous vaginal delivery. Anesthesia: epidural. Postpartum course has been normal. Baby's course has been normal. Baby is feeding by breast. Bleeding no bleeding. Bowel function is normal. Bladder function is normal. Patient is sexually active. Contraception method is oral progesterone-only contraceptive. Postpartum depression screening: negative.  Tobacco, alcohol and substance abuse history reviewed.  Adult immunizations reviewed including TDAP, rubella and varicella.  The following portions of the patient's history were reviewed and updated as appropriate: allergies, current medications, past family history, past medical history, past social history, past surgical history and problem list.  Review of Systems A comprehensive review of systems was negative.   Objective:    BP 113/72   Pulse 66   Wt 162 lb 14.4 oz (73.9 kg)   BMI 27.96 kg/m   General:  alert and no distress   Breasts:  inspection negative, no nipple discharge or bleeding, no masses or nodularity palpable  Lungs: clear to auscultation bilaterally  Heart:  regular rate and rhythm, S1, S2 normal, no murmur, click, rub or gallop  Abdomen: soft, non-tender; bowel sounds normal; no masses,  no organomegaly   Vulva:  normal  Vagina: normal vagina  Cervix:  no cervical motion tenderness  Corpus: normal size, contour, position, consistency, mobility, non-tender  Adnexa:  no mass, fullness, tenderness  Rectal Exam: Not performed.          50% of 15 min visit spent on counseling and coordination of care.   Assessment:     1. Postpartum care following vaginal delivery Rx: - Prenat w/o A Vit-FeFum-FePo-FA (CONCEPT OB) 130-92.4-1 MG CAPS; Take 1 capsule by mouth  daily.  Dispense: 30 capsule; Refill: 11  2. Encounter for other general counseling and advice on contraception - taking oral progestin only contraceptive ( Breast Feeding )  3. Iron deficiency anemia secondary to inadequate dietary iron intake Rx: - CBC - Ferritin  4. Encounter for lactation counseling - doing well.  Plans to breast feed for a year  Plan:    1. Contraception: oral progesterone-only contraceptive 2. Continue PNV's 3. Follow up in: 3 months or as needed.     Brock Bad MD 06-18-2018

## 2018-06-18 NOTE — Progress Notes (Signed)
Pt c/o lower pelvic pain no dysuria.   Not currently taking Birth Control pills.   Wants to Discuss anemia and possible labs

## 2018-06-19 LAB — CBC
Hematocrit: 35.4 % (ref 34.0–46.6)
Hemoglobin: 11.4 g/dL (ref 11.1–15.9)
MCH: 27.1 pg (ref 26.6–33.0)
MCHC: 32.2 g/dL (ref 31.5–35.7)
MCV: 84 fL (ref 79–97)
PLATELETS: 324 10*3/uL (ref 150–450)
RBC: 4.2 x10E6/uL (ref 3.77–5.28)
RDW: 13.1 % (ref 12.3–15.4)
WBC: 6.1 10*3/uL (ref 3.4–10.8)

## 2018-06-19 LAB — FERRITIN: FERRITIN: 87 ng/mL (ref 15–150)

## 2018-06-26 ENCOUNTER — Telehealth: Payer: Self-pay

## 2018-06-26 NOTE — Telephone Encounter (Signed)
Pt called and asked if she could have Plan B prescribed to her. I advised pt that we do not prescribe that and she will have to get it OTC at a drug store. Pt verbalized understanding.

## 2018-08-03 ENCOUNTER — Other Ambulatory Visit: Payer: Self-pay | Admitting: Obstetrics

## 2018-08-03 DIAGNOSIS — K029 Dental caries, unspecified: Secondary | ICD-10-CM

## 2019-04-09 DIAGNOSIS — H16223 Keratoconjunctivitis sicca, not specified as Sjogren's, bilateral: Secondary | ICD-10-CM | POA: Diagnosis not present

## 2019-04-09 DIAGNOSIS — H40033 Anatomical narrow angle, bilateral: Secondary | ICD-10-CM | POA: Diagnosis not present

## 2019-04-10 ENCOUNTER — Telehealth: Payer: Self-pay

## 2019-04-10 NOTE — Telephone Encounter (Signed)
Returned call, pt stated that she is having back pain where she received her epidural on 02-27-18, advised that she will need to follow up with PCP, pt agreed.

## 2019-05-16 ENCOUNTER — Ambulatory Visit: Payer: Medicaid Other

## 2019-05-16 ENCOUNTER — Other Ambulatory Visit: Payer: Self-pay

## 2019-05-16 DIAGNOSIS — B379 Candidiasis, unspecified: Secondary | ICD-10-CM

## 2019-05-17 MED ORDER — FLUCONAZOLE 150 MG PO TABS: 150.0000 mg | ORAL_TABLET | Freq: Once | ORAL | 2 refills | Status: AC

## 2019-05-17 NOTE — Telephone Encounter (Signed)
Refill request    fluconazole (DIFLUCAN) 150 MG tablet       Sig: N/A   Disp:  1 tablet  Refills:  0   Start: 05/16/2019   Class: Normal   Non-formulary For: Yeast infection   Last ordered: 1 year ago by Shelly Bombard, MD      To be filled at: [facility], [place]

## 2019-05-17 NOTE — Telephone Encounter (Signed)
fluconazole (DIFLUCAN) 150 MG tablet       Sig: N/A   Disp:  1 tablet  Refills:  0   Start: 05/16/2019   Class: Normal   Non-formulary For: Yeast infection   Last ordered: 1 year ago by Shelly Bombard, MD      To be filled at: [facility], [place]

## 2019-05-18 DIAGNOSIS — G44209 Tension-type headache, unspecified, not intractable: Secondary | ICD-10-CM | POA: Diagnosis not present

## 2019-06-02 DIAGNOSIS — H5213 Myopia, bilateral: Secondary | ICD-10-CM | POA: Diagnosis not present

## 2019-06-27 ENCOUNTER — Telehealth: Payer: Self-pay

## 2019-06-27 DIAGNOSIS — B9689 Other specified bacterial agents as the cause of diseases classified elsewhere: Secondary | ICD-10-CM

## 2019-06-27 DIAGNOSIS — B379 Candidiasis, unspecified: Secondary | ICD-10-CM

## 2019-06-27 MED ORDER — METRONIDAZOLE 500 MG PO TABS: 500.0000 mg | ORAL_TABLET | Freq: Two times a day (BID) | ORAL | 0 refills | Status: DC

## 2019-06-27 MED ORDER — FLUCONAZOLE 150 MG PO TABS: 150.0000 mg | ORAL_TABLET | Freq: Once | ORAL | 3 refills | Status: AC

## 2019-06-27 NOTE — Telephone Encounter (Signed)
Pt called requesting rx for flagyl. She states that she is having vaginal discharge with odor and irritation. She states that her sx started yesterday. Per protocol flagyl sent to the pharmacy. Pt is also requesting rx for diflucan just in case she gets a yeast infection.

## 2019-07-23 ENCOUNTER — Ambulatory Visit: Payer: Medicaid Other | Admitting: Obstetrics

## 2019-10-15 ENCOUNTER — Other Ambulatory Visit: Payer: Self-pay

## 2019-10-15 DIAGNOSIS — B9689 Other specified bacterial agents as the cause of diseases classified elsewhere: Secondary | ICD-10-CM

## 2019-10-15 DIAGNOSIS — N760 Acute vaginitis: Secondary | ICD-10-CM

## 2019-10-16 ENCOUNTER — Other Ambulatory Visit: Payer: Self-pay

## 2019-10-16 DIAGNOSIS — B9689 Other specified bacterial agents as the cause of diseases classified elsewhere: Secondary | ICD-10-CM

## 2019-10-16 DIAGNOSIS — N760 Acute vaginitis: Secondary | ICD-10-CM

## 2019-10-16 MED ORDER — METRONIDAZOLE 500 MG PO TABS: 500.0000 mg | ORAL_TABLET | Freq: Two times a day (BID) | ORAL | 0 refills | Status: DC

## 2019-10-16 NOTE — Progress Notes (Signed)
Rx sent per protocol  For BV.

## 2019-11-07 ENCOUNTER — Ambulatory Visit: Payer: Medicaid Other | Admitting: Obstetrics

## 2019-11-11 ENCOUNTER — Ambulatory Visit: Payer: Medicaid Other | Admitting: Nurse Practitioner

## 2020-03-10 ENCOUNTER — Ambulatory Visit: Payer: Medicaid Other | Admitting: Obstetrics

## 2020-03-10 ENCOUNTER — Other Ambulatory Visit: Payer: Self-pay

## 2020-03-10 ENCOUNTER — Encounter: Payer: Self-pay | Admitting: Obstetrics

## 2020-03-10 ENCOUNTER — Other Ambulatory Visit (HOSPITAL_COMMUNITY)
Admission: RE | Admit: 2020-03-10 | Discharge: 2020-03-10 | Disposition: A | Discharge status: Home / Self Care | Payer: Medicaid Other | Source: Ambulatory Visit | Attending: Obstetrics | Admitting: Obstetrics

## 2020-03-10 VITALS — BP 103/55 | HR 60 | Ht 64.5 in | Wt 143.0 lb

## 2020-03-10 DIAGNOSIS — Z113 Encounter for screening for infections with a predominantly sexual mode of transmission: Secondary | ICD-10-CM

## 2020-03-10 DIAGNOSIS — Z01419 Encounter for gynecological examination (general) (routine) without abnormal findings: Secondary | ICD-10-CM | POA: Insufficient documentation

## 2020-03-10 DIAGNOSIS — F321 Major depressive disorder, single episode, moderate: Secondary | ICD-10-CM | POA: Diagnosis not present

## 2020-03-10 DIAGNOSIS — N898 Other specified noninflammatory disorders of vagina: Secondary | ICD-10-CM

## 2020-03-10 MED ORDER — PRENATE MINI 29-0.6-0.4-350 MG PO CAPS: 1.0000 | ORAL_CAPSULE | Freq: Every day | ORAL | 3 refills | Status: DC

## 2020-03-10 NOTE — Progress Notes (Signed)
Pt is in the office for annual. Last pap 09-08-17 Pt states she is not on [facility] and does not want any. Pt reports that she has an ingrown hair and possible hemmrhoids. PHQ-9=24
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
## 2020-03-11 LAB — CERVICOVAGINAL ANCILLARY ONLY
Bacterial Vaginitis (gardnerella): NEGATIVE
Candida Glabrata: NEGATIVE
Candida Vaginitis: POSITIVE — AB
Chlamydia: NEGATIVE
Comment: NEGATIVE
Comment: NEGATIVE
Comment: NEGATIVE
Comment: NEGATIVE
Comment: NEGATIVE
Comment: NORMAL
Neisseria Gonorrhea: NEGATIVE
Trichomonas: NEGATIVE

## 2020-03-11 LAB — HEPATITIS B SURFACE ANTIGEN: Hepatitis B Surface Ag: NEGATIVE

## 2020-03-11 LAB — HIV ANTIBODY (ROUTINE TESTING W REFLEX): HIV Screen 4th Generation wRfx: NONREACTIVE

## 2020-03-11 LAB — HEPATITIS C ANTIBODY: Hep C Virus Ab: <0.1 s/co ratio (ref 0.0–0.9)

## 2020-03-11 LAB — RPR: RPR Ser Ql: NONREACTIVE

## 2020-03-12 ENCOUNTER — Other Ambulatory Visit: Payer: Self-pay | Admitting: Obstetrics

## 2020-03-12 DIAGNOSIS — B373 Candidiasis of vulva and vagina: Secondary | ICD-10-CM

## 2020-03-12 DIAGNOSIS — B3731 Acute candidiasis of vulva and vagina: Secondary | ICD-10-CM

## 2020-03-12 LAB — CYTOLOGY - PAP
Comment: NEGATIVE
Diagnosis: NEGATIVE
High risk HPV: NEGATIVE

## 2020-03-12 MED ORDER — FLUCONAZOLE 150 MG PO TABS: 150.0000 mg | ORAL_TABLET | Freq: Once | ORAL | 0 refills | Status: AC

## 2020-03-30 ENCOUNTER — Other Ambulatory Visit: Payer: Self-pay

## 2020-03-30 DIAGNOSIS — B9689 Other specified bacterial agents as the cause of diseases classified elsewhere: Secondary | ICD-10-CM

## 2020-03-30 DIAGNOSIS — N760 Acute vaginitis: Secondary | ICD-10-CM

## 2020-03-30 MED ORDER — METRONIDAZOLE 500 MG PO TABS: 500.0000 mg | ORAL_TABLET | Freq: Two times a day (BID) | ORAL | 0 refills | Status: DC

## 2020-03-30 NOTE — Progress Notes (Signed)
Pt left detailed message c/o BV  Rx sent per protocol.

## 2020-04-07 ENCOUNTER — Telehealth: Payer: Self-pay | Admitting: Obstetrics

## 2020-04-07 ENCOUNTER — Ambulatory Visit: Payer: Medicaid Other | Admitting: Licensed Clinical Social Worker

## 2020-04-07 ENCOUNTER — Telehealth: Payer: Self-pay | Admitting: Licensed Clinical Social Worker

## 2020-04-07 DIAGNOSIS — Z91199 Patient's noncompliance with other medical treatment and regimen due to unspecified reason: Secondary | ICD-10-CM

## 2020-04-07 DIAGNOSIS — Z5329 Procedure and treatment not carried out because of patient's decision for other reasons: Secondary | ICD-10-CM

## 2020-04-07 NOTE — Telephone Encounter (Signed)
Patient requested to have appt canceled. Tammie Martinez was offered opportunity to reschedule appt. Patient declined.

## 2020-04-08 NOTE — BH Specialist Note (Signed)
No show appt

## 2020-08-06 ENCOUNTER — Ambulatory Visit: Payer: Medicaid Other | Admitting: Obstetrics

## 2020-08-18 ENCOUNTER — Ambulatory Visit: Payer: Medicaid Other | Admitting: Obstetrics

## 2020-08-25 ENCOUNTER — Other Ambulatory Visit: Payer: Self-pay

## 2020-08-25 DIAGNOSIS — N760 Acute vaginitis: Secondary | ICD-10-CM

## 2020-08-25 DIAGNOSIS — B9689 Other specified bacterial agents as the cause of diseases classified elsewhere: Secondary | ICD-10-CM

## 2020-09-08 ENCOUNTER — Other Ambulatory Visit: Payer: Self-pay | Admitting: *Deleted

## 2020-09-08 DIAGNOSIS — B9689 Other specified bacterial agents as the cause of diseases classified elsewhere: Secondary | ICD-10-CM

## 2020-09-08 MED ORDER — FLUCONAZOLE 150 MG PO TABS
150.0000 mg | ORAL_TABLET | Freq: Once | ORAL | 0 refills | Status: AC
Start: 2020-09-08 — End: 2020-09-08

## 2020-09-08 MED ORDER — METRONIDAZOLE 500 MG PO TABS: 500.0000 mg | ORAL_TABLET | Freq: Two times a day (BID) | ORAL | 0 refills | Status: DC

## 2020-09-08 NOTE — Progress Notes (Signed)
Pt called to office stating symptoms of BV and yeast.  Flagyl and Diflucan sent today per protocol.

## 2020-12-05 ENCOUNTER — Other Ambulatory Visit: Payer: Self-pay

## 2020-12-05 DIAGNOSIS — N760 Acute vaginitis: Secondary | ICD-10-CM

## 2020-12-05 DIAGNOSIS — B9689 Other specified bacterial agents as the cause of diseases classified elsewhere: Secondary | ICD-10-CM

## 2020-12-07 ENCOUNTER — Other Ambulatory Visit: Payer: Self-pay

## 2020-12-07 MED ORDER — METRONIDAZOLE 500 MG PO TABS
500.0000 mg | ORAL_TABLET | Freq: Two times a day (BID) | ORAL | 0 refills | Status: DC
Start: 2020-12-07 — End: 2021-03-25

## 2020-12-07 MED ORDER — FLUCONAZOLE 150 MG PO TABS
150.0000 mg | ORAL_TABLET | Freq: Once | ORAL | 0 refills | Status: AC
Start: 2020-12-07 — End: 2020-12-07

## 2020-12-07 NOTE — Progress Notes (Signed)
Pt states she is experiencing malodorous vaginal discharge and vaginal irritation.  Flagyl and Diflucan sent to the pharmacy per protocol. Pt aware to contact the office for an appt if sx's do not subside after treatment.

## 2021-02-23 ENCOUNTER — Telehealth: Payer: Self-pay

## 2021-02-23 MED ORDER — METRONIDAZOLE 500 MG PO TABS: 500.0000 mg | ORAL_TABLET | Freq: Two times a day (BID) | ORAL | 0 refills | Status: DC

## 2021-02-23 MED ORDER — FLUCONAZOLE 150 MG PO TABS: 150.0000 mg | ORAL_TABLET | Freq: Once | ORAL | 0 refills | Status: AC

## 2021-02-23 NOTE — Telephone Encounter (Signed)
Patient thinks she has BV and is requesting flagyl and diflucan. Will treat per protocol.

## 2021-03-12 ENCOUNTER — Telehealth: Payer: Medicaid Other | Admitting: Physician Assistant

## 2021-03-12 ENCOUNTER — Encounter: Payer: Self-pay | Admitting: Physician Assistant

## 2021-03-12 DIAGNOSIS — B379 Candidiasis, unspecified: Secondary | ICD-10-CM

## 2021-03-12 MED ORDER — FLUCONAZOLE 150 MG PO TABS: 150.0000 mg | ORAL_TABLET | Freq: Once | ORAL | 0 refills | Status: AC

## 2021-03-12 NOTE — Progress Notes (Signed)
[name], Tammie Martinez are scheduled for a virtual visit with your provider today.    Just as we do with appointments in the office, we must obtain your consent to participate.  Your consent will be active for this visit and any virtual visit you may have with one of our providers in the next 365 days.    If you have a MyChart account, I can also send a copy of this consent to you electronically.  All virtual visits are billed to your insurance company just like a traditional visit in the office.  As this is a virtual visit, video technology does not allow for your provider to perform a traditional examination.  This may limit your provider's ability to fully assess your condition.  If your provider identifies any concerns that need to be evaluated in person or the need to arrange testing such as labs, EKG, etc, we will make arrangements to do so.    Although advances in technology are sophisticated, we cannot ensure that it will always work on either your end or our end.  If the connection with a video visit is poor, we may have to switch to a telephone visit.  With either a video or telephone visit, we are not always able to ensure that we have a secure connection.   I need to obtain your verbal consent now.   Are you willing to proceed with your visit today?   Tammie Martinez has provided verbal consent on 03/12/2021 for a virtual visit (video or telephone).   Tammie Loveless, PA-C 03/12/2021  9:59 AM  Virtual Visit Consent   Tammie Martinez, you are scheduled for a virtual visit with a [facility] provider today.     Just as with appointments in the office, your consent must be obtained to participate.  Your consent will be active for this visit and any virtual visit you may have with one of our providers in the next 365 days.     If you have a MyChart account, a copy of this consent can be sent to you electronically.  All virtual visits are billed to your insurance company just like a traditional visit in the  office.    As this is a virtual visit, video technology does not allow for your provider to perform a traditional examination.  This may limit your provider's ability to fully assess your condition.  If your provider identifies any concerns that need to be evaluated in person or the need to arrange testing (such as labs, EKG, etc.), we will make arrangements to do so.     Although advances in technology are sophisticated, we cannot ensure that it will always work on either your end or our end.  If the connection with a video visit is poor, the visit may have to be switched to a telephone visit.  With either a video or telephone visit, we are not always able to ensure that we have a secure connection.     I need to obtain your verbal consent now.   Are you willing to proceed with your visit today?    Tammie Martinez has provided verbal consent on 03/12/2021 for a virtual visit (video or telephone).   Tammie Loveless, PA-C   Date: 03/12/2021 9:59 AM   Virtual Visit via Video Note   Tammie Martinez, connected [email or web address] ([id number], 26-Dec-1990) on 03/12/21 at  9:15 AM EDT by a video-enabled telemedicine application and verified that I am speaking with the  correct person using two identifiers.  Location: Patient: Virtual Visit Location Patient: Home Provider: Virtual Visit Location Provider: Home Office   I discussed the limitations of evaluation and management by telemedicine and the availability of in person appointments. The patient expressed understanding and agreed to proceed.    History of Present Illness: Tammie Martinez is a 30 y.o. who identifies as a female who was assigned female at birth, and is being seen today for Vaginal discharge.  HPI: Vaginal Discharge The patient's primary symptoms include genital itching and vaginal discharge. The patient's pertinent negatives include no genital odor or genital rash. The current episode started in the past 7 days. The problem occurs  constantly. The problem has been unchanged. The patient is experiencing no pain. The problem affects both sides. She is not pregnant. Pertinent negatives include no chills, dysuria, fever, flank pain, nausea or rash. The vaginal discharge was thick and white. There has been no bleeding.  Had recent antibiotic.   Problems:  Patient Active Problem List   Diagnosis Date Noted   Migraine 01/15/2018   Vitamin D deficiency 09/25/2017   Foreign body in ear 02/09/2016    Allergies:  Allergies  Allergen Reactions   Penicillins Hives    Has patient had a PCN reaction causing immediate rash, facial/tongue/throat swelling, SOB or lightheadedness with hypotension: Yes Has patient had a PCN reaction causing severe rash involving mucus membranes or skin necrosis: Yes Has patient had a PCN reaction that required hospitalization: No Has patient had a PCN reaction occurring within the last 10 years: No If all of the above answers are "NO", then may proceed with Cephalosporin use.    Sulfa Antibiotics Hives   Medications:  Current Outpatient Medications:    fluconazole (DIFLUCAN) 150 MG tablet, Take 1 tablet (150 mg total) by mouth once for 1 dose. May repeat in 72 hours if needed, Disp: 2 tablet, Rfl: 0   acetaminophen (TYLENOL) 500 MG tablet, Take 1,000 mg by mouth every 6 (six) hours as needed for mild pain or headache.  (Patient not taking: Reported on 03/10/2020), Disp: , Rfl:    cyclobenzaprine (FLEXERIL) 10 MG tablet, Take 1 tablet (10 mg total) by mouth 2 (two) times daily as needed for muscle spasms. (Patient not taking: Reported on 03/28/2018), Disp: 20 tablet, Rfl: 0   docusate sodium (COLACE) 100 MG capsule, Take 1 capsule (100 mg total) by mouth 2 (two) times daily as needed. (Patient not taking: Reported on 03/28/2018), Disp: 30 capsule, Rfl: 2   docusate sodium (COLACE) 100 MG capsule, Take 1 capsule (100 mg total) by mouth 2 (two) times daily. (Patient not taking: Reported on 06/18/2018), Disp:  60 capsule, Rfl: 5   Doxylamine-Pyridoxine (DICLEGIS) 10-10 MG TBEC, Take 1 tablet with breakfast and lunch.  Take 2 tablets at bedtime. (Patient not taking: Reported on 01/17/2018), Disp: 100 tablet, Rfl: 4   Elastic Bandages & Supports (COMFORT FIT MATERNITY SUPP SM) MISC, Wear as directed. (Patient not taking: Reported on 01/31/2018), Disp: 1 each, Rfl: 0   ferrous sulfate 325 (65 FE) MG tablet, Take 1 tablet (325 mg total) by mouth 2 (two) times daily with a meal. (Patient not taking: Reported on 06/18/2018), Disp: 60 tablet, Rfl: 5   ibuprofen (ADVIL,MOTRIN) 600 MG tablet, Take 1 tablet (600 mg total) by mouth every 6 (six) hours. (Patient not taking: Reported on 03/10/2020), Disp: 30 tablet, Rfl: 0   metroNIDAZOLE (FLAGYL) 500 MG tablet, Take 1 tablet (500 mg total) by mouth 2 (two)  times daily., Disp: 14 tablet, Rfl: 0   metroNIDAZOLE (FLAGYL) 500 MG tablet, Take 1 tablet (500 mg total) by mouth 2 (two) times daily., Disp: 14 tablet, Rfl: 0   metroNIDAZOLE (FLAGYL) 500 MG tablet, Take 1 tablet (500 mg total) by mouth 2 (two) times daily., Disp: 14 tablet, Rfl: 0   norethindrone (MICRONOR,CAMILA,ERRIN) 0.35 MG tablet, Take 1 tablet (0.35 mg total) by mouth daily. (Patient not taking: Reported on 06/18/2018), Disp: 1 Package, Rfl: 11   polyethylene glycol (MIRALAX) packet, Take 17 g by mouth Nightly. (Patient not taking: Reported on 06/18/2018), Disp: 14 each, Rfl: 0   Prenat w/o A Vit-FeFum-FePo-FA (CONCEPT OB) 130-92.4-1 MG CAPS, Take 1 capsule by mouth daily. (Patient not taking: Reported on 03/10/2020), Disp: 30 capsule, Rfl: 11   Prenat w/o A-FeCbn-Meth-FA-DHA (PRENATE MINI) 29-0.6-0.4-350 MG CAPS, Take 1 capsule by mouth daily before breakfast., Disp: 90 capsule, Rfl: 3   Prenatal-Fe Fum-Methf-FA w/o A (VITAFOL-NANO) 18-0.6-0.4 MG TABS, Take 1 tablet by mouth at bedtime. (Patient not taking: Reported on 03/28/2018), Disp: 30 tablet, Rfl: 12   Vitamin D, Ergocalciferol, (DRISDOL) 50000 units CAPS  capsule, Take 1 capsule (50,000 Units total) by mouth every 7 (seven) days. (Patient not taking: Reported on 03/10/2020), Disp: 30 capsule, Rfl: 2  Observations/Objective: Patient is well-developed, well-nourished in no acute distress.  Resting comfortably at home.  Head is normocephalic, atraumatic.  No labored breathing. No adventitious breath sounds heard through video Speech is clear and coherent with logical content.  Patient is alert and oriented at baseline.   Assessment and Plan: 1. Yeast infection - fluconazole (DIFLUCAN) 150 MG tablet; Take 1 tablet (150 mg total) by mouth once for 1 dose. May repeat in 72 hours if needed  Dispense: 2 tablet; Refill: 0 - Had recent antibiotic use (metronidazole) - Now with itching and mild thick, white discharge; suspect yeast infection - Diflucan prescribed as above - Seek in-person evaluation if not improving or worsening  Follow Up Instructions: I discussed the assessment and treatment plan with the patient. The patient was provided an opportunity to ask questions and all were answered. The patient agreed with the plan and demonstrated an understanding of the instructions.  A copy of instructions were sent to the patient via MyChart.  The patient was advised to call back or seek an in-person evaluation if the symptoms worsen or if the condition fails to improve as anticipated.  Time:  I spent 10 minutes with the patient via telehealth technology discussing the above problems/concerns.    Tammie Loveless, PA-C

## 2021-03-12 NOTE — Patient Instructions (Signed)
Vaginal Yeast Infection, Adult  Vaginal yeast infection is a condition that causes vaginal discharge as well as soreness, swelling, and redness (inflammation) of the vagina. This is a common condition. Some women get this infectionfrequently. What are the causes? This condition is caused by a change in the normal balance of the yeast (candida) and bacteria that live in the vagina. This change causes an overgrowth ofyeast, which causes the inflammation. What increases the risk? The condition is more likely to develop in women who: Take antibiotic medicines. Have diabetes. Take birth control pills. Are pregnant. Douche often. Have a weak body defense system (immune system). Have been taking steroid medicines for a long time. Frequently wear tight clothing. What are the signs or symptoms? Symptoms of this condition include: White, thick, creamy vaginal discharge. Swelling, itching, redness, and irritation of the vagina. The lips of the vagina (vulva) may be affected as well. Pain or a burning feeling while urinating. Pain during sex. How is this diagnosed? This condition is diagnosed based on: Your medical history. A physical exam. A pelvic exam. Your health care provider will examine a sample of your vaginal discharge under a microscope. Your health care provider may send this sample for testing to confirm the diagnosis. How is this treated? This condition is treated with medicine. Medicines may be over-the-counter or prescription. You may be told to use one or more of the following: Medicine that is taken by mouth (orally). Medicine that is applied as a cream (topically). Medicine that is inserted directly into the vagina (suppository). Follow these instructions at home:  Lifestyle Do not have sex until your health care provider approves. Tell your sex partner that you have a yeast infection. That person should go to his or her health care provider and ask if they should also be  treated. Do not wear tight clothes, such as pantyhose or tight pants. Wear breathable cotton underwear. General instructions Take or apply over-the-counter and prescription medicines only as told by your health care provider. Eat more yogurt. This may help to keep your yeast infection from returning. Do not use tampons until your health care provider approves. Try taking a sitz bath to help with discomfort. This is a warm water bath that is taken while you are sitting down. The water should only come up to your hips and should cover your buttocks. Do this 3-4 times per day or as told by your health care provider. Do not douche. If you have diabetes, keep your blood sugar levels under control. Keep all follow-up visits as told by your health care provider. This is important. Contact a health care provider if: You have a fever. Your symptoms go away and then return. Your symptoms do not get better with treatment. Your symptoms get worse. You have new symptoms. You develop blisters in or around your vagina. You have blood coming from your vagina and it is not your menstrual period. You develop pain in your abdomen. Summary Vaginal yeast infection is a condition that causes discharge as well as soreness, swelling, and redness (inflammation) of the vagina. This condition is treated with medicine. Medicines may be over-the-counter or prescription. Take or apply over-the-counter and prescription medicines only as told by your health care provider. Do not douche. Do not have sex or use tampons until your health care provider approves. Contact a health care provider if your symptoms do not get better with treatment or your symptoms go away and then return. This information is not intended to replace   advice given to you by your health care provider. Make sure you discuss any questions you have with your healthcare provider. Document Revised: 08/26/2020 Document Reviewed: 01/22/2018 Elsevier Patient  Education  2022 [employer]  

## 2021-03-25 ENCOUNTER — Encounter: Payer: Self-pay | Admitting: Obstetrics

## 2021-03-25 ENCOUNTER — Ambulatory Visit (INDEPENDENT_AMBULATORY_CARE_PROVIDER_SITE_OTHER): Payer: Medicaid Other | Admitting: Obstetrics

## 2021-03-25 ENCOUNTER — Other Ambulatory Visit (HOSPITAL_COMMUNITY)
Admission: RE | Admit: 2021-03-25 | Discharge: 2021-03-25 | Disposition: A | Discharge status: Home / Self Care | Payer: Medicaid Other | Source: Ambulatory Visit | Attending: Obstetrics | Admitting: Obstetrics

## 2021-03-25 ENCOUNTER — Other Ambulatory Visit: Payer: Self-pay

## 2021-03-25 VITALS — BP 111/71 | HR 61 | Ht 64.5 in | Wt 129.7 lb

## 2021-03-25 DIAGNOSIS — Z01419 Encounter for gynecological examination (general) (routine) without abnormal findings: Secondary | ICD-10-CM

## 2021-03-25 DIAGNOSIS — N898 Other specified noninflammatory disorders of vagina: Secondary | ICD-10-CM | POA: Diagnosis not present

## 2021-03-25 DIAGNOSIS — F32A Depression, unspecified: Secondary | ICD-10-CM

## 2021-03-25 DIAGNOSIS — F419 Anxiety disorder, unspecified: Secondary | ICD-10-CM

## 2021-03-25 DIAGNOSIS — K5901 Slow transit constipation: Secondary | ICD-10-CM | POA: Diagnosis not present

## 2021-03-25 DIAGNOSIS — Z113 Encounter for screening for infections with a predominantly sexual mode of transmission: Secondary | ICD-10-CM

## 2021-03-25 DIAGNOSIS — D508 Other iron deficiency anemias: Secondary | ICD-10-CM

## 2021-03-25 DIAGNOSIS — R52 Pain, unspecified: Secondary | ICD-10-CM

## 2021-03-25 DIAGNOSIS — L089 Local infection of the skin and subcutaneous tissue, unspecified: Secondary | ICD-10-CM

## 2021-03-25 MED ORDER — CLINDAMYCIN HCL 300 MG PO CAPS: 300.0000 mg | ORAL_CAPSULE | Freq: Three times a day (TID) | ORAL | 0 refills | Status: DC

## 2021-03-25 MED ORDER — VITAFOL ULTRA 29-0.6-0.4-200 MG PO CAPS: 1.0000 | ORAL_CAPSULE | Freq: Every day | ORAL | 4 refills | Status: DC

## 2021-03-25 MED ORDER — FERROUS SULFATE 325 (65 FE) MG PO TABS: 325.0000 mg | ORAL_TABLET | ORAL | 5 refills | Status: DC

## 2021-03-25 MED ORDER — DOCUSATE SODIUM 100 MG PO CAPS: 100.0000 mg | ORAL_CAPSULE | Freq: Two times a day (BID) | ORAL | 11 refills | Status: DC

## 2021-03-25 MED ORDER — IBUPROFEN 800 MG PO TABS: 800.0000 mg | ORAL_TABLET | Freq: Three times a day (TID) | ORAL | 5 refills | Status: DC | PRN

## 2021-03-25 MED ORDER — ESCITALOPRAM OXALATE 10 MG PO TABS: 10.0000 mg | ORAL_TABLET | Freq: Every day | ORAL | 11 refills | Status: DC

## 2021-03-25 NOTE — Progress Notes (Signed)
Pt presents for annual, pap, STD's. Pt c/o blister on L nipple.
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
## 2021-03-26 LAB — CERVICOVAGINAL ANCILLARY ONLY
Bacterial Vaginitis (gardnerella): POSITIVE — AB
Candida Glabrata: NEGATIVE
Candida Vaginitis: POSITIVE — AB
Chlamydia: NEGATIVE
Comment: NEGATIVE
Comment: NEGATIVE
Comment: NEGATIVE
Comment: NEGATIVE
Comment: NEGATIVE
Comment: NORMAL
Neisseria Gonorrhea: NEGATIVE
Trichomonas: NEGATIVE

## 2021-03-26 LAB — HEPATITIS B SURFACE ANTIGEN: Hepatitis B Surface Ag: NEGATIVE

## 2021-03-26 LAB — HEPATITIS C ANTIBODY: Hep C Virus Ab: <0.1 s/co ratio (ref 0.0–0.9)

## 2021-03-26 LAB — RPR: RPR Ser Ql: NONREACTIVE

## 2021-03-26 LAB — HIV ANTIBODY (ROUTINE TESTING W REFLEX): HIV Screen 4th Generation wRfx: NONREACTIVE

## 2021-03-29 ENCOUNTER — Telehealth: Payer: Self-pay

## 2021-03-29 ENCOUNTER — Other Ambulatory Visit: Payer: Self-pay | Admitting: Obstetrics

## 2021-03-29 DIAGNOSIS — B373 Candidiasis of vulva and vagina: Secondary | ICD-10-CM

## 2021-03-29 DIAGNOSIS — B3731 Acute candidiasis of vulva and vagina: Secondary | ICD-10-CM

## 2021-03-29 MED ORDER — FLUCONAZOLE 150 MG PO TABS: 150.0000 mg | ORAL_TABLET | Freq: Once | ORAL | 0 refills | Status: DC

## 2021-03-29 NOTE — Telephone Encounter (Signed)
-----   Message from Brock Bad, MD sent at 03/29/2021  1:01 PM EDT ----- Clindamycin Rx for BV Diflucan Rx for yeast

## 2021-03-29 NOTE — Telephone Encounter (Signed)
Patient reviewed test results online. 

## 2021-04-01 LAB — CYTOLOGY - PAP
Comment: NEGATIVE
Diagnosis: NEGATIVE
High risk HPV: NEGATIVE

## 2021-04-06 ENCOUNTER — Encounter: Payer: Medicaid Other | Admitting: Licensed Clinical Social Worker

## 2021-04-06 ENCOUNTER — Telehealth: Payer: Self-pay | Admitting: Licensed Clinical Social Worker

## 2021-04-06 NOTE — Telephone Encounter (Signed)
Called pt regarding scheduled mychart visit. Tammie Martinez requested to have appt rescheduled.

## 2021-04-13 ENCOUNTER — Encounter: Payer: Medicaid Other | Admitting: Licensed Clinical Social Worker

## 2021-04-27 ENCOUNTER — Other Ambulatory Visit: Payer: Self-pay

## 2021-04-27 DIAGNOSIS — N898 Other specified noninflammatory disorders of vagina: Secondary | ICD-10-CM

## 2021-04-27 MED ORDER — FLUCONAZOLE 200 MG PO TABS: 200.0000 mg | ORAL_TABLET | Freq: Every day | ORAL | 0 refills | Status: DC

## 2021-04-27 NOTE — Progress Notes (Signed)
Pt states she never received Diflucan pill that was sent on 03/29/21  Rx sent for diflucan approved by Dr.Harper. Pt made aware .

## 2021-05-29 ENCOUNTER — Other Ambulatory Visit: Payer: Self-pay

## 2021-05-29 DIAGNOSIS — N898 Other specified noninflammatory disorders of vagina: Secondary | ICD-10-CM

## 2021-05-31 ENCOUNTER — Telehealth: Payer: Medicaid Other | Admitting: Physician Assistant

## 2021-05-31 ENCOUNTER — Other Ambulatory Visit: Payer: Self-pay

## 2021-05-31 DIAGNOSIS — N760 Acute vaginitis: Secondary | ICD-10-CM | POA: Diagnosis not present

## 2021-05-31 DIAGNOSIS — N898 Other specified noninflammatory disorders of vagina: Secondary | ICD-10-CM

## 2021-05-31 MED ORDER — FLUCONAZOLE 200 MG PO TABS: 200.0000 mg | ORAL_TABLET | Freq: Every day | ORAL | 0 refills | Status: DC

## 2021-06-01 ENCOUNTER — Encounter: Payer: Self-pay | Admitting: *Deleted

## 2021-06-01 ENCOUNTER — Telehealth: Payer: Medicaid Other

## 2021-06-01 MED ORDER — METRONIDAZOLE 500 MG PO TABS
500.0000 mg | ORAL_TABLET | Freq: Two times a day (BID) | ORAL | 0 refills | Status: DC
Start: 2021-06-01 — End: 2021-09-30

## 2021-06-01 MED ORDER — FLUCONAZOLE 200 MG PO TABS: 200.0000 mg | ORAL_TABLET | Freq: Every day | ORAL | 0 refills | Status: DC

## 2021-06-01 NOTE — Telephone Encounter (Signed)
MyChart message sent notifying patient of Diflucan refill sent.

## 2021-06-01 NOTE — Progress Notes (Addendum)
Called patient and got updated information. Symptoms concerning for BV. Will treat as such.  E-Visit for Vaginal Symptoms  We are sorry that you are not feeling well. Here is how we plan to help! Based on what you shared with me it looks like you: May have a vaginosis due to bacteria  Vaginosis is an inflammation of the vagina that can result in discharge, itching and pain. The cause is usually a change in the normal balance of vaginal bacteria or an infection. Vaginosis can also result from reduced estrogen levels after menopause.  The most common causes of vaginosis are:   Bacterial vaginosis which results from an overgrowth of one on several organisms that are normally present in your vagina.   Yeast infections which are caused by a naturally occurring fungus called candida.   Vaginal atrophy (atrophic vaginosis) which results from the thinning of the vagina from reduced estrogen levels after menopause.   Trichomoniasis which is caused by a parasite and is commonly transmitted by sexual intercourse.  Factors that increase your risk of developing vaginosis include: Medications, such as antibiotics and steroids Uncontrolled diabetes Use of hygiene products such as bubble bath, vaginal spray or vaginal deodorant Douching Wearing damp or tight-fitting clothing Using an intrauterine device (IUD) for birth control Hormonal changes, such as those associated with pregnancy, birth control pills or menopause Sexual activity Having a sexually transmitted infection  Your treatment plan is Metronidazole or Flagyl 500mg  twice a day for 7 days.  I have electronically sent this prescription into the pharmacy that you have chosen.  Be sure to take all of the medication as directed. Stop taking any medication if you develop a rash, tongue swelling or shortness of breath. Mothers who are breast feeding should consider pumping and discarding their breast milk while on these antibiotics. However, there  is no consensus that infant exposure at these doses would be harmful.  Remember that medication creams can weaken latex condoms.   HOME CARE:  Good hygiene may prevent some types of vaginosis from recurring and may relieve some symptoms:  Avoid baths, hot tubs and whirlpool spas. Rinse soap from your outer genital area after a shower, and dry the area well to prevent irritation. Don't use scented or harsh soaps, such as those with deodorant or antibacterial action. Avoid irritants. These include scented tampons and pads. Wipe from front to back after using the toilet. Doing so avoids spreading fecal bacteria to your vagina.  Other things that may help prevent vaginosis include:  Don't douche. Your vagina doesn't require cleansing other than normal bathing. Repetitive douching disrupts the normal organisms that reside in the vagina and can actually increase your risk of vaginal infection. Douching won't clear up a vaginal infection. Use a latex condom. Both female and female latex condoms may help you avoid infections spread by sexual contact. Wear cotton underwear. Also wear pantyhose with a cotton crotch. If you feel comfortable without it, skip wearing underwear to bed. Yeast thrives in [place] Your symptoms should improve in the next day or two.  GET HELP RIGHT AWAY IF:  You have pain in your lower abdomen ( pelvic area or over your ovaries) You develop nausea or vomiting You develop a fever Your discharge changes or worsens You have persistent pain with intercourse You develop shortness of breath, a rapid pulse, or you faint.  These symptoms could be signs of problems or infections that need to be evaluated by a medical provider now.  MAKE  SURE YOU   Understand these instructions. Will watch your condition. Will get help right away if you are not doing well or get worse.  Thank you for choosing an e-visit.  Your e-visit answers were reviewed by a board certified  advanced clinical practitioner to complete your personal care plan. Depending upon the condition, your plan could have included both over the counter or prescription medications.  Please review your pharmacy choice. Make sure the pharmacy is open so you can pick up prescription now. If there is a problem, you may contact your provider through [employer] and have the prescription routed to another pharmacy.  Your safety is important to [place]. If you have drug allergies check your prescription carefully.   For the next 24 hours you can use MyChart to ask questions about today's visit, request a non-urgent call back, or ask for a work or school excuse. You will get an email in the next two days asking about your experience. I hope that your e-visit has been valuable and will speed your recovery.

## 2021-06-01 NOTE — Addendum Note (Signed)
Addended by: Waldon Merl on: 06/01/2021 08:21 AM   Modules accepted: Orders

## 2021-06-01 NOTE — Progress Notes (Signed)
I have spent 5 minutes in review of e-visit questionnaire, review and updating patient chart, medical decision making and response to patient.   Adorian Gwynne Cody Mikailah Morel, PA-C    

## 2021-07-21 DIAGNOSIS — H5213 Myopia, bilateral: Secondary | ICD-10-CM | POA: Diagnosis not present

## 2021-08-27 ENCOUNTER — Other Ambulatory Visit: Payer: Self-pay | Admitting: Obstetrics

## 2021-08-27 DIAGNOSIS — N898 Other specified noninflammatory disorders of vagina: Secondary | ICD-10-CM

## 2021-08-28 ENCOUNTER — Other Ambulatory Visit: Payer: Self-pay | Admitting: Obstetrics

## 2021-08-28 DIAGNOSIS — B3731 Acute candidiasis of vulva and vagina: Secondary | ICD-10-CM

## 2021-08-29 ENCOUNTER — Other Ambulatory Visit: Payer: Self-pay | Admitting: Obstetrics

## 2021-08-29 DIAGNOSIS — B3731 Acute candidiasis of vulva and vagina: Secondary | ICD-10-CM

## 2021-08-30 ENCOUNTER — Other Ambulatory Visit: Payer: Self-pay | Admitting: Obstetrics

## 2021-08-30 DIAGNOSIS — B3731 Acute candidiasis of vulva and vagina: Secondary | ICD-10-CM

## 2021-08-30 MED ORDER — FLUCONAZOLE 200 MG PO TABS: 200.0000 mg | ORAL_TABLET | Freq: Every day | ORAL | 0 refills | Status: DC

## 2021-08-31 ENCOUNTER — Ambulatory Visit: Payer: Medicaid Other | Admitting: Obstetrics

## 2021-08-31 ENCOUNTER — Ambulatory Visit: Payer: Medicaid Other | Admitting: Obstetrics & Gynecology

## 2021-08-31 ENCOUNTER — Encounter: Payer: Self-pay | Admitting: Obstetrics

## 2021-08-31 ENCOUNTER — Other Ambulatory Visit: Payer: Self-pay

## 2021-08-31 ENCOUNTER — Other Ambulatory Visit (HOSPITAL_COMMUNITY)
Admission: RE | Admit: 2021-08-31 | Discharge: 2021-08-31 | Disposition: A | Discharge status: Home / Self Care | Payer: Medicaid Other | Source: Ambulatory Visit | Attending: Obstetrics | Admitting: Obstetrics

## 2021-08-31 VITALS — BP 103/67 | HR 67 | Ht 64.5 in | Wt 138.0 lb

## 2021-08-31 DIAGNOSIS — K5901 Slow transit constipation: Secondary | ICD-10-CM

## 2021-08-31 DIAGNOSIS — Z113 Encounter for screening for infections with a predominantly sexual mode of transmission: Secondary | ICD-10-CM

## 2021-08-31 DIAGNOSIS — Z3009 Encounter for other general counseling and advice on contraception: Secondary | ICD-10-CM

## 2021-08-31 DIAGNOSIS — Z0000 Encounter for general adult medical examination without abnormal findings: Secondary | ICD-10-CM

## 2021-08-31 DIAGNOSIS — N61 Mastitis without abscess: Secondary | ICD-10-CM | POA: Diagnosis not present

## 2021-08-31 DIAGNOSIS — O039 Complete or unspecified spontaneous abortion without complication: Secondary | ICD-10-CM | POA: Diagnosis not present

## 2021-08-31 DIAGNOSIS — N898 Other specified noninflammatory disorders of vagina: Secondary | ICD-10-CM

## 2021-08-31 DIAGNOSIS — Z30011 Encounter for initial prescription of contraceptive pills: Secondary | ICD-10-CM | POA: Diagnosis not present

## 2021-08-31 DIAGNOSIS — R102 Pelvic and perineal pain unspecified side: Secondary | ICD-10-CM

## 2021-08-31 DIAGNOSIS — D508 Other iron deficiency anemias: Secondary | ICD-10-CM

## 2021-08-31 LAB — POCT URINE PREGNANCY: Preg Test, Ur: POSITIVE — AB

## 2021-08-31 MED ORDER — SLYND 4 MG PO TABS: 1.0000 | ORAL_TABLET | Freq: Every day | ORAL | 11 refills | Status: DC

## 2021-08-31 MED ORDER — DOCUSATE SODIUM 100 MG PO CAPS
100.0000 mg | ORAL_CAPSULE | Freq: Two times a day (BID) | ORAL | 11 refills | Status: DC
Start: 2021-08-31 — End: 2021-09-30

## 2021-08-31 MED ORDER — POLYETHYLENE GLYCOL 3350 17 G PO PACK: 17.0000 g | PACK | Freq: Every evening | ORAL | 11 refills | Status: DC

## 2021-08-31 MED ORDER — OXYCODONE-ACETAMINOPHEN 5-325 MG PO TABS: 1.0000 | ORAL_TABLET | ORAL | 0 refills | Status: DC | PRN

## 2021-08-31 MED ORDER — METRONIDAZOLE 500 MG PO TABS: 500.0000 mg | ORAL_TABLET | Freq: Two times a day (BID) | ORAL | 2 refills | Status: DC

## 2021-08-31 MED ORDER — DOXYCYCLINE HYCLATE 100 MG PO CAPS: 100.0000 mg | ORAL_CAPSULE | Freq: Two times a day (BID) | ORAL | 0 refills | Status: DC

## 2021-08-31 MED ORDER — VITAFOL ULTRA 29-0.6-0.4-200 MG PO CAPS: 1.0000 | ORAL_CAPSULE | Freq: Every day | ORAL | 4 refills | Status: DC

## 2021-08-31 NOTE — Progress Notes (Signed)
GYN presents for miscarriage 08/08/21, still bleeding, discharge, odor, cramping, dizzy spells.

## 2021-08-31 NOTE — Progress Notes (Signed)
Patient ID: Tammie Martinez, female   DOB: Jan 08, 1991, 30 y.o.   MRN: [id number]  Chief Complaint  Patient presents with   Miscarriage    HPI Tammie Martinez is a 30 y.o. female.  History of SAB on 08-09-2021, complete.  Now having cramping and vaginal bleeding.  Denies dysuria.  Also c/o malodorous vaginal discharge. HPI  No past medical history on file.  Past Surgical History:  Procedure Laterality Date   NO PAST SURGERIES      Family History  Problem Relation Age of Onset   Heart disease Father    Hypertension Maternal Grandfather    COPD Maternal Grandfather    Evelene Croon Parkinson White syndrome Brother     Social History Social History   Tobacco Use   Smoking status: Never   Smokeless tobacco: Never  Substance Use Topics   Alcohol use: Yes    Alcohol/week: 7.0 standard drinks    Types: 7 Glasses of wine per week    Comment: drinks on weekends   Drug use: Yes    Types: Marijuana    Comment: prior to pregnancy    Allergies  Allergen Reactions   Penicillins Hives    Has patient had a PCN reaction causing immediate rash, facial/tongue/throat swelling, SOB or lightheadedness with hypotension: Yes Has patient had a PCN reaction causing severe rash involving mucus membranes or skin necrosis: Yes Has patient had a PCN reaction that required hospitalization: No Has patient had a PCN reaction occurring within the last 10 years: No If all of the above answers are "NO", then may proceed with Cephalosporin use.    Sulfa Antibiotics Hives    Current Outpatient Medications  Medication Sig Dispense Refill   doxycycline (VIBRAMYCIN) 100 MG capsule Take 1 capsule (100 mg total) by mouth 2 (two) times daily. 14 capsule 0   Drospirenone (SLYND) 4 MG TABS Take 1 tablet by mouth daily before breakfast. 28 tablet 11   ibuprofen (ADVIL) 800 MG tablet Take 1 tablet (800 mg total) by mouth every 8 (eight) hours as needed. 30 tablet 5   metroNIDAZOLE (FLAGYL) 500 MG tablet Take 1  tablet (500 mg total) by mouth 2 (two) times daily. 14 tablet 2   oxyCODONE-acetaminophen (PERCOCET/ROXICET) 5-325 MG tablet Take 1 tablet by mouth every 4 (four) hours as needed for severe pain. 20 tablet 0   acetaminophen (TYLENOL) 500 MG tablet Take 1,000 mg by mouth every 6 (six) hours as needed for mild pain or headache. (Patient not taking: Reported on 08/31/2021)     cyclobenzaprine (FLEXERIL) 10 MG tablet Take 1 tablet (10 mg total) by mouth 2 (two) times daily as needed for muscle spasms. (Patient not taking: Reported on 08/31/2021) 20 tablet 0   docusate sodium (COLACE) 100 MG capsule Take 1 capsule (100 mg total) by mouth 2 (two) times daily as needed. (Patient not taking: Reported on 08/31/2021) 30 capsule 2   docusate sodium (COLACE) 100 MG capsule Take 1 capsule (100 mg total) by mouth 2 (two) times daily. (Patient not taking: Reported on 08/31/2021) 60 capsule 5   docusate sodium (COLACE) 100 MG capsule Take 1 capsule (100 mg total) by mouth 2 (two) times daily. 60 capsule 11   Doxylamine-Pyridoxine (DICLEGIS) 10-10 MG TBEC Take 1 tablet with breakfast and lunch.  Take 2 tablets at bedtime. (Patient not taking: Reported on 08/31/2021) 100 tablet 4   Elastic Bandages & Supports (COMFORT FIT MATERNITY SUPP SM) MISC Wear as directed. (Patient not taking: Reported on 08/31/2021) 1  each 0   escitalopram (LEXAPRO) 10 MG tablet Take 1 tablet (10 mg total) by mouth daily. (Patient not taking: Reported on 08/31/2021) 30 tablet 11   ferrous sulfate 325 (65 FE) MG tablet Take 1 tablet (325 mg total) by mouth 2 (two) times daily with a meal. (Patient not taking: Reported on 08/31/2021) 60 tablet 5   ferrous sulfate 325 (65 FE) MG tablet Take 1 tablet (325 mg total) by mouth every other day. (Patient not taking: Reported on 08/31/2021) 60 tablet 5   fluconazole (DIFLUCAN) 200 MG tablet Take 1 tablet (200 mg total) by mouth daily. Take 1 tablet by mouth every 3 days. (Patient not taking:  Reported on 08/31/2021) 1 tablet 0   metroNIDAZOLE (FLAGYL) 500 MG tablet Take 1 tablet (500 mg total) by mouth 2 (two) times daily. (Patient not taking: Reported on 08/31/2021) 14 tablet 0   norethindrone (MICRONOR,CAMILA,ERRIN) 0.35 MG tablet Take 1 tablet (0.35 mg total) by mouth daily. (Patient not taking: Reported on 08/31/2021) 1 Package 11   polyethylene glycol (MIRALAX) 17 g packet Take 17 g by mouth Nightly. 14 each 11   Prenat-Fe Poly-Methfol-FA-DHA (VITAFOL ULTRA) 29-0.6-0.4-200 MG CAPS Take 1 capsule by mouth daily before breakfast. 90 capsule 4   Vitamin D, Ergocalciferol, (DRISDOL) 50000 units CAPS capsule Take 1 capsule (50,000 Units total) by mouth every 7 (seven) days. (Patient not taking: Reported on 08/31/2021) 30 capsule 2   No current facility-administered medications for this visit.    Review of Systems Review of Systems Constitutional: negative for fatigue and weight loss Respiratory: negative for cough and wheezing Cardiovascular: negative for chest pain, fatigue and palpitations Gastrointestinal: negative for abdominal pain and change in bowel habits Genitourinary:positive for malodorous vaginal discharge and pelvic pain Integument/breast: possible  for left nipple pain and swelling where nipple piercing located  Musculoskeletal:negative for myalgias Neurological: negative for gait problems and tremors Behavioral/Psych: negative for abusive relationship, depression Endocrine: negative for temperature intolerance      Blood pressure 103/67, pulse 67, height 5' 4.5" (1.638 m), weight 138 lb (62.6 kg), unknown if currently breastfeeding.  Physical Exam Physical Exam General:   Alert and no distress  Skin:   no rash or abnormalities  Lungs:   clear to auscultation bilaterally  Heart:   regular rate and rhythm, S1, S2 normal, no murmur, click, rub or gallop  Breasts:    Left Nipple:  tender, inflamed  Abdomen:  normal findings: no organomegaly, soft,  non-tender and no hernia  Pelvis:  External genitalia: normal general appearance Urinary system: urethral meatus normal and bladder without fullness, nontender Vaginal: normal without tenderness, induration or masses Cervix: normal appearance Adnexa: normal bimanual exam Uterus: anteverted and non-tender, normal size    I have spent a total of 20 minutes of face-to-face time, excluding clinical staff time, reviewing notes and preparing to see patient, ordering tests and/or medications, and counseling the patient.   Data Reviewed Wet Prep UPT:  Faintly positive.  Will draw Quant. Beta HCG  Assessment   1. SAB, complete Rx: - POCT urine pregnancy - POCT urinalysis dipstick - Beta hCG quant (ref lab)  2. Vaginal discharge, malodorous.  Possible PID. Rx: - Cervicovaginal ancillary only( [place]) - doxycycline (VIBRAMYCIN) 100 MG capsule; Take 1 capsule (100 mg total) by mouth 2 (two) times daily.  Dispense: 14 capsule; Refill: 0 - metroNIDAZOLE (FLAGYL) 500 MG tablet; Take 1 tablet (500 mg total) by mouth 2 (two) times daily.  Dispense: 14 tablet; Refill: 2  3. Pelvic pain Rx: - oxyCODONE-acetaminophen (PERCOCET/ROXICET) 5-325 MG tablet; Take 1 tablet by mouth every 4 (four) hours as needed for severe pain.  Dispense: 20 tablet; Refill: 0  4. Screen for STD (sexually transmitted disease) Rx: - HIV Antibody (routine testing w rflx) - Hepatitis B surface antigen - RPR - Hepatitis C antibody - Urine Culture  5. Nipple infection Rx: - doxycycline (VIBRAMYCIN) 100 MG capsule; Take 1 capsule (100 mg total) by mouth 2 (two) times daily.  Dispense: 14 capsule; Refill: 0  6. Iron deficiency anemia secondary to inadequate dietary iron intake Rx: - CBC - Ferritin - Prenat-Fe Poly-Methfol-FA-DHA (VITAFOL ULTRA) 29-0.6-0.4-200 MG CAPS; Take 1 capsule by mouth daily before breakfast.  Dispense: 90 capsule; Refill: 4  7. Encounter for other general counseling and advice on  contraception - wants OCP's - has tobacco dependency.  Slynd Rx and cessation with medication recommended.  8. Encounter for initial prescription of contraceptive pills Rx: - Drospirenone (SLYND) 4 MG TABS; Take 1 tablet by mouth daily before breakfast.  Dispense: 28 tablet; Refill: 11  9. 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  Dispense: 14 each; Refill: 11 - docusate sodium (COLACE) 100 MG capsule; Take 1 capsule (100 mg total) by mouth 2 (two) times daily.  Dispense: 60 capsule; Refill: 11  10. Routine adult health maintenance Rx: - Ambulatory referral to Internal Medicine    Plan   Follow up in 2 weeks virtually.  Orders Placed This Encounter  Procedures   Urine Culture   HIV Antibody (routine testing w rflx)   Hepatitis B surface antigen   RPR   Hepatitis C antibody   CBC   Ferritin   Beta hCG quant (ref lab)   Ambulatory referral to Internal Medicine    Referral Priority:   Routine    Referral Type:   Consultation    Referral Reason:   Specialty Services Required    Requested Specialty:   Internal Medicine    Number of Visits Requested:   1   POCT urine pregnancy   POCT urinalysis dipstick      Brock Bad, MD 08/31/2021 12:40 PM

## 2021-09-01 LAB — CBC
Hematocrit: 36.7 % (ref 34.0–46.6)
Hemoglobin: 11.6 g/dL (ref 11.1–15.9)
MCH: 27.7 pg (ref 26.6–33.0)
MCHC: 31.6 g/dL (ref 31.5–35.7)
MCV: 88 fL (ref 79–97)
Platelets: 338 10*3/uL (ref 150–450)
RBC: 4.19 x10E6/uL (ref 3.77–5.28)
RDW: 11.7 % (ref 11.7–15.4)
WBC: 6.5 10*3/uL (ref 3.4–10.8)

## 2021-09-01 LAB — RPR: RPR Ser Ql: NONREACTIVE

## 2021-09-01 LAB — CERVICOVAGINAL ANCILLARY ONLY
Bacterial Vaginitis (gardnerella): POSITIVE — AB
Candida Glabrata: NEGATIVE
Candida Vaginitis: NEGATIVE
Chlamydia: NEGATIVE
Comment: NEGATIVE
Comment: NEGATIVE
Comment: NEGATIVE
Comment: NEGATIVE
Comment: NEGATIVE
Comment: NORMAL
Neisseria Gonorrhea: NEGATIVE
Trichomonas: NEGATIVE

## 2021-09-01 LAB — HEPATITIS B SURFACE ANTIGEN: Hepatitis B Surface Ag: NEGATIVE

## 2021-09-01 LAB — FERRITIN: Ferritin: 79 ng/mL (ref 15–150)

## 2021-09-01 LAB — HEPATITIS C ANTIBODY: Hep C Virus Ab: <0.1 s/co ratio (ref 0.0–0.9)

## 2021-09-01 LAB — HIV ANTIBODY (ROUTINE TESTING W REFLEX): HIV Screen 4th Generation wRfx: NONREACTIVE

## 2021-09-02 ENCOUNTER — Other Ambulatory Visit: Payer: Self-pay | Admitting: Obstetrics

## 2021-09-02 DIAGNOSIS — N760 Acute vaginitis: Secondary | ICD-10-CM

## 2021-09-03 LAB — URINE CULTURE: Organism ID, Bacteria: NO GROWTH

## 2021-09-15 ENCOUNTER — Telehealth: Payer: Medicaid Other | Admitting: Nurse Practitioner

## 2021-09-15 ENCOUNTER — Encounter: Payer: Self-pay | Admitting: Nurse Practitioner

## 2021-09-15 ENCOUNTER — Other Ambulatory Visit: Payer: Self-pay

## 2021-09-15 DIAGNOSIS — O039 Complete or unspecified spontaneous abortion without complication: Secondary | ICD-10-CM

## 2021-09-15 NOTE — Progress Notes (Signed)
° ° °  GYNECOLOGY VIRTUAL VISIT ENCOUNTER NOTE  Provider location: [facility]   Patient location: Home  I connected with Tammie Martinez on 09/15/21 at  1:50 PM EST by MyChart Video Encounter and verified that I am speaking with the correct person using two identifiers.   I discussed the limitations, risks, security and privacy concerns of performing an evaluation and management service virtually and the availability of in person appointments. I also discussed with the patient that there may be a patient responsible charge related to this service. The patient expressed understanding and agreed to proceed.   History:  Tammie Martinez is a 30 y.o. [phone] female being evaluated today for continued vaginal bleeding after miscarriage on 08-09-21.  She did not receive medical care for the miscarriage as she had one before and this one felt similar.  But she has continued to have vaginal bleeding and abdominal pain.  She denies any abnormal vaginal discharge, or other concerns.    She saw Dr. Clearance Coots on 09-02-21 and still had a positive pregnancy test at that visit.  Was considered to be likely due to decreasing hormone levels from miscarriage in November.     No past medical history on file. Past Surgical History:  Procedure Laterality Date   NO PAST SURGERIES     The following portions of the patient's history were reviewed and updated as appropriate: allergies, current medications, past family history, past medical history, past social history, past surgical history and problem list.   Health Maintenance:  Normal pap and negative HRHPV on 03-25-21.    Review of Systems:  Pertinent items noted in HPI and remainder of comprehensive ROS otherwise negative.  Physical Exam:   General:  Alert, oriented and cooperative. Patient appears to be in no acute distress.  Mental Status: Normal mood and affect. Normal behavior. Normal judgment and thought content.   Respiratory: Normal  respiratory effort, no problems with respiration noted  Rest of physical exam deferred due to type of encounter    Assessment and Plan:     1. Miscarriage Continues to have bleeding since miscarriage she had at home on Nov. 20 Had positive pregnancy test at last visit and was thought it was too soon for quant to be zero. Continues to have lower abdominal pain and said she had oxycodone prescribed as ibuprofen does not work.   Want to investigate reason for pain rather than continuing to prescribe Oxycodone. Will come for [facility] tomorrow and if zero, will use POPs for 2-3 months to stablize bleeding.  - Beta hCG quant (ref lab); Future       I discussed the assessment and treatment plan with the patient. The patient was provided an opportunity to ask questions and all were answered. The patient agreed with the plan and demonstrated an understanding of the instructions.   The patient was advised to call back or seek an in-person evaluation/go to the ED if the symptoms worsen or if the condition fails to improve as anticipated.  I provided 8 minutes of face-to-face time during this encounter.   Currie Paris, NP [facility] for [employer], [facility]

## 2021-09-16 ENCOUNTER — Other Ambulatory Visit: Payer: Medicaid Other

## 2021-09-19 NOTE — L&D Delivery Note (Signed)
OB/GYN Faculty Practice Delivery Note  Tammie Martinez is a 31 y.o. [id number] s/p SVD at [redacted]w[redacted]d. She was admitted for SOL.   ROM: 3h 20m with clear fluid GBS Status: Neg Maximum Maternal Temperature: 98.3  Labor Progress: Pt came in latent labor but progressed into active phase without augmentation. Got her epidural then AROM and progressed to complete/+2 without further assistance.  Delivery Date/Time: 08/22/22 at 0335 Delivery: Called to room and patient was complete and pushing. Head delivered LOA. No nuchal cord present. Shoulder and body delivered in usual fashion. Infant with spontaneous cry, placed on mother's abdomen, dried and stimulated. Parents desired extended delayed cord clamping (until transition to postpartum room) so cord left attached. No cord blood drawn. Placenta delivered spontaneously, intact, with 3-vessel cord. Fundus firm with massage and Pitocin. Labia, perineum, vagina, and cervix inspected, first degree perineal laceration found and repaired with 3.0 vicryl.   Placenta: intact, spontaneous, to be taken home before transition to postpartum unit Complications: None Lacerations: 1st degree perineal EBL: 22ml Analgesia: epidural  Postpartum Planning [x]  transfer orders to MB [x]  discharge summary started & shared [x]  message to sent to schedule follow-up  [x]  lists updated [x]  vaccines UTD  Infant: Boy (yes)  APGARs 9/9  [id number]  , CNM, IBCLC Certified Nurse Midwife, [facility] for , [facility] 08/23/2022, 3:25 AM

## 2021-09-30 ENCOUNTER — Encounter: Payer: Self-pay | Admitting: Internal Medicine

## 2021-09-30 ENCOUNTER — Ambulatory Visit: Payer: Medicaid Other | Admitting: Internal Medicine

## 2021-09-30 ENCOUNTER — Other Ambulatory Visit: Payer: Self-pay

## 2021-09-30 VITALS — BP 120/55 | HR 62 | Temp 98.3°F | Ht 64.0 in | Wt 136.3 lb

## 2021-09-30 DIAGNOSIS — N649 Disorder of breast, unspecified: Secondary | ICD-10-CM

## 2021-09-30 DIAGNOSIS — O039 Complete or unspecified spontaneous abortion without complication: Secondary | ICD-10-CM

## 2021-09-30 DIAGNOSIS — E559 Vitamin D deficiency, unspecified: Secondary | ICD-10-CM | POA: Diagnosis not present

## 2021-09-30 NOTE — Progress Notes (Signed)
° °  CC: nipple lesion, recent miscarriage  HPI:  [name] is a 31 y.o. with a recent miscarriage presenting to establish care and discuss a left nipple lesion. For details of today's visit and the status of his chronic medical issues please refer to the assessment and plan.  No past medical history on file.  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  She has 2 children age 34 and 33.  She currently vapes every day and has been for the past 7 months.  She also drinks alcohol daily, at least 1 shot of liquor or 1 mixed drink daily.  She has been drinking this much for the past 1 year.  She also endorses frequent marijuana use up to a couple times a day.  No other illicit drug use.  Review of Systems:   Review of Systems  Constitutional:  Negative for chills, fever and malaise/fatigue.  Respiratory:  Negative for shortness of breath.   Cardiovascular:  Positive for chest pain. Negative for palpitations and leg swelling.  Gastrointestinal:  Negative for abdominal pain, nausea and vomiting.  Genitourinary:  Negative for hematuria.       Vaginal pain, intermittent spotting and heavy bleeding    Physical Exam:  Vitals:   09/30/21 1411  BP: (!) 120/55  Pulse: 62  Temp: 98.3 F (36.8 C)  TempSrc: Oral  SpO2: 100%  Weight: 136 lb 4.8 oz (61.8 kg)  Height: 5\' 4"  (1.626 m)   Physical Exam General: alert, appears stated age, in no acute distress HEENT: Normocephalic, atraumatic, EOM intact, conjunctiva normal CV: Regular rate and rhythm, no murmurs rubs or gallops Pulm: Clear to auscultation bilaterally, normal work of breathing Abdomen: Soft, nondistended, bowel sounds present, no tenderness to palpation MSK: No lower extremity  edema Skin: Warm and dry, left nipple papule tender to palpation, white coloration, Neuro: Alert and oriented x3   Assessment & Plan:   See Encounters Tab for problem based charting.  Patient seen with Dr.  Saverio Danker

## 2021-09-30 NOTE — Patient Instructions (Signed)
I want you to schedule an appointment with your OB/GYN as soon as possible even if it is 1 of Dr. Verdell Carmine colleagues.  For your breast lesion I want you to try warm compresses up to 3 times a day.  If after 1 to 2 weeks it is still not improved please give me a call.

## 2021-09-30 NOTE — Assessment & Plan Note (Signed)
Patient recently had a spontaneous abortion 08/08/2021.  Patient has been following with OB/GYN regarding this.  She continued to have abdominal/vaginal pain with spotting and intermittent heavy bleeding.  She was last seen mid December at that time her pregnancy test was still positive and beta hCG was still increased which was expected per OB/GYN notes.  They recommended repeating these tests due to her continued bleeding and pain.  Patient unfortunately missed her last appointment on 12/29 and has not been able to follow-up since.  Recommended we do a pelvic exam today however patient had to leave for a conference.  We were able to obtain a beta-hCG, will follow.  Recommended she schedule a follow-up appointment with her OB/GYN as soon as possible and if not she can return our clinic sooner for pelvic exam.  -Follow-up beta-hCG

## 2021-09-30 NOTE — Assessment & Plan Note (Signed)
Patient reports left nipple lesion for the past year.  She states that she had a left nipple piercing about 3 years ago which was initially infected and so she removed the piercing.  Her infection was resolved after treatment however over the past year she has had about 3 infections of her left nipple requiring antibiotic treatment.  She is most recently treated in December for nipple infection by her OB/GYN.  States that she has completed the treatment but still having left nipple lesion tenderness and left breast/chest pain for the past year.  She has tried warm compresses with minimal relief.  Denies any fevers or chills.  On exam, there is a small papular lesion of the left nipple which is white in color.  Tender to palpation.  No other bumps or lumps appreciated.  No overlying skin erythema or changes.  No lymphadenopathy.  Assessment/plan: Patient does not have any systemic signs of infection.  On exam lesion appears clean and dry and not infected.  No fluid collections appreciated on palpation.  Unclear etiology of nipple lesion, low suspicion that it is infectious in etiology.  Recommend that she continue using warm compresses up to 3 times a day.  May need to refer to dermatology for possible drainage if pain continues.

## 2021-09-30 NOTE — Assessment & Plan Note (Signed)
Vit 12.8 in 2018. She is no longer taking supplements. Can recheck this at next visit.

## 2021-10-01 LAB — BETA HCG QUANT (REF LAB): hCG Quant: 2 m[IU]/mL

## 2021-10-01 NOTE — Progress Notes (Signed)
Internal Medicine Clinic Attending  I saw and evaluated the patient.  I personally confirmed the key portions of the history and exam documented by Dr.  Rehman  and I reviewed pertinent patient test results.  The assessment, diagnosis, and plan were formulated together and I agree with the documentation in the resident's note.  

## 2021-10-05 ENCOUNTER — Other Ambulatory Visit: Payer: Self-pay | Admitting: Obstetrics

## 2021-10-05 DIAGNOSIS — B3731 Acute candidiasis of vulva and vagina: Secondary | ICD-10-CM

## 2021-10-13 ENCOUNTER — Ambulatory Visit: Payer: Medicaid Other | Admitting: Obstetrics

## 2021-11-01 ENCOUNTER — Other Ambulatory Visit: Payer: Self-pay | Admitting: Obstetrics

## 2021-11-01 DIAGNOSIS — N898 Other specified noninflammatory disorders of vagina: Secondary | ICD-10-CM

## 2021-11-01 DIAGNOSIS — B3731 Acute candidiasis of vulva and vagina: Secondary | ICD-10-CM

## 2021-11-01 DIAGNOSIS — R102 Pelvic and perineal pain: Secondary | ICD-10-CM

## 2021-11-02 MED ORDER — FLUCONAZOLE 150 MG PO TABS: 150.0000 mg | ORAL_TABLET | Freq: Once | ORAL | 0 refills | Status: AC

## 2022-01-19 ENCOUNTER — Encounter: Payer: Self-pay | Admitting: Obstetrics

## 2022-01-19 ENCOUNTER — Other Ambulatory Visit (HOSPITAL_COMMUNITY)
Admission: RE | Admit: 2022-01-19 | Discharge: 2022-01-19 | Disposition: A | Discharge status: Home / Self Care | Payer: Medicaid Other | Source: Ambulatory Visit | Attending: Obstetrics | Admitting: Obstetrics

## 2022-01-19 ENCOUNTER — Ambulatory Visit: Payer: Medicaid Other | Admitting: Obstetrics

## 2022-01-19 ENCOUNTER — Encounter: Payer: Self-pay | Admitting: Emergency Medicine

## 2022-01-19 VITALS — BP 102/65 | HR 85 | Ht 65.0 in | Wt 139.6 lb

## 2022-01-19 DIAGNOSIS — Z3490 Encounter for supervision of normal pregnancy, unspecified, unspecified trimester: Secondary | ICD-10-CM

## 2022-01-19 DIAGNOSIS — O219 Vomiting of pregnancy, unspecified: Secondary | ICD-10-CM | POA: Diagnosis not present

## 2022-01-19 DIAGNOSIS — N926 Irregular menstruation, unspecified: Secondary | ICD-10-CM | POA: Diagnosis not present

## 2022-01-19 DIAGNOSIS — N898 Other specified noninflammatory disorders of vagina: Secondary | ICD-10-CM

## 2022-01-19 LAB — POCT URINE PREGNANCY: Preg Test, Ur: POSITIVE — AB

## 2022-01-19 MED ORDER — CLINDAMYCIN HCL 300 MG PO CAPS
300.0000 mg | ORAL_CAPSULE | Freq: Three times a day (TID) | ORAL | 0 refills | Status: DC
Start: 2022-01-19 — End: 2022-02-05

## 2022-01-19 MED ORDER — PROMETHAZINE HCL 25 MG PO TABS: 25.0000 mg | ORAL_TABLET | Freq: Four times a day (QID) | ORAL | 1 refills | Status: DC | PRN

## 2022-01-19 MED ORDER — TERCONAZOLE 0.8 % VA CREA: 1.0000 | TOPICAL_CREAM | Freq: Every day | VAGINAL | 2 refills | Status: DC

## 2022-01-19 NOTE — Progress Notes (Signed)
Patient ID: Tammie Martinez, female   DOB: Apr 10, 1991, 31 y.o.   MRN: [id number] ? ?HPI ?Tammie Martinez is a 31 y.o. female.  Complains of vaginal discharge with odor and irritation.  Also c/o nausea and vomiting, breast tenderness and fatigue. ?HPI ? ?History reviewed. No pertinent past medical history. ? ?Past Surgical History:  ?Procedure Laterality Date  ? NO PAST SURGERIES    ? ? ?Family History  ?Problem Relation Age of Onset  ? Heart disease Father   ? Hypertension Maternal Grandfather   ? COPD Maternal Grandfather   ? Evelene Croon Parkinson White syndrome Brother   ? ? ?Social History ?Social History  ? ?Tobacco Use  ? Smoking status: Never  ? Smokeless tobacco: Never  ?Substance Use Topics  ? Alcohol use: Yes  ?  Alcohol/week: 7.0 standard drinks  ?  Types: 7 Glasses of wine per week  ?  Comment: drinks on weekends  ? Drug use: Yes  ?  Types: Marijuana  ?  Comment: prior to pregnancy  ? ? ?Allergies  ?Allergen Reactions  ? Penicillins Hives  ?  Has patient had a PCN reaction causing immediate rash, facial/tongue/throat swelling, SOB or lightheadedness with hypotension: Yes ?Has patient had a PCN reaction causing severe rash involving mucus membranes or skin necrosis: Yes ?Has patient had a PCN reaction that required hospitalization: No ?Has patient had a PCN reaction occurring within the last 10 years: No ?If all of the above answers are "NO", then may proceed with Cephalosporin use. ?  ? Sulfa Antibiotics Hives  ? ? ?Current Outpatient Medications  ?Medication Sig Dispense Refill  ? clindamycin (CLEOCIN) 300 MG capsule Take 1 capsule (300 mg total) by mouth 3 (three) times daily. 21 capsule 0  ? promethazine (PHENERGAN) 25 MG tablet Take 1 tablet (25 mg total) by mouth every 6 (six) hours as needed for nausea or vomiting. 30 tablet 1  ? terconazole (TERAZOL 3) 0.8 % vaginal cream Place 1 applicator vaginally at bedtime. 20 g 2  ? Doxylamine-Pyridoxine (DICLEGIS) 10-10 MG TBEC Take 1 tablet with breakfast and lunch.   Take 2 tablets at bedtime. (Patient not taking: Reported on 08/31/2021) 100 tablet 4  ? Drospirenone (SLYND) 4 MG TABS Take 1 tablet by mouth daily before breakfast. 28 tablet 11  ? fluconazole (DIFLUCAN) 200 MG tablet TAKE 1 TABLET BY MOUTH DAILY. 1 tablet 0  ? ibuprofen (ADVIL) 800 MG tablet Take 1 tablet (800 mg total) by mouth every 8 (eight) hours as needed. 30 tablet 5  ? norethindrone (MICRONOR,CAMILA,ERRIN) 0.35 MG tablet Take 1 tablet (0.35 mg total) by mouth daily. (Patient not taking: Reported on 08/31/2021) 1 Package 11  ? oxyCODONE-acetaminophen (PERCOCET/ROXICET) 5-325 MG tablet Take 1 tablet by mouth every 4 (four) hours as needed for severe pain. 20 tablet 0  ? polyethylene glycol (MIRALAX) 17 g packet Take 17 g by mouth Nightly. 14 each 11  ? Prenat-Fe Poly-Methfol-FA-DHA (VITAFOL ULTRA) 29-0.6-0.4-200 MG CAPS Take 1 capsule by mouth daily before breakfast. 90 capsule 4  ? ?No current facility-administered medications for this visit.  ? ? ?Review of Systems ?Review of Systems ?Constitutional: negative for fatigue and weight loss ?Respiratory: negative for cough and wheezing ?Cardiovascular: negative for chest pain, fatigue and palpitations ?Gastrointestinal: positive for nausea and vomitingnegative for abdominal pain and change in bowel habits ?Genitourinary:negative ?Integument/breast: negative for nipple discharge ?Musculoskeletal:negative for myalgias ?Neurological: negative for gait problems and tremors ?Behavioral/Psych: negative for abusive relationship, depression ?Endocrine: negative for temperature intolerance    ?  ?  Blood pressure 102/65, pulse 85, height 5\' 5"  (1.651 m), weight 139 lb 9.6 oz (63.3 kg), last menstrual period 11/14/2021, unknown if currently breastfeeding. ? ?Physical Exam ?Physical Exam ?General:   Alert and no distress  ?Skin:   no rash or abnormalities  ?Lungs:   clear to auscultation bilaterally  ?Heart:   regular rate and rhythm, S1, S2 normal, no murmur, click, rub or  gallop  ?Breasts:   normal without suspicious masses, skin or nipple changes or axillary nodes  ?Abdomen:  normal findings: no organomegaly, soft, non-tender and no hernia  ?Pelvis:  External genitalia: normal general appearance ?Urinary system: urethral meatus normal and bladder without fullness, nontender ?Vaginal: normal without tenderness, induration or masses ?Cervix: normal appearance ?Adnexa: normal bimanual exam ?Uterus: anteverted and non-tender, normal size  ?  ?I have spent a total of 20 minutes of face-to-face time, excluding clinical staff time, reviewing notes and preparing to see patient, ordering tests and/or medications, and counseling the patient.  ? ?Data Reviewed ?Wet Prep  ?Cultures ? ?Assessment  ?   ?1. Vaginal discharge ?Rx: ?- Cervicovaginal ancillary only( [place]) ?- terconazole (TERAZOL 3) 0.8 % vaginal cream; Place 1 applicator vaginally at bedtime.  Dispense: 20 g; Refill: 2 ? ?2. Vaginal odor ?Rx: ?- clindamycin (CLEOCIN) 300 MG capsule; Take 1 capsule (300 mg total) by mouth 3 (three) times daily.  Dispense: 21 capsule; Refill: 0 ? ?3. Missed period ?Rx: ?- POCT urine pregnancy ? ?4. Early stage of pregnancy ? ?5. Nausea and vomiting during pregnancy ?Rx: ?- promethazine (PHENERGAN) 25 MG tablet; Take 1 tablet (25 mg total) by mouth every 6 (six) hours as needed for nausea or vomiting.  Dispense: 30 tablet; Refill: 1  ? ? ?Plan ?  Follow up prn ? ?Orders Placed This Encounter  ?Procedures  ? POCT urine pregnancy  ? ?Meds ordered this encounter  ?Medications  ? terconazole (TERAZOL 3) 0.8 % vaginal cream  ?  Sig: Place 1 applicator vaginally at bedtime.  ?  Dispense:  20 g  ?  Refill:  2  ? clindamycin (CLEOCIN) 300 MG capsule  ?  Sig: Take 1 capsule (300 mg total) by mouth 3 (three) times daily.  ?  Dispense:  21 capsule  ?  Refill:  0  ? promethazine (PHENERGAN) 25 MG tablet  ?  Sig: Take 1 tablet (25 mg total) by mouth every 6 (six) hours as needed for nausea or vomiting.  ?   Dispense:  30 tablet  ?  Refill:  1  ? ?  ? 11/16/2021, MD ?01/19/2022 12:08 PM  ?

## 2022-01-19 NOTE — Progress Notes (Signed)
Patient presents for STD screen. States she's had vaginal odor for 2 days, with runny white discharge for the past couple of months.  ?Has taken several tx for yeast, but symptoms return. ? ?Also requests UPT, LMP 10/25/21. Reports poor appetite, fatigue, vomiting, dizziness.  ?Wants to do self- swab, and then consult with provider. ? ?UPT positive in office today. [redacted]w[redacted]d. ? ?

## 2022-01-20 LAB — CERVICOVAGINAL ANCILLARY ONLY
Bacterial Vaginitis (gardnerella): POSITIVE — AB
Candida Glabrata: NEGATIVE
Candida Vaginitis: POSITIVE — AB
Chlamydia: NEGATIVE
Comment: NEGATIVE
Comment: NEGATIVE
Comment: NEGATIVE
Comment: NEGATIVE
Comment: NEGATIVE
Comment: NORMAL
Neisseria Gonorrhea: NEGATIVE
Trichomonas: NEGATIVE

## 2022-01-21 ENCOUNTER — Ambulatory Visit (INDEPENDENT_AMBULATORY_CARE_PROVIDER_SITE_OTHER): Payer: Medicaid Other

## 2022-01-21 VITALS — BP 103/63 | HR 73 | Ht 64.5 in | Wt 140.3 lb

## 2022-01-21 DIAGNOSIS — Z3481 Encounter for supervision of other normal pregnancy, first trimester: Secondary | ICD-10-CM

## 2022-01-21 DIAGNOSIS — Z3A08 8 weeks gestation of pregnancy: Secondary | ICD-10-CM | POA: Diagnosis not present

## 2022-01-21 DIAGNOSIS — O3680X0 Pregnancy with inconclusive fetal viability, not applicable or unspecified: Secondary | ICD-10-CM

## 2022-01-21 DIAGNOSIS — Z3A09 9 weeks gestation of pregnancy: Secondary | ICD-10-CM

## 2022-01-21 DIAGNOSIS — Z3490 Encounter for supervision of normal pregnancy, unspecified, unspecified trimester: Secondary | ICD-10-CM | POA: Insufficient documentation

## 2022-01-21 MED ORDER — BLOOD PRESSURE KIT DEVI: 1.0000 | 0 refills | Status: DC

## 2022-01-21 MED ORDER — VITAFOL ULTRA 29-0.6-0.4-200 MG PO CAPS: 1.0000 | ORAL_CAPSULE | Freq: Every day | ORAL | 11 refills | Status: DC

## 2022-01-21 MED ORDER — GOJJI WEIGHT SCALE MISC: 1.0000 | 0 refills | Status: DC

## 2022-01-21 NOTE — Progress Notes (Signed)
Agree with nurses's documentation of this patient's clinic encounter.  Jesilyn Easom L, MD  

## 2022-01-21 NOTE — Progress Notes (Cosign Needed)
New OB Intake ? ?I connected with  [place] A [place] on 01/21/22 at  8:15 AM EDT by in person and verified that I am speaking with the correct person using two identifiers. Nurse is located at [facility] and pt is located at [place]. ? ?I discussed the limitations, risks, security and privacy concerns of performing an evaluation and management service by telephone and the availability of in person appointments. I also discussed with the patient that there may be a patient responsible charge related to this service. The patient expressed understanding and agreed to proceed. ? ?I explained I am completing New OB Intake today. We discussed her EDD of 08/28/22 that is based on early u/s. Pt is G6/P2032. I reviewed her allergies, medications, Medical/Surgical/OB history, and appropriate screenings. I informed her of [facility] services. Based on history, this is a/an  pregnancy uncomplicated .  ? ?Patient Active Problem List  ? Diagnosis Date Noted  ? Lesion of left nipple 09/30/2021  ? Vitamin D deficiency 09/25/2017  ? Miscarriage 02/17/2017  ? ? ?Concerns addressed today ? ?Delivery Plans:  ?Plans to deliver at [facility].  ? ?MyChart/Babyscripts ?MyChart access verified. I explained pt will have some visits in office and some virtually. Babyscripts instructions given and order placed. Patient verifies receipt of registration text/e-mail. Account successfully created and app downloaded. ? ?Blood Pressure Cuff  ?Blood pressure cuff ordered for patient to pick-up from [employer]. Explained after first prenatal appt pt will check weekly and document in Babyscripts. ? ?Weight scale: Patient does not  have weight scale. Weight scale ordered for patient to pick up from [employer].  ? ?Anatomy US ?Explained first scheduled [place] will be around 19 weeks. Dating and viability scan performed today. ? ?Labs ?Discussed Avelina Laine genetic screening with patient. Would like both Panorama and Horizon drawn at new OB visit.Also if interested in  genetic testing, tell patient she will need AFP 15-21 weeks to complete genetic testing .Routine prenatal labs needed. ? ?Covid Vaccine ?Patient has not covid vaccine.  ? ?Is patient a CenteringPregnancy candidate?  ?Declined ?Declined due to Other   ?Not a candidate due to [facility] interested Is patient a CenteringPregnancy candidate?  Not interested   "Centering Patient" indicated on sticky note ?  ?Is patient a Mom+Baby Combined Care candidate?  ?Declined   ? Scheduled with Mom+Baby provider  ?  ?Is patient interested in [place]?  ?Yes  "Interested in WB - Schedule next visit with CNM" on sticky note ? ?Informed patient of [facility] website  and placed link in her AVS.  ? ?Social Determinants of Health ?Food Insecurity: Patient denies food insecurity. ?WIC Referral: Patient is interested in referral to [facility].  ?Transportation: Patient denies transportation needs. ?Childcare: Discussed no children allowed at ultrasound appointments. Offered childcare services; patient declines childcare services at this time. ? ?Send link to Pregnancy Navigators ? ? ?Placed OB Box on problem list and updated ? ?First visit review ?I reviewed new OB appt with pt. I explained she will have a pelvic exam, ob bloodwork with genetic screening, and PAP smear. Explained pt will be seen by Clayton Bibles at first visit; encounter routed to appropriate provider. Explained that patient will be seen by pregnancy navigator following visit with provider. [facility] information placed in AVS.  ? ?Hamilton Capri, RN ?01/21/2022  8:26 AM  ?

## 2022-01-21 NOTE — Progress Notes (Signed)
Agree with nurses's documentation of this patient's clinic encounter.  Aysen Shieh L, MD  

## 2022-01-22 ENCOUNTER — Other Ambulatory Visit: Payer: Self-pay | Admitting: Obstetrics

## 2022-01-24 ENCOUNTER — Telehealth: Payer: Self-pay

## 2022-01-24 NOTE — Telephone Encounter (Signed)
S/w pt and advised of results and rx 

## 2022-02-05 ENCOUNTER — Inpatient Hospital Stay (HOSPITAL_COMMUNITY)
Admission: AD | Admit: 2022-02-05 | Discharge: 2022-02-05 | Disposition: A | Discharge status: Home / Self Care | Payer: Medicaid Other | Attending: Obstetrics & Gynecology | Admitting: Obstetrics & Gynecology

## 2022-02-05 ENCOUNTER — Other Ambulatory Visit: Payer: Self-pay

## 2022-02-05 ENCOUNTER — Encounter (HOSPITAL_COMMUNITY): Payer: Self-pay | Admitting: Obstetrics & Gynecology

## 2022-02-05 DIAGNOSIS — W109XXA Fall (on) (from) unspecified stairs and steps, initial encounter: Secondary | ICD-10-CM

## 2022-02-05 DIAGNOSIS — W108XXA Fall (on) (from) other stairs and steps, initial encounter: Secondary | ICD-10-CM | POA: Diagnosis not present

## 2022-02-05 DIAGNOSIS — Z3A10 10 weeks gestation of pregnancy: Secondary | ICD-10-CM

## 2022-02-05 DIAGNOSIS — O26891 Other specified pregnancy related conditions, first trimester: Secondary | ICD-10-CM | POA: Insufficient documentation

## 2022-02-05 DIAGNOSIS — R109 Unspecified abdominal pain: Secondary | ICD-10-CM | POA: Diagnosis not present

## 2022-02-05 NOTE — MAU Provider Note (Signed)
History     [id number]  Arrival date and time: 02/05/22 1642    Chief Complaint  Patient presents with   Abdominal Pain   Fall     HPI Tammie Martinez is a 31 y.o. at [redacted]w[redacted]d who presents for abdominal pain. States she tripped walking up the stairs this morning & landed on her abdomen. Started having mid abdominal soreness this afternoon. Denies vaginal bleeding. No LOC. Did not hit her head. Hasn't treated symptoms.    OB History     Gravida  6   Para  2   Term  2   Preterm      AB  3   Living  2      SAB  3   IAB      Ectopic      Multiple  0   Live Births  2           Past Medical History:  Diagnosis Date   Medical history non-contributory     Past Surgical History:  Procedure Laterality Date   NO PAST SURGERIES      Family History  Problem Relation Age of Onset   Healthy Mother    Heart disease Father    Yves Dill Parkinson White syndrome Brother    Hypertension Maternal Grandfather    COPD Maternal Grandfather     Allergies  Allergen Reactions   Penicillins Hives    Has patient had a PCN reaction causing immediate rash, facial/tongue/throat swelling, SOB or lightheadedness with hypotension: Yes Has patient had a PCN reaction causing severe rash involving mucus membranes or skin necrosis: Yes Has patient had a PCN reaction that required hospitalization: No Has patient had a PCN reaction occurring within the last 10 years: No If all of the above answers are "NO", then may proceed with Cephalosporin use.    Sulfa Antibiotics Hives    No current facility-administered medications on file prior to encounter.   Current Outpatient Medications on File Prior to Encounter  Medication Sig Dispense Refill   promethazine (PHENERGAN) 25 MG tablet Take 1 tablet (25 mg total) by mouth every 6 (six) hours as needed for nausea or vomiting. 30 tablet 1   Blood Pressure Monitoring (BLOOD PRESSURE KIT) DEVI 1 kit by Does not apply route once a week. 1 each 0    Misc. Devices (GOJJI WEIGHT SCALE) MISC 1 Device by Does not apply route every 30 (thirty) days. 1 each 0   Prenat-Fe Poly-Methfol-FA-DHA (VITAFOL ULTRA) 29-0.6-0.4-200 MG CAPS Take 1 capsule by mouth daily. 30 capsule 11     ROS Pertinent positives and negative per HPI, all others reviewed and negative  Physical Exam   BP (!) 107/49 (BP Location: Right Arm)   Pulse 78   Temp 98.6 F (37 C) (Oral)   Resp 20   Ht 5' 4.5" (1.638 m)   Wt 62.5 kg   LMP 11/14/2021   SpO2 100%   BMI 23.27 kg/m   Patient Vitals for the past 24 hrs:  BP Temp Temp src Pulse Resp SpO2 Height Weight  02/05/22 1800 (!) 107/49 98.6 F (37 C) Oral 78 20 100 % -- --  02/05/22 1659 (!) 110/49 98.3 F (36.8 C) Oral 63 18 100 % -- --  02/05/22 1654 -- -- -- -- -- -- 5' 4.5" (1.638 m) 62.5 kg    Physical Exam Vitals and nursing note reviewed.  Constitutional:      General: She is not in acute distress.  Appearance: She is well-developed.  Pulmonary:     Effort: Pulmonary effort is normal. No respiratory distress.  Abdominal:     General: Abdomen is flat.     Palpations: Abdomen is soft.     Tenderness: There is no abdominal tenderness. There is no guarding or rebound.     Comments: No bruising  Skin:    General: Skin is warm and dry.  Neurological:     Mental Status: She is alert.      Bedside Ultrasound Pt informed that the ultrasound is considered a limited OB ultrasound and is not intended to be a complete ultrasound exam.  Patient also informed that the ultrasound is not being completed with the intent of assessing for fetal or placental anomalies or any pelvic abnormalities.  Explained that the purpose of today's ultrasound is to assess for  viability.  Patient acknowledges the purpose of the exam and the limitations of the study.     My interpretation: Live IUP with FHR 174 bpm   Labs No results found for this or any previous visit (from the past 24 hour(s)).  Imaging No results  found.  MAU Course  Procedures Lab Orders  No laboratory test(s) ordered today   No orders of the defined types were placed in this encounter.  Imaging Orders  No imaging studies ordered today    MDM Abdomen soft & non tender. FHT present via doppler & BSUS.  Assessment and Plan   1. Fall (on) (from) unspecified stairs and steps, initial encounter   2. Abdominal pain during pregnancy in first trimester   3. [redacted] weeks gestation of pregnancy    -Tylenol & heat prn -Reviewed reasons to return to [place], NP 02/05/22 6:56 PM

## 2022-02-05 NOTE — MAU Note (Signed)
Tammie Martinez is a 31 y.o. at [redacted]w[redacted]d here in MAU reporting: fell this morning @ 1145 while going up steps, reports fell on abdomen and now is having abdominal pain.  Denies VB.  Onset of complaint: today Pain score: 8/10 Vitals:   02/05/22 1659  BP: (!) 110/49  Pulse: 63  Resp: 18  Temp: 98.3 F (36.8 C)  SpO2: 100%     FHT: 168 bpm Lab orders placed from triage:  UA

## 2022-02-05 NOTE — Discharge Instructions (Signed)

## 2022-02-24 ENCOUNTER — Encounter: Payer: Medicaid Other | Admitting: Advanced Practice Midwife

## 2022-03-01 ENCOUNTER — Ambulatory Visit: Payer: Medicaid Other

## 2022-03-07 ENCOUNTER — Other Ambulatory Visit (HOSPITAL_COMMUNITY)
Admission: RE | Admit: 2022-03-07 | Discharge: 2022-03-07 | Disposition: A | Discharge status: Home / Self Care | Payer: Medicaid Other | Source: Ambulatory Visit | Attending: Obstetrics and Gynecology | Admitting: Obstetrics and Gynecology

## 2022-03-07 ENCOUNTER — Ambulatory Visit (INDEPENDENT_AMBULATORY_CARE_PROVIDER_SITE_OTHER): Payer: Medicaid Other

## 2022-03-07 DIAGNOSIS — N898 Other specified noninflammatory disorders of vagina: Secondary | ICD-10-CM | POA: Insufficient documentation

## 2022-03-07 NOTE — Progress Notes (Signed)
Patient was assessed and managed by nursing staff during this encounter. I have reviewed the chart and agree with the documentation and plan. I have also made any necessary editorial changes.  Warden Fillers, MD 03/07/2022 11:03 AM

## 2022-03-07 NOTE — Progress Notes (Signed)
..  SUBJECTIVE:  31 y.o. female complains of white vaginal discharge for 2 week(s). Denies abnormal vaginal bleeding or significant pelvic pain or fever. No UTI symptoms. Denies history of known exposure to STD.  Patient's last menstrual period was 11/14/2021.  OBJECTIVE:  She appears well, afebrile. Urine dipstick: not done.  ASSESSMENT:  Vaginal Discharge  Vaginal Odor   PLAN:  GC, chlamydia, trichomonas, BVAG, CVAG probe sent to lab. Treatment: To be determined once lab results are received ROV prn if symptoms persist or worsen. NOB is scheduled for 03/21/22

## 2022-03-08 LAB — CERVICOVAGINAL ANCILLARY ONLY
Bacterial Vaginitis (gardnerella): POSITIVE — AB
Candida Glabrata: NEGATIVE
Candida Vaginitis: POSITIVE — AB
Chlamydia: NEGATIVE
Comment: NEGATIVE
Comment: NEGATIVE
Comment: NEGATIVE
Comment: NEGATIVE
Comment: NEGATIVE
Comment: NORMAL
Neisseria Gonorrhea: NEGATIVE
Trichomonas: NEGATIVE

## 2022-03-09 ENCOUNTER — Other Ambulatory Visit: Payer: Self-pay | Admitting: Emergency Medicine

## 2022-03-09 DIAGNOSIS — N898 Other specified noninflammatory disorders of vagina: Secondary | ICD-10-CM

## 2022-03-09 MED ORDER — TERCONAZOLE 0.8 % VA CREA: 1.0000 | TOPICAL_CREAM | Freq: Every day | VAGINAL | 0 refills | Status: DC

## 2022-03-09 MED ORDER — METRONIDAZOLE 0.75 % VA GEL: 1.0000 | Freq: Every day | VAGINAL | 1 refills | Status: DC

## 2022-03-09 NOTE — Progress Notes (Signed)
Rx sent to pharmacy   

## 2022-03-21 ENCOUNTER — Ambulatory Visit (INDEPENDENT_AMBULATORY_CARE_PROVIDER_SITE_OTHER): Payer: Medicaid Other | Admitting: Obstetrics and Gynecology

## 2022-03-21 ENCOUNTER — Other Ambulatory Visit (HOSPITAL_COMMUNITY)
Admission: RE | Admit: 2022-03-21 | Discharge: 2022-03-21 | Disposition: A | Discharge status: Home / Self Care | Payer: Medicaid Other | Source: Ambulatory Visit | Attending: Advanced Practice Midwife | Admitting: Advanced Practice Midwife

## 2022-03-21 VITALS — BP 104/66 | HR 67 | Wt 133.7 lb

## 2022-03-21 DIAGNOSIS — Z3A17 17 weeks gestation of pregnancy: Secondary | ICD-10-CM

## 2022-03-21 DIAGNOSIS — Z3481 Encounter for supervision of other normal pregnancy, first trimester: Secondary | ICD-10-CM | POA: Diagnosis not present

## 2022-03-21 NOTE — Progress Notes (Signed)
NOB in office, reports fetal movement, denies pain. Intake and U/S was completed on 01/21/22

## 2022-03-21 NOTE — Progress Notes (Signed)
INITIAL PRENATAL VISIT NOTE  Subjective:  Tammie Martinez is a 31 y.o. [phone] at [redacted]w[redacted]d by early ultrasound being seen today for her initial prenatal visit. She has an obstetric history significant for SVD x 2. She has an uncomplicated medical history.  Patient reports no complaints.  Contractions: Not present. Vag. Bleeding: None.  Movement: Present. Denies leaking of fluid.    Past Medical History:  Diagnosis Date   Medical history non-contributory     Past Surgical History:  Procedure Laterality Date   NO PAST SURGERIES      OB History  Gravida Para Term Preterm AB Living  $Remov'6 2 2   3 2  'EETHna$ SAB IAB Ectopic Multiple Live Births  3     0 2    # Outcome Date GA Lbr Len/2nd Weight Sex Delivery Anes PTL Lv  6 Current           5 SAB 07/2021          4 Term 02/27/18 [redacted]w[redacted]d 04:38 / 00:24 7 lb 15.5 oz (3.615 kg) M Vag-Spont EPI  LIV  3 SAB 02/2017 [redacted]w[redacted]d         2 Term 03/17/10 [redacted]w[redacted]d   M Vag-Spont   LIV  1 SAB 2009 [redacted]w[redacted]d       DEC    Social History   Socioeconomic History   Marital status: Single    Spouse name: Not on file   Number of children: Not on file   Years of education: Not on file   Highest education level: Not on file  Occupational History   Not on file  Tobacco Use   Smoking status: Never   Smokeless tobacco: Never  Vaping Use   Vaping Use: Never used  Substance and Sexual Activity   Alcohol use: Not Currently    Alcohol/week: 7.0 standard drinks of alcohol    Types: 7 Glasses of wine per week    Comment: drinks on weekends, not since confirmed pregnancy   Drug use: Not Currently    Frequency: 7.0 times per week    Types: Marijuana    Comment: Quit smoking   Sexual activity: Yes    Partners: Male    Birth control/protection: None  Other Topics Concern   Not on file  Social History Narrative   Not on file   Social Determinants of Health   Financial Resource Strain: Not on file  Food Insecurity: Not on file  Transportation Needs: Not on file   Physical Activity: Not on file  Stress: Not on file  Social Connections: Not on file    Family History  Problem Relation Age of Onset   Healthy Mother    Heart disease Father    Yves Dill Parkinson White syndrome Brother    Hypertension Maternal Grandfather    COPD Maternal Grandfather      Current Outpatient Medications:    Blood Pressure Monitoring (BLOOD PRESSURE KIT) DEVI, 1 kit by Does not apply route once a week., Disp: 1 each, Rfl: 0   Misc. Devices (GOJJI WEIGHT SCALE) MISC, 1 Device by Does not apply route every 30 (thirty) days., Disp: 1 each, Rfl: 0   Prenat-Fe Poly-Methfol-FA-DHA (VITAFOL ULTRA) 29-0.6-0.4-200 MG CAPS, Take 1 capsule by mouth daily., Disp: 30 capsule, Rfl: 11   terconazole (TERAZOL 3) 0.8 % vaginal cream, Place 1 applicator vaginally at bedtime. Apply nightly for three nights., Disp: 20 g, Rfl: 0   metroNIDAZOLE (METROGEL) 0.75 % vaginal gel, Place 1 Applicatorful vaginally  at bedtime. Apply one applicatorful to vagina at bedtime for 5 days, Disp: 70 g, Rfl: 1   promethazine (PHENERGAN) 25 MG tablet, Take 1 tablet (25 mg total) by mouth every 6 (six) hours as needed for nausea or vomiting. (Patient not taking: Reported on 03/21/2022), Disp: 30 tablet, Rfl: 1  Allergies  Allergen Reactions   Penicillins Hives    Has patient had a PCN reaction causing immediate rash, facial/tongue/throat swelling, SOB or lightheadedness with hypotension: Yes Has patient had a PCN reaction causing severe rash involving mucus membranes or skin necrosis: Yes Has patient had a PCN reaction that required hospitalization: No Has patient had a PCN reaction occurring within the last 10 years: No If all of the above answers are "NO", then may proceed with Cephalosporin use.    Sulfa Antibiotics Hives    Review of Systems: Negative except for what is mentioned in HPI.  Objective:   Vitals:   03/21/22 1019  BP: 104/66  Pulse: 67  Weight: 133 lb 11.2 oz (60.6 kg)    Fetal  Status: Fetal Heart Rate (bpm): 146   Movement: Present     Physical Exam: BP 104/66   Pulse 67   Wt 133 lb 11.2 oz (60.6 kg)   LMP 11/14/2021   BMI 22.60 kg/m  CONSTITUTIONAL: Well-developed, well-nourished female in no acute distress.  NEUROLOGIC: Alert and oriented to person, place, and time. Normal reflexes, muscle tone coordination. No cranial nerve deficit noted. PSYCHIATRIC: Normal mood and affect. Normal behavior. Normal judgment and thought content. SKIN: Skin is warm and dry. No rash noted. Not diaphoretic. No erythema. No pallor. HENT:  Normocephalic, atraumatic, External right and left ear normal. Oropharynx is clear and moist EYES: Conjunctivae and EOM are normal.  NECK: Normal range of motion, supple, no masses CARDIOVASCULAR: Normal heart rate noted, regular rhythm RESPIRATORY: Effort and breath sounds normal, no problems with respiration noted BREASTS: deferred ABDOMEN: Soft, nontender, nondistended, gravid. GU: normal appearing external female genitalia, multiparous, normal appearing cervix, scant white discharge in vagina, no lesions noted Bimanual: 17 weeks sized uterus, no adnexal tenderness or palpable lesions noted MUSCULOSKELETAL: Normal range of motion. EXT:  No edema and no tenderness. 2+ distal pulses.   Assessment and Plan:  Pregnancy: [id number] at [redacted]w[redacted]d by ultrasound  1. Encounter for supervision of other normal pregnancy in first trimester Continue routine prenatal care Schedule anatomy scan in 2-3 weeks - Culture, OB Urine - Panorama Prenatal Test Full Panel - HORIZON CUSTOM - CBC/D/Plt+RPR+Rh+ABO+RubIgG... - Hepatitis C Antibody - [place] MFM OB DETAIL +14 WK; Future  2. [redacted] weeks gestation of pregnancy    Preterm labor symptoms and general obstetric precautions including but not limited to vaginal bleeding, contractions, leaking of fluid and fetal movement were reviewed in detail with the patient.  Please refer to After Visit Summary for other  counseling recommendations.   Return in about 4 weeks (around 04/18/2022) for ROB, in person.  Griffin Basil 03/21/2022 10:42 AM

## 2022-03-22 LAB — CBC/D/PLT+RPR+RH+ABO+RUBIGG...
Antibody Screen: NEGATIVE
Basophils Absolute: 0 10*3/uL (ref 0.0–0.2)
Basos: 0 %
EOS (ABSOLUTE): 0.3 10*3/uL (ref 0.0–0.4)
Eos: 3 %
HCV Ab: NONREACTIVE
HIV Screen 4th Generation wRfx: NONREACTIVE
Hematocrit: 31.2 % — ABNORMAL LOW (ref 34.0–46.6)
Hemoglobin: 9.9 g/dL — ABNORMAL LOW (ref 11.1–15.9)
Hepatitis B Surface Ag: NEGATIVE
Immature Grans (Abs): 0.2 10*3/uL — ABNORMAL HIGH (ref 0.0–0.1)
Immature Granulocytes: 2 %
Lymphocytes Absolute: 2.7 10*3/uL (ref 0.7–3.1)
Lymphs: 25 %
MCH: 27.8 pg (ref 26.6–33.0)
MCHC: 31.7 g/dL (ref 31.5–35.7)
MCV: 88 fL (ref 79–97)
Monocytes Absolute: 1 10*3/uL — ABNORMAL HIGH (ref 0.1–0.9)
Monocytes: 9 %
Neutrophils Absolute: 6.9 10*3/uL (ref 1.4–7.0)
Neutrophils: 61 %
Platelets: 288 10*3/uL (ref 150–450)
RBC: 3.56 x10E6/uL — ABNORMAL LOW (ref 3.77–5.28)
RDW: 11.9 % (ref 11.7–15.4)
RPR Ser Ql: NONREACTIVE
Rh Factor: POSITIVE
Rubella Antibodies, IGG: <0.9 index — ABNORMAL LOW (ref >0.99)
WBC: 11 10*3/uL — ABNORMAL HIGH (ref 3.4–10.8)

## 2022-03-22 LAB — HCV INTERPRETATION

## 2022-03-22 LAB — HEPATITIS C ANTIBODY: Hep C Virus Ab: NONREACTIVE

## 2022-03-23 LAB — AFP, SERUM, OPEN SPINA BIFIDA
AFP MoM: 1.34
AFP Value: 57.2 ng/mL
Gest. Age on Collection Date: 17 weeks
Maternal Age At EDD: 32.7 yr
OSBR Risk 1 IN: 4273
Test Results:: NEGATIVE
Weight: 133 [lb_av]

## 2022-03-23 LAB — CERVICOVAGINAL ANCILLARY ONLY
Bacterial Vaginitis (gardnerella): NEGATIVE
Candida Glabrata: NEGATIVE
Candida Vaginitis: POSITIVE — AB
Chlamydia: NEGATIVE
Comment: NEGATIVE
Comment: NEGATIVE
Comment: NEGATIVE
Comment: NEGATIVE
Comment: NEGATIVE
Comment: NORMAL
Neisseria Gonorrhea: NEGATIVE
Trichomonas: NEGATIVE

## 2022-03-23 LAB — URINE CULTURE, OB REFLEX

## 2022-03-23 LAB — CULTURE, OB URINE

## 2022-03-27 LAB — PANORAMA PRENATAL TEST FULL PANEL:PANORAMA TEST PLUS 5 ADDITIONAL MICRODELETIONS: FETAL FRACTION: 9.2

## 2022-03-29 ENCOUNTER — Other Ambulatory Visit: Payer: Self-pay

## 2022-03-29 DIAGNOSIS — N898 Other specified noninflammatory disorders of vagina: Secondary | ICD-10-CM

## 2022-03-29 MED ORDER — TERCONAZOLE 0.8 % VA CREA: 1.0000 | TOPICAL_CREAM | Freq: Every day | VAGINAL | 0 refills | Status: DC

## 2022-04-01 ENCOUNTER — Telehealth: Payer: Self-pay

## 2022-04-01 DIAGNOSIS — N898 Other specified noninflammatory disorders of vagina: Secondary | ICD-10-CM

## 2022-04-01 MED ORDER — TERCONAZOLE 0.8 % VA CREA: 1.0000 | TOPICAL_CREAM | Freq: Every day | VAGINAL | 0 refills | Status: DC

## 2022-04-01 NOTE — Telephone Encounter (Signed)
Returned call and pt reports vaginal itching, thick white discharge. Sent rx per protocol.

## 2022-04-18 ENCOUNTER — Other Ambulatory Visit (HOSPITAL_COMMUNITY)
Admission: RE | Admit: 2022-04-18 | Discharge: 2022-04-18 | Disposition: A | Discharge status: Home / Self Care | Payer: Medicaid Other | Source: Ambulatory Visit | Attending: Obstetrics & Gynecology | Admitting: Obstetrics & Gynecology

## 2022-04-18 ENCOUNTER — Ambulatory Visit (INDEPENDENT_AMBULATORY_CARE_PROVIDER_SITE_OTHER): Payer: Medicaid Other | Admitting: Obstetrics & Gynecology

## 2022-04-18 ENCOUNTER — Encounter: Payer: Self-pay | Admitting: Obstetrics & Gynecology

## 2022-04-18 VITALS — BP 107/66 | HR 92 | Wt 146.0 lb

## 2022-04-18 DIAGNOSIS — O23592 Infection of other part of genital tract in pregnancy, second trimester: Secondary | ICD-10-CM

## 2022-04-18 DIAGNOSIS — N649 Disorder of breast, unspecified: Secondary | ICD-10-CM

## 2022-04-18 DIAGNOSIS — N760 Acute vaginitis: Secondary | ICD-10-CM | POA: Insufficient documentation

## 2022-04-18 DIAGNOSIS — Z3A21 21 weeks gestation of pregnancy: Secondary | ICD-10-CM

## 2022-04-18 DIAGNOSIS — Z3482 Encounter for supervision of other normal pregnancy, second trimester: Secondary | ICD-10-CM

## 2022-04-18 MED ORDER — FLUCONAZOLE 150 MG PO TABS: 150.0000 mg | ORAL_TABLET | Freq: Once | ORAL | 1 refills | Status: AC

## 2022-04-18 NOTE — Progress Notes (Addendum)
   PRENATAL VISIT NOTE  Subjective:  Tammie Martinez is a 31 y.o. [phone] at [redacted]w[redacted]d being seen today for ongoing prenatal care.  She is currently monitored for the following issues for this low-risk pregnancy and has Vitamin D deficiency; Lesion of left nipple; and Supervision of normal pregnancy on their problem list.  Patient reports persistent yeast infection, wants re-testing and re-treatment.  Also worried about left nipple lesion, has been present for over a year, no symptoms but wants to make sure it will not interfere with breastfeeding.   Contractions: Not present. Vag. Bleeding: None.  Movement: Present. Denies leaking of fluid.   The following portions of the patient's history were reviewed and updated as appropriate: allergies, current medications, past family history, past medical history, past social history, past surgical history and problem list.   Objective:   Vitals:   04/18/22 0938  BP: 107/66  Pulse: 92  Weight: 146 lb (66.2 kg)    Fetal Status: Fetal Heart Rate (bpm): 150   Movement: Present     General:  Alert, oriented and cooperative. Patient is in no acute distress.  Skin: Skin is warm and dry. No rash noted.  Small, non tender sebaceous cyst seen on inferior nipple of left breast, no erythema. Breast exam done in presence of chaperone.  Cardiovascular: Normal heart rate noted  Respiratory: Normal respiratory effort, no problems with respiration noted  Abdomen: Soft, gravid, appropriate for gestational age.  Pain/Pressure: Absent     Pelvic: Cervical exam deferred        Extremities: Normal range of motion.     Mental Status: Normal mood and affect. Normal behavior. Normal judgment and thought content.   Assessment and Plan:  Pregnancy: [id number] at [redacted]w[redacted]d 1. Lesion of left nipple Patient reassured, advised this will not interfere with breastfeeding. No intervention needed.   2. Vaginitis during pregnancy in second trimester Likely persistent yeast after Terazol  3 day regimen. Offered seven day regimen vs one dose of Diflucan, she prefers Diflucan. This was prescribed. Will follow up testing also and manage accordingly. - Cervicovaginal ancillary only( [place]) - fluconazole (DIFLUCAN) 150 MG tablet; Take 1 tablet (150 mg total) by mouth once for 1 dose. Can take additional dose three days later if symptoms persist  Dispense: 1 tablet; Refill: 1  3. [redacted] weeks gestation of pregnancy 4. Encounter for supervision of other normal pregnancy in second trimester Low risk NIPS, AFP negative. Anatomy scan tomorrow. Will follow up results and manage accordingly. Preterm labor symptoms and general obstetric precautions including but not limited to vaginal bleeding, contractions, leaking of fluid and fetal movement were reviewed in detail with the patient. Please refer to After Visit Summary for other counseling recommendations.   Return in about 4 weeks (around 05/16/2022) for OFFICE OB VISIT (MD or APP).  Future Appointments  Date Time Provider Department Center  04/19/2022  9:30 AM [facility] NURSE [facility]  04/19/2022  9:45 AM [facility] US6 [facility]    Jaynie Collins, MD

## 2022-04-19 ENCOUNTER — Other Ambulatory Visit: Payer: Self-pay | Admitting: *Deleted

## 2022-04-19 ENCOUNTER — Ambulatory Visit: Payer: Medicaid Other | Admitting: *Deleted

## 2022-04-19 ENCOUNTER — Ambulatory Visit: Payer: Medicaid Other | Attending: Obstetrics and Gynecology

## 2022-04-19 ENCOUNTER — Encounter: Payer: Self-pay | Admitting: *Deleted

## 2022-04-19 VITALS — BP 97/60 | HR 74

## 2022-04-19 DIAGNOSIS — Z368A Encounter for antenatal screening for other genetic defects: Secondary | ICD-10-CM | POA: Diagnosis not present

## 2022-04-19 DIAGNOSIS — Z3A21 21 weeks gestation of pregnancy: Secondary | ICD-10-CM | POA: Diagnosis not present

## 2022-04-19 DIAGNOSIS — O358XX0 Maternal care for other (suspected) fetal abnormality and damage, not applicable or unspecified: Secondary | ICD-10-CM | POA: Diagnosis not present

## 2022-04-19 DIAGNOSIS — Z3481 Encounter for supervision of other normal pregnancy, first trimester: Secondary | ICD-10-CM | POA: Diagnosis not present

## 2022-04-19 DIAGNOSIS — Z3482 Encounter for supervision of other normal pregnancy, second trimester: Secondary | ICD-10-CM | POA: Insufficient documentation

## 2022-04-19 DIAGNOSIS — O283 Abnormal ultrasonic finding on antenatal screening of mother: Secondary | ICD-10-CM

## 2022-04-20 ENCOUNTER — Encounter: Payer: Self-pay | Admitting: Obstetrics and Gynecology

## 2022-04-20 DIAGNOSIS — D563 Thalassemia minor: Secondary | ICD-10-CM | POA: Insufficient documentation

## 2022-04-21 ENCOUNTER — Telehealth: Payer: Self-pay

## 2022-04-21 NOTE — Telephone Encounter (Signed)
Attempted to contact about horizon results and genetic counseling, no answer, left vm

## 2022-04-25 LAB — CERVICOVAGINAL ANCILLARY ONLY
Bacterial Vaginitis (gardnerella): NEGATIVE
Candida Glabrata: NEGATIVE
Candida Vaginitis: POSITIVE — AB
Comment: NEGATIVE
Comment: NEGATIVE
Comment: NEGATIVE
Comment: NEGATIVE
Trichomonas: NEGATIVE

## 2022-05-16 ENCOUNTER — Encounter: Payer: Self-pay | Admitting: Obstetrics and Gynecology

## 2022-05-16 ENCOUNTER — Other Ambulatory Visit (HOSPITAL_COMMUNITY)
Admission: RE | Admit: 2022-05-16 | Discharge: 2022-05-16 | Disposition: A | Discharge status: Home / Self Care | Payer: Medicaid Other | Source: Ambulatory Visit | Attending: Advanced Practice Midwife | Admitting: Advanced Practice Midwife

## 2022-05-16 ENCOUNTER — Ambulatory Visit (INDEPENDENT_AMBULATORY_CARE_PROVIDER_SITE_OTHER): Payer: Medicaid Other | Admitting: Obstetrics and Gynecology

## 2022-05-16 ENCOUNTER — Encounter: Payer: Medicaid Other | Admitting: Advanced Practice Midwife

## 2022-05-16 VITALS — BP 108/59 | HR 77 | Wt 159.0 lb

## 2022-05-16 DIAGNOSIS — Z3A25 25 weeks gestation of pregnancy: Secondary | ICD-10-CM

## 2022-05-16 DIAGNOSIS — Z3482 Encounter for supervision of other normal pregnancy, second trimester: Secondary | ICD-10-CM | POA: Insufficient documentation

## 2022-05-16 NOTE — Progress Notes (Signed)
Patient [redacted]w[redacted]d presents for ROB. The only concern she has today is that sometimes her left leg feels numb. I explained to her that this is probably baby positioning, but will inform the doctor. No other questions or concerns.

## 2022-05-16 NOTE — Progress Notes (Signed)
    PRENATAL VISIT NOTE  Subjective:  Tammie Martinez is a 31 y.o. [phone] at [redacted]w[redacted]d being seen today for ongoing prenatal care.  She is currently monitored for the following issues for this low-risk pregnancy and has Vitamin D deficiency; Lesion of left nipple; Supervision of normal pregnancy; and Alpha thalassemia silent carrier on their problem list.  Patient reports no complaints.  Contractions: Not present. Vag. Bleeding: None.  Movement: Present. Denies leaking of fluid.   The following portions of the patient's history were reviewed and updated as appropriate: allergies, current medications, past family history, past medical history, past social history, past surgical history and problem list.   Objective:   Vitals:   05/16/22 0904  BP: (!) 108/59  Pulse: 77  Weight: 159 lb (72.1 kg)    Fetal Status: Fetal Heart Rate (bpm): 145 Fundal Height: 26 cm Movement: Present     General:  Alert, oriented and cooperative. Patient is in no acute distress.  Skin: Skin is warm and dry. No rash noted.   Cardiovascular: Normal heart rate noted  Respiratory: Normal respiratory effort, no problems with respiration noted  Abdomen: Soft, gravid, appropriate for gestational age.  Pain/Pressure: Absent     Pelvic: Cervical exam deferred        Extremities: Normal range of motion.  Edema: None  Mental Status: Normal mood and affect. Normal behavior. Normal judgment and thought content.   Assessment and Plan:  Pregnancy: [id number] at [redacted]w[redacted]d 1. Encounter for supervision of other normal pregnancy in second trimester Patient is doing well without complaints Third trimester labs next visit  Follow up anatomy ultrasound 9/11  Preterm labor symptoms and general obstetric precautions including but not limited to vaginal bleeding, contractions, leaking of fluid and fetal movement were reviewed in detail with the patient. Please refer to After Visit Summary for other counseling recommendations.   Return  in about 4 weeks (around 06/13/2022) for in person, ROB, Low risk, 2 hr glucola next visit.  Future Appointments  Date Time Provider Department Center  05/30/2022  9:15 AM [facility] NURSE [facility]  05/30/2022  9:30 AM [facility] US3 [facility]    Catalina Antigua, MD

## 2022-05-17 LAB — CERVICOVAGINAL ANCILLARY ONLY
Bacterial Vaginitis (gardnerella): POSITIVE — AB
Candida Glabrata: NEGATIVE
Candida Vaginitis: POSITIVE — AB
Chlamydia: NEGATIVE
Comment: NEGATIVE
Comment: NEGATIVE
Comment: NEGATIVE
Comment: NEGATIVE
Comment: NORMAL
Neisseria Gonorrhea: NEGATIVE

## 2022-05-18 MED ORDER — TERCONAZOLE 0.8 % VA CREA: 1.0000 | TOPICAL_CREAM | Freq: Every day | VAGINAL | 0 refills | Status: DC

## 2022-05-18 MED ORDER — METRONIDAZOLE 500 MG PO TABS: 500.0000 mg | ORAL_TABLET | Freq: Two times a day (BID) | ORAL | 0 refills | Status: DC

## 2022-05-18 NOTE — Addendum Note (Signed)
Addended by: Catalina Antigua on: 05/18/2022 08:01 AM   Modules accepted: Orders

## 2022-05-30 ENCOUNTER — Encounter: Payer: Self-pay | Admitting: *Deleted

## 2022-05-30 ENCOUNTER — Ambulatory Visit: Payer: Medicaid Other | Attending: Obstetrics

## 2022-05-30 ENCOUNTER — Ambulatory Visit: Payer: Medicaid Other | Admitting: *Deleted

## 2022-05-30 ENCOUNTER — Other Ambulatory Visit: Payer: Self-pay | Admitting: *Deleted

## 2022-05-30 DIAGNOSIS — O359XX0 Maternal care for (suspected) fetal abnormality and damage, unspecified, not applicable or unspecified: Secondary | ICD-10-CM

## 2022-05-30 DIAGNOSIS — O283 Abnormal ultrasonic finding on antenatal screening of mother: Secondary | ICD-10-CM | POA: Diagnosis not present

## 2022-05-30 DIAGNOSIS — Z362 Encounter for other antenatal screening follow-up: Secondary | ICD-10-CM

## 2022-05-30 DIAGNOSIS — Z3A27 27 weeks gestation of pregnancy: Secondary | ICD-10-CM

## 2022-06-01 ENCOUNTER — Encounter: Payer: Self-pay | Admitting: Obstetrics and Gynecology

## 2022-06-01 ENCOUNTER — Other Ambulatory Visit: Payer: Medicaid Other

## 2022-06-01 ENCOUNTER — Ambulatory Visit (INDEPENDENT_AMBULATORY_CARE_PROVIDER_SITE_OTHER): Payer: Medicaid Other | Admitting: Obstetrics and Gynecology

## 2022-06-01 VITALS — BP 101/61 | HR 71 | Wt 160.1 lb

## 2022-06-01 DIAGNOSIS — Z3A27 27 weeks gestation of pregnancy: Secondary | ICD-10-CM | POA: Diagnosis not present

## 2022-06-01 DIAGNOSIS — D563 Thalassemia minor: Secondary | ICD-10-CM

## 2022-06-01 DIAGNOSIS — Z3482 Encounter for supervision of other normal pregnancy, second trimester: Secondary | ICD-10-CM

## 2022-06-01 DIAGNOSIS — Z3481 Encounter for supervision of other normal pregnancy, first trimester: Secondary | ICD-10-CM

## 2022-06-01 MED ORDER — BLOOD PRESSURE KIT DEVI: 1.0000 | 0 refills | Status: DC

## 2022-06-01 NOTE — Progress Notes (Signed)
Pt presents for ROB visit. No concerns at this time.  

## 2022-06-01 NOTE — Progress Notes (Signed)
   PRENATAL VISIT NOTE  Subjective:  Tammie Martinez is a 31 y.o. [phone] at [redacted]w[redacted]d being seen today for ongoing prenatal care.  She is currently monitored for the following issues for this low-risk pregnancy and has Vitamin D deficiency; Lesion of left nipple; Supervision of normal pregnancy; and Alpha thalassemia silent carrier on their problem list.  Patient doing well with no acute concerns today. She reports  intermittent mild peri-epigastric pain .  Contractions: Irritability. Vag. Bleeding: None.  Movement: Present. Denies leaking of fluid.   The following portions of the patient's history were reviewed and updated as appropriate: allergies, current medications, past family history, past medical history, past social history, past surgical history and problem list. Problem list updated.  Objective:   Vitals:   06/01/22 0956  BP: 101/61  Pulse: 71  Weight: 160 lb 1.6 oz (72.6 kg)    Fetal Status: Fetal Heart Rate (bpm): 141 Fundal Height: 29 cm Movement: Present     General:  Alert, oriented and cooperative. Patient is in no acute distress.  Skin: Skin is warm and dry. No rash noted.   Cardiovascular: Normal heart rate noted  Respiratory: Normal respiratory effort, no problems with respiration noted  Abdomen: Soft, gravid, appropriate for gestational age.  Pain/Pressure: Present     Pelvic: Cervical exam deferred        Extremities: Normal range of motion.  Edema: Trace  Mental Status:  Normal mood and affect. Normal behavior. Normal judgment and thought content.   Assessment and Plan:  Pregnancy: [id number] at [redacted]w[redacted]d  1. Encounter for supervision of other normal pregnancy in second trimester Continue routine prenatal care Increased AFI noted on last scan Pt followed with serial growth scans and AFI checks Advised belly band for discomfort  - Glucose Tolerance, 2 Hours w/1 Hour - RPR - HIV antibody (with reflex) - CBC - Tdap vaccine greater than or equal to 7yo IM - Blood  Pressure Monitoring (BLOOD PRESSURE KIT) DEVI; 1 kit by Does not apply route once a week.  Dispense: 1 each; Refill: 0  2. [redacted] weeks gestation of pregnancy   3. Alpha thalassemia silent carrier   Preterm labor symptoms and general obstetric precautions including but not limited to vaginal bleeding, contractions, leaking of fluid and fetal movement were reviewed in detail with the patient.  Please refer to After Visit Summary for other counseling recommendations.   Return in about 2 weeks (around 06/15/2022) for ROB, in person.   Lynnda Shields, MD Faculty Attending [facility]

## 2022-06-02 LAB — GLUCOSE TOLERANCE, 2 HOURS W/ 1HR
Glucose, 1 hour: 165 mg/dL (ref 70–179)
Glucose, 2 hour: 120 mg/dL (ref 70–152)
Glucose, Fasting: 91 mg/dL (ref 70–91)

## 2022-06-02 LAB — CBC
Hematocrit: 30 % — ABNORMAL LOW (ref 34.0–46.6)
Hemoglobin: 9.7 g/dL — ABNORMAL LOW (ref 11.1–15.9)
MCH: 28.2 pg (ref 26.6–33.0)
MCHC: 32.3 g/dL (ref 31.5–35.7)
MCV: 87 fL (ref 79–97)
Platelets: 271 10*3/uL (ref 150–450)
RBC: 3.44 x10E6/uL — ABNORMAL LOW (ref 3.77–5.28)
RDW: 12.1 % (ref 11.7–15.4)
WBC: 11.3 10*3/uL — ABNORMAL HIGH (ref 3.4–10.8)

## 2022-06-02 LAB — RPR: RPR Ser Ql: NONREACTIVE

## 2022-06-02 LAB — HIV ANTIBODY (ROUTINE TESTING W REFLEX): HIV Screen 4th Generation wRfx: NONREACTIVE

## 2022-06-13 ENCOUNTER — Other Ambulatory Visit: Payer: Medicaid Other

## 2022-06-13 ENCOUNTER — Encounter: Payer: Self-pay | Admitting: Advanced Practice Midwife

## 2022-06-15 ENCOUNTER — Ambulatory Visit (INDEPENDENT_AMBULATORY_CARE_PROVIDER_SITE_OTHER): Payer: Medicaid Other | Admitting: Obstetrics and Gynecology

## 2022-06-15 ENCOUNTER — Encounter: Payer: Self-pay | Admitting: Obstetrics and Gynecology

## 2022-06-15 VITALS — BP 102/63 | HR 79 | Wt 162.0 lb

## 2022-06-15 DIAGNOSIS — Z3A29 29 weeks gestation of pregnancy: Secondary | ICD-10-CM

## 2022-06-15 DIAGNOSIS — Z3483 Encounter for supervision of other normal pregnancy, third trimester: Secondary | ICD-10-CM

## 2022-06-15 MED ORDER — FERROUS SULFATE 325 (65 FE) MG PO TABS: ORAL_TABLET | ORAL | 1 refills | Status: DC

## 2022-06-15 NOTE — Progress Notes (Signed)
Pt states she is having upper back pain, makes it hard to sleep. Pt complains of Braxton Hicks ctx- irregular.

## 2022-06-15 NOTE — Progress Notes (Signed)
Subjective:  Tammie Martinez is a 31 y.o. [phone] at [redacted]w[redacted]d being seen today for ongoing prenatal care.  She is currently monitored for the following issues for this low-risk pregnancy and has Vitamin D deficiency; Lesion of left nipple; Supervision of normal pregnancy; and Alpha thalassemia silent carrier on their problem list.  Patient reports general discomforts of pregnancy.  Contractions: Irregular. Vag. Bleeding: None.  Movement: Present. Denies leaking of fluid.   The following portions of the patient's history were reviewed and updated as appropriate: allergies, current medications, past family history, past medical history, past social history, past surgical history and problem list. Problem list updated.  Objective:   Vitals:   06/15/22 0849  BP: 102/63  Pulse: 79  Weight: 162 lb (73.5 kg)    Fetal Status: Fetal Heart Rate (bpm): 155   Movement: Present     General:  Alert, oriented and cooperative. Patient is in no acute distress.  Skin: Skin is warm and dry. No rash noted.   Cardiovascular: Normal heart rate noted  Respiratory: Normal respiratory effort, no problems with respiration noted  Abdomen: Soft, gravid, appropriate for gestational age. Pain/Pressure: Present     Pelvic:  Cervical exam deferred        Extremities: Normal range of motion.     Mental Status: Normal mood and affect. Normal behavior. Normal judgment and thought content.   Urinalysis:      Assessment and Plan:  Pregnancy: [id number] at [redacted]w[redacted]d  1. Encounter for supervision of other normal pregnancy in third trimester Stable - ferrous sulfate (FERROUSUL) 325 (65 FE) MG tablet; Take 1 tablet every other day  Dispense: 60 tablet; Refill: 1  Preterm labor symptoms and general obstetric precautions including but not limited to vaginal bleeding, contractions, leaking of fluid and fetal movement were reviewed in detail with the patient. Please refer to After Visit Summary for other counseling recommendations.   Return in about 2 weeks (around 06/29/2022) for OB visit, face to face, any provider.   Chancy Milroy, MD

## 2022-06-15 NOTE — Patient Instructions (Signed)

## 2022-06-27 ENCOUNTER — Other Ambulatory Visit: Payer: Self-pay | Admitting: *Deleted

## 2022-06-27 ENCOUNTER — Ambulatory Visit: Payer: Medicaid Other | Attending: Maternal & Fetal Medicine

## 2022-06-27 ENCOUNTER — Encounter: Payer: Self-pay | Admitting: *Deleted

## 2022-06-27 ENCOUNTER — Ambulatory Visit: Payer: Medicaid Other | Admitting: *Deleted

## 2022-06-27 DIAGNOSIS — Z362 Encounter for other antenatal screening follow-up: Secondary | ICD-10-CM | POA: Diagnosis not present

## 2022-06-27 DIAGNOSIS — O403XX0 Polyhydramnios, third trimester, not applicable or unspecified: Secondary | ICD-10-CM

## 2022-06-27 DIAGNOSIS — D563 Thalassemia minor: Secondary | ICD-10-CM

## 2022-06-28 ENCOUNTER — Encounter: Payer: Self-pay | Admitting: Obstetrics and Gynecology

## 2022-06-28 ENCOUNTER — Ambulatory Visit (INDEPENDENT_AMBULATORY_CARE_PROVIDER_SITE_OTHER): Payer: Medicaid Other | Admitting: Obstetrics and Gynecology

## 2022-06-28 VITALS — BP 118/51 | HR 72 | Wt 168.0 lb

## 2022-06-28 DIAGNOSIS — O403XX1 Polyhydramnios, third trimester, fetus 1: Secondary | ICD-10-CM

## 2022-06-28 DIAGNOSIS — Z3A31 31 weeks gestation of pregnancy: Secondary | ICD-10-CM

## 2022-06-28 DIAGNOSIS — Z3483 Encounter for supervision of other normal pregnancy, third trimester: Secondary | ICD-10-CM

## 2022-06-28 DIAGNOSIS — O409XX0 Polyhydramnios, unspecified trimester, not applicable or unspecified: Secondary | ICD-10-CM

## 2022-06-28 NOTE — Progress Notes (Signed)
MFM growth u/s completed yesterday. She has questions regarding EDD.

## 2022-06-28 NOTE — Progress Notes (Signed)
   PRENATAL VISIT NOTE  Subjective:  Tammie Martinez is a 31 y.o. [phone] at [redacted]w[redacted]d being seen today for ongoing prenatal care.  She is currently monitored for the following issues for this low-risk pregnancy and has Vitamin D deficiency; Lesion of left nipple; Supervision of normal pregnancy; and Alpha thalassemia silent carrier on their problem list.  Patient reports no bleeding, no cramping, no leaking, and braxton hicks contractions .  Contractions: Irritability. Vag. Bleeding: None.  Movement: Present. Denies leaking of fluid.   The following portions of the patient's history were reviewed and updated as appropriate: allergies, current medications, past family history, past medical history, past social history, past surgical history and problem list.   Objective:   Vitals:   06/28/22 1016  BP: (!) 118/51  Pulse: 72  Weight: 168 lb (76.2 kg)    Fetal Status: Fetal Heart Rate (bpm): 144 Fundal Height: 32 cm Movement: Present     General:  Alert, oriented and cooperative. Patient is in no acute distress.  Skin: Skin is warm and dry. No rash noted.   Cardiovascular: Normal heart rate noted  Respiratory: Normal respiratory effort, no problems with respiration noted  Abdomen: Soft, gravid, appropriate for gestational age.  Pain/Pressure: Present     Pelvic: Cervical exam deferred        Extremities: Normal range of motion.  Edema: Trace  Mental Status: Normal mood and affect. Normal behavior. Normal judgment and thought content.   Assessment and Plan:  Pregnancy: [id number] at [redacted]w[redacted]d 1. Encounter for supervision of other normal pregnancy in third trimester Normal fetal heart rate and fundal height Reviewed third tri labs   2. [redacted] weeks gestation of pregnancy Third trimester labs within normal limits   3. Polyhydramnios affecting pregnancy Reviewed preterm labor signs Follow MFM recommendations regarding surveillance until delivery    Preterm labor symptoms and general obstetric  precautions including but not limited to vaginal bleeding, contractions, leaking of fluid and fetal movement were reviewed in detail with the patient. Please refer to After Visit Summary for other counseling recommendations.   No follow-ups on file.  Future Appointments  Date Time Provider [place]  07/12/2022  9:55 AM Elvera Maria, CNM [facility] None  07/18/2022 10:15 AM [facility] NURSE [facility]  07/18/2022 10:30 AM [facility]2 [facility]  07/25/2022  9:15 AM [facility] NURSE [facility]  07/25/2022  9:30 AM [facility] US3 [facility]  08/01/2022  8:30 AM [facility] NURSE [facility]  08/01/2022  8:45 AM [facility] US4 [facility] [place]    Darliss Cheney, MD 06/28/22

## 2022-07-12 ENCOUNTER — Ambulatory Visit (INDEPENDENT_AMBULATORY_CARE_PROVIDER_SITE_OTHER): Payer: Medicaid Other | Admitting: Advanced Practice Midwife

## 2022-07-12 ENCOUNTER — Other Ambulatory Visit (HOSPITAL_COMMUNITY)
Admission: RE | Admit: 2022-07-12 | Discharge: 2022-07-12 | Disposition: A | Discharge status: Home / Self Care | Payer: Medicaid Other | Source: Ambulatory Visit | Attending: Advanced Practice Midwife | Admitting: Advanced Practice Midwife

## 2022-07-12 ENCOUNTER — Encounter: Payer: Self-pay | Admitting: Advanced Practice Midwife

## 2022-07-12 VITALS — BP 105/65 | HR 96 | Wt 170.8 lb

## 2022-07-12 DIAGNOSIS — B3731 Acute candidiasis of vulva and vagina: Secondary | ICD-10-CM | POA: Diagnosis not present

## 2022-07-12 DIAGNOSIS — O409XX0 Polyhydramnios, unspecified trimester, not applicable or unspecified: Secondary | ICD-10-CM | POA: Insufficient documentation

## 2022-07-12 DIAGNOSIS — O479 False labor, unspecified: Secondary | ICD-10-CM

## 2022-07-12 DIAGNOSIS — Z3A33 33 weeks gestation of pregnancy: Secondary | ICD-10-CM

## 2022-07-12 DIAGNOSIS — Z3483 Encounter for supervision of other normal pregnancy, third trimester: Secondary | ICD-10-CM

## 2022-07-12 DIAGNOSIS — Z0371 Encounter for suspected problem with amniotic cavity and membrane ruled out: Secondary | ICD-10-CM

## 2022-07-12 MED ORDER — FLUCONAZOLE 150 MG PO TABS: ORAL_TABLET | ORAL | 0 refills | Status: DC

## 2022-07-12 NOTE — Progress Notes (Signed)
Patient presents for Tammie Martinez visit. Pt complains of yeast symptoms and leakage of fluid. Pt states the leaking of fluid has been happening "for awhile now". No other concerns at this time.

## 2022-07-12 NOTE — Progress Notes (Signed)
   PRENATAL VISIT NOTE  Subjective:  Tammie Martinez is a 31 y.o. [phone] at [redacted]w[redacted]d being seen today for ongoing prenatal care.  She is currently monitored for the following issues for this low-risk pregnancy and has Vitamin D deficiency; Lesion of left nipple; Supervision of normal pregnancy; Alpha thalassemia silent carrier; and Polyhydramnios affecting pregnancy on their problem list.  Patient reports occasional contractions.  Contractions: Irritability. Vag. Bleeding: None.  Movement: Present. Denies leaking of fluid.   The following portions of the patient's history were reviewed and updated as appropriate: allergies, current medications, past family history, past medical history, past social history, past surgical history and problem list.   Objective:   Vitals:   07/12/22 1007  BP: 105/65  Pulse: 96  Weight: 170 lb 12.8 oz (77.5 kg)    Fetal Status: Fetal Heart Rate (bpm): 142 Fundal Height: 34 cm Movement: Present     General:  Alert, oriented and cooperative. Patient is in no acute distress.  Skin: Skin is warm and dry. No rash noted.   Cardiovascular: Normal heart rate noted  Respiratory: Normal respiratory effort, no problems with respiration noted  Abdomen: Soft, gravid, appropriate for gestational age.  Pain/Pressure: Present     Pelvic: Cervical exam deferred . SSE with negative pooling/ferning and cervix visually closed/long       Extremities: Normal range of motion.  Edema: Trace  Mental Status: Normal mood and affect. Normal behavior. Normal judgment and thought content.   Assessment and Plan:  Pregnancy: [id number] at [redacted]w[redacted]d 1. Encounter for supervision of other normal pregnancy in third trimester --Anticipatory guidance about next visits/weeks of pregnancy given.   2. Polyhydramnios affecting pregnancy --F/U US scheudled  3. [redacted] weeks gestation of pregnancy   4. Yeast infection of the vagina  - Cervicovaginal ancillary only  5. Vaginal candidiasis --clinical  evidence of yeast infection on exam today- - fluconazole (DIFLUCAN) 150 MG tablet; Take one tablet now and one tablet in 72 hours (3 days).  Dispense: 2 tablet; Refill: 0  6. Encounter for suspected premature rupture of amniotic membranes, with rupture of membranes not found   --Ferning and pooling negative on SSE --Evidence of yeast, see above   7. 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  --Rest/ice/heat/warm bath/increase PO fluids/Tylenol/pregnancy support belt   --Cervix visually closed/long today on SSE --May be related to polyhydramnios --PTL precautions reviewed  Preterm labor symptoms and general obstetric precautions including but not limited to vaginal bleeding, contractions, leaking of fluid and fetal movement were reviewed in detail with the patient. Please refer to After Visit Summary for other counseling recommendations.   Return in about 2 weeks (around 07/26/2022) for LOB, Any provider.  Future Appointments  Date Time Provider [place]  07/18/2022 10:15 AM [facility] NURSE [facility]  07/18/2022 10:30 AM [facility] US2 [facility]  07/25/2022  9:15 AM [facility] NURSE [facility]  07/25/2022  9:30 AM [facility]  07/28/2022  9:35 AM Shelly Bombard, MD [facility] None  08/01/2022  8:30 AM [facility] NURSE [facility]  08/01/2022  8:45 AM [facility] US4 [facility] [place]    Fatima Blank, CNM

## 2022-07-13 LAB — CERVICOVAGINAL ANCILLARY ONLY
Bacterial Vaginitis (gardnerella): POSITIVE — AB
Candida Glabrata: NEGATIVE
Candida Vaginitis: POSITIVE — AB
Comment: NEGATIVE
Comment: NEGATIVE
Comment: NEGATIVE

## 2022-07-18 ENCOUNTER — Ambulatory Visit: Payer: Medicaid Other | Attending: Obstetrics

## 2022-07-18 ENCOUNTER — Ambulatory Visit: Payer: Medicaid Other | Admitting: *Deleted

## 2022-07-18 VITALS — BP 116/56 | HR 69

## 2022-07-18 DIAGNOSIS — Z148 Genetic carrier of other disease: Secondary | ICD-10-CM | POA: Diagnosis not present

## 2022-07-18 DIAGNOSIS — D563 Thalassemia minor: Secondary | ICD-10-CM | POA: Diagnosis not present

## 2022-07-18 DIAGNOSIS — O359XX0 Maternal care for (suspected) fetal abnormality and damage, unspecified, not applicable or unspecified: Secondary | ICD-10-CM

## 2022-07-18 DIAGNOSIS — Z362 Encounter for other antenatal screening follow-up: Secondary | ICD-10-CM | POA: Diagnosis not present

## 2022-07-18 DIAGNOSIS — Z3689 Encounter for other specified antenatal screening: Secondary | ICD-10-CM

## 2022-07-18 DIAGNOSIS — Z3A34 34 weeks gestation of pregnancy: Secondary | ICD-10-CM

## 2022-07-18 DIAGNOSIS — O403XX0 Polyhydramnios, third trimester, not applicable or unspecified: Secondary | ICD-10-CM | POA: Diagnosis not present

## 2022-07-21 ENCOUNTER — Other Ambulatory Visit: Payer: Self-pay | Admitting: *Deleted

## 2022-07-21 DIAGNOSIS — N760 Acute vaginitis: Secondary | ICD-10-CM

## 2022-07-21 MED ORDER — METRONIDAZOLE 500 MG PO TABS: 500.0000 mg | ORAL_TABLET | Freq: Two times a day (BID) | ORAL | 0 refills | Status: DC

## 2022-07-21 NOTE — Progress Notes (Signed)
TC from pt reporting now symptomatic of BV on noted on swab and requesting RX. RX Flagyl per protocol.

## 2022-07-25 ENCOUNTER — Ambulatory Visit (HOSPITAL_BASED_OUTPATIENT_CLINIC_OR_DEPARTMENT_OTHER): Payer: Medicaid Other

## 2022-07-25 ENCOUNTER — Ambulatory Visit: Payer: Medicaid Other | Attending: Maternal & Fetal Medicine | Admitting: *Deleted

## 2022-07-25 ENCOUNTER — Other Ambulatory Visit: Payer: Self-pay | Admitting: *Deleted

## 2022-07-25 VITALS — BP 107/47 | HR 58

## 2022-07-25 DIAGNOSIS — Z3A35 35 weeks gestation of pregnancy: Secondary | ICD-10-CM | POA: Insufficient documentation

## 2022-07-25 DIAGNOSIS — D563 Thalassemia minor: Secondary | ICD-10-CM | POA: Diagnosis not present

## 2022-07-25 DIAGNOSIS — O409XX0 Polyhydramnios, unspecified trimester, not applicable or unspecified: Secondary | ICD-10-CM

## 2022-07-25 DIAGNOSIS — O285 Abnormal chromosomal and genetic finding on antenatal screening of mother: Secondary | ICD-10-CM

## 2022-07-25 DIAGNOSIS — O99013 Anemia complicating pregnancy, third trimester: Secondary | ICD-10-CM | POA: Insufficient documentation

## 2022-07-25 DIAGNOSIS — O403XX0 Polyhydramnios, third trimester, not applicable or unspecified: Secondary | ICD-10-CM

## 2022-07-25 DIAGNOSIS — Z3A25 25 weeks gestation of pregnancy: Secondary | ICD-10-CM | POA: Diagnosis not present

## 2022-07-25 DIAGNOSIS — O359XX0 Maternal care for (suspected) fetal abnormality and damage, unspecified, not applicable or unspecified: Secondary | ICD-10-CM | POA: Insufficient documentation

## 2022-07-28 ENCOUNTER — Ambulatory Visit (INDEPENDENT_AMBULATORY_CARE_PROVIDER_SITE_OTHER): Payer: Medicaid Other | Admitting: Obstetrics

## 2022-07-28 ENCOUNTER — Encounter: Payer: Self-pay | Admitting: Obstetrics

## 2022-07-28 VITALS — BP 105/69 | HR 106 | Wt 168.0 lb

## 2022-07-28 DIAGNOSIS — O0990 Supervision of high risk pregnancy, unspecified, unspecified trimester: Secondary | ICD-10-CM

## 2022-07-28 DIAGNOSIS — Z3A35 35 weeks gestation of pregnancy: Secondary | ICD-10-CM

## 2022-07-28 DIAGNOSIS — O403XX0 Polyhydramnios, third trimester, not applicable or unspecified: Secondary | ICD-10-CM

## 2022-07-28 DIAGNOSIS — O0993 Supervision of high risk pregnancy, unspecified, third trimester: Secondary | ICD-10-CM

## 2022-07-28 DIAGNOSIS — O409XX0 Polyhydramnios, unspecified trimester, not applicable or unspecified: Secondary | ICD-10-CM

## 2022-07-28 NOTE — Progress Notes (Signed)
Subjective:  Tammie Martinez is a 31 y.o. [phone] at [redacted]w[redacted]d being seen today for ongoing prenatal care.  She is currently monitored for the following issues for this high-risk pregnancy and has Vitamin D deficiency; Lesion of left nipple; Supervision of normal pregnancy; Alpha thalassemia silent carrier; and Polyhydramnios affecting pregnancy on their problem list.  Patient reports no complaints.  Contractions: Irritability. Vag. Bleeding: None.  Movement: Present. Denies leaking of fluid.   The following portions of the patient's history were reviewed and updated as appropriate: allergies, current medications, past family history, past medical history, past social history, past surgical history and problem list. Problem list updated.  Objective:   Vitals:   07/28/22 1005  BP: 105/69  Pulse: (!) 106  Weight: 168 lb (76.2 kg)    Fetal Status: Fetal Heart Rate (bpm): 148   Movement: Present     General:  Alert, oriented and cooperative. Patient is in no acute distress.  Skin: Skin is warm and dry. No rash noted.   Cardiovascular: Normal heart rate noted  Respiratory: Normal respiratory effort, no problems with respiration noted  Abdomen: Soft, gravid, appropriate for gestational age. Pain/Pressure: Present     Pelvic:  Cervical exam deferred        Extremities: Normal range of motion.  Edema: Mild pitting, slight indentation  Mental Status: Normal mood and affect. Normal behavior. Normal judgment and thought content.   Urinalysis:      Assessment and Plan:  Pregnancy: [id number] at [redacted]w[redacted]d  1. Supervision of high risk pregnancy, antepartum  2. Polyhydramnios affecting pregnancy - weekly BPP's, interval growth U/S's every 4 weeks - delivery at 39 weeks   Preterm labor symptoms and general obstetric precautions including but not limited to vaginal bleeding, contractions, leaking of fluid and fetal movement were reviewed in detail with the patient. Please refer to After Visit Summary for  other counseling recommendations.   Return in about 1 week (around 08/04/2022) for [facility].   Brock Bad, MD 07/28/22

## 2022-07-28 NOTE — Progress Notes (Signed)
Pt presents for ROB. Next BPP scheduled next week.

## 2022-08-01 ENCOUNTER — Ambulatory Visit: Payer: Medicaid Other | Admitting: *Deleted

## 2022-08-01 ENCOUNTER — Ambulatory Visit: Payer: Medicaid Other | Attending: Obstetrics

## 2022-08-01 VITALS — BP 107/48 | HR 69

## 2022-08-01 DIAGNOSIS — D563 Thalassemia minor: Secondary | ICD-10-CM | POA: Insufficient documentation

## 2022-08-01 DIAGNOSIS — O409XX0 Polyhydramnios, unspecified trimester, not applicable or unspecified: Secondary | ICD-10-CM | POA: Diagnosis not present

## 2022-08-01 DIAGNOSIS — O403XX0 Polyhydramnios, third trimester, not applicable or unspecified: Secondary | ICD-10-CM | POA: Insufficient documentation

## 2022-08-04 ENCOUNTER — Encounter: Payer: Self-pay | Admitting: Obstetrics

## 2022-08-04 ENCOUNTER — Other Ambulatory Visit (HOSPITAL_COMMUNITY)
Admission: RE | Admit: 2022-08-04 | Discharge: 2022-08-04 | Disposition: A | Discharge status: Home / Self Care | Payer: Medicaid Other | Source: Ambulatory Visit | Attending: Obstetrics | Admitting: Obstetrics

## 2022-08-04 ENCOUNTER — Ambulatory Visit (INDEPENDENT_AMBULATORY_CARE_PROVIDER_SITE_OTHER): Payer: Medicaid Other | Admitting: Obstetrics

## 2022-08-04 VITALS — BP 113/51 | HR 77 | Wt 180.0 lb

## 2022-08-04 DIAGNOSIS — Z3A36 36 weeks gestation of pregnancy: Secondary | ICD-10-CM

## 2022-08-04 DIAGNOSIS — Z3483 Encounter for supervision of other normal pregnancy, third trimester: Secondary | ICD-10-CM | POA: Insufficient documentation

## 2022-08-04 DIAGNOSIS — B3789 Other sites of candidiasis: Secondary | ICD-10-CM

## 2022-08-04 MED ORDER — CLOTRIMAZOLE 1 % EX CREA: 1.0000 | TOPICAL_CREAM | Freq: Two times a day (BID) | CUTANEOUS | 0 refills | Status: DC

## 2022-08-04 NOTE — Progress Notes (Signed)
Subjective:  Tammie Martinez is a 31 y.o. [phone] at [redacted]w[redacted]d being seen today for ongoing prenatal care.  She is currently monitored for the following issues for this low-risk pregnancy and has Vitamin D deficiency; Lesion of left nipple; Supervision of normal pregnancy; Alpha thalassemia silent carrier; and Polyhydramnios affecting pregnancy on their problem list.  Patient reports no complaints.  Contractions: Irregular. Vag. Bleeding: None.  Movement: Present. Denies leaking of fluid.   The following portions of the patient's history were reviewed and updated as appropriate: allergies, current medications, past family history, past medical history, past social history, past surgical history and problem list. Problem list updated.  Objective:   Vitals:   08/04/22 1011  BP: (!) 113/51  Pulse: 77  Weight: 180 lb (81.6 kg)    Fetal Status: Fetal Heart Rate (bpm): 145   Movement: Present     General:  Alert, oriented and cooperative. Patient is in no acute distress.  Skin: Skin is warm and dry. No rash noted.   Cardiovascular: Normal heart rate noted  Respiratory: Normal respiratory effort, no problems with respiration noted  Abdomen: Soft, gravid, appropriate for gestational age. Pain/Pressure: Present     Pelvic:  Cervical exam deferred        Extremities: Normal range of motion.  Edema: None  Mental Status: Normal mood and affect. Normal behavior. Normal judgment and thought content.   Urinalysis:      Assessment and Plan:  Pregnancy: [id number] at [redacted]w[redacted]d  1. Encounter for supervision of other normal pregnancy in third trimester Rx: - Culture, beta strep (group b only) - Cervicovaginal ancillary only( [place])  2. Candidiasis of breast Rx: - clotrimazole (LOTRIMIN) 1 % cream; Apply 1 Application topically 2 (two) times daily.  Dispense: 30 g; Refill: 0    Preterm labor symptoms and general obstetric precautions including but not limited to vaginal bleeding, contractions, leaking  of fluid and fetal movement were reviewed in detail with the patient. Please refer to After Visit Summary for other counseling recommendations.   Return in about 1 week (around 08/11/2022) for ROB.   Brock Bad, MD 08/04/22

## 2022-08-04 NOTE — Progress Notes (Signed)
ROB/GBS. C/o LOF  x 2 weeks.  Pt requested RX for yeast.

## 2022-08-05 LAB — CERVICOVAGINAL ANCILLARY ONLY
Bacterial Vaginitis (gardnerella): NEGATIVE
Candida Glabrata: NEGATIVE
Candida Vaginitis: POSITIVE — AB
Chlamydia: NEGATIVE
Comment: NEGATIVE
Comment: NEGATIVE
Comment: NEGATIVE
Comment: NEGATIVE
Comment: NEGATIVE
Comment: NORMAL
Neisseria Gonorrhea: NEGATIVE
Trichomonas: NEGATIVE

## 2022-08-08 ENCOUNTER — Ambulatory Visit: Payer: Medicaid Other | Attending: Maternal & Fetal Medicine

## 2022-08-08 ENCOUNTER — Encounter (HOSPITAL_COMMUNITY): Payer: Self-pay | Admitting: Family Medicine

## 2022-08-08 ENCOUNTER — Ambulatory Visit: Payer: Medicaid Other | Admitting: *Deleted

## 2022-08-08 ENCOUNTER — Inpatient Hospital Stay (HOSPITAL_COMMUNITY)
Admission: AD | Admit: 2022-08-08 | Discharge: 2022-08-08 | Disposition: A | Discharge status: Home / Self Care | Payer: Medicaid Other | Attending: Family Medicine | Admitting: Family Medicine

## 2022-08-08 VITALS — BP 121/52 | HR 62

## 2022-08-08 DIAGNOSIS — O23593 Infection of other part of genital tract in pregnancy, third trimester: Secondary | ICD-10-CM | POA: Insufficient documentation

## 2022-08-08 DIAGNOSIS — O409XX0 Polyhydramnios, unspecified trimester, not applicable or unspecified: Secondary | ICD-10-CM

## 2022-08-08 DIAGNOSIS — O99891 Other specified diseases and conditions complicating pregnancy: Secondary | ICD-10-CM

## 2022-08-08 DIAGNOSIS — Z148 Genetic carrier of other disease: Secondary | ICD-10-CM

## 2022-08-08 DIAGNOSIS — Z3493 Encounter for supervision of normal pregnancy, unspecified, third trimester: Secondary | ICD-10-CM

## 2022-08-08 DIAGNOSIS — O98813 Other maternal infectious and parasitic diseases complicating pregnancy, third trimester: Secondary | ICD-10-CM | POA: Insufficient documentation

## 2022-08-08 DIAGNOSIS — O359XX0 Maternal care for (suspected) fetal abnormality and damage, unspecified, not applicable or unspecified: Secondary | ICD-10-CM | POA: Diagnosis not present

## 2022-08-08 DIAGNOSIS — Z3A37 37 weeks gestation of pregnancy: Secondary | ICD-10-CM | POA: Diagnosis not present

## 2022-08-08 DIAGNOSIS — Z3689 Encounter for other specified antenatal screening: Secondary | ICD-10-CM

## 2022-08-08 DIAGNOSIS — Z3483 Encounter for supervision of other normal pregnancy, third trimester: Secondary | ICD-10-CM

## 2022-08-08 DIAGNOSIS — O403XX0 Polyhydramnios, third trimester, not applicable or unspecified: Secondary | ICD-10-CM | POA: Diagnosis not present

## 2022-08-08 DIAGNOSIS — O471 False labor at or after 37 completed weeks of gestation: Secondary | ICD-10-CM | POA: Insufficient documentation

## 2022-08-08 DIAGNOSIS — B3731 Acute candidiasis of vulva and vagina: Secondary | ICD-10-CM

## 2022-08-08 LAB — URINALYSIS, ROUTINE W REFLEX MICROSCOPIC
Bilirubin Urine: NEGATIVE
Glucose, UA: NEGATIVE mg/dL
Hgb urine dipstick: NEGATIVE
Ketones, ur: NEGATIVE mg/dL
Nitrite: NEGATIVE
Protein, ur: NEGATIVE mg/dL
Specific Gravity, Urine: 1.019 (ref 1.005–1.030)
pH: 6 (ref 5.0–8.0)

## 2022-08-08 LAB — POCT FERN TEST: POCT Fern Test: NEGATIVE

## 2022-08-08 LAB — CULTURE, BETA STREP (GROUP B ONLY): Strep Gp B Culture: NEGATIVE

## 2022-08-08 NOTE — MAU Provider Note (Signed)
History     CSN: [id number]  Arrival date and time: 08/08/22 2142   None     Chief Complaint  Patient presents with  . Rupture of Membranes  . Contractions   HPI Tammie Martinez is a 31 y.o. [id number] at 2w1dwho presents to MAU for ***.    OB History     Gravida  6   Para  2   Term  2   Preterm      AB  3   Living  2      SAB  3   IAB      Ectopic      Multiple  0   Live Births  2           Past Medical History:  Diagnosis Date  . Medical history non-contributory     Past Surgical History:  Procedure Laterality Date  . NO PAST SURGERIES      Family History  Problem Relation Age of Onset  . Healthy Mother   . Heart disease Father   . WYves DillParkinson White syndrome Brother   . Hypertension Maternal Grandfather   . COPD Maternal Grandfather     Social History   Tobacco Use  . Smoking status: Never  . Smokeless tobacco: Never  Vaping Use  . Vaping Use: Never used  Substance Use Topics  . Alcohol use: Not Currently    Alcohol/week: 7.0 standard drinks of alcohol    Types: 7 Glasses of wine per week    Comment: drinks on weekends, not since confirmed pregnancy  . Drug use: Not Currently    Frequency: 7.0 times per week    Types: Marijuana    Comment: Quit smoking    Allergies:  Allergies  Allergen Reactions  . Penicillins Hives    Has patient had a PCN reaction causing immediate rash, facial/tongue/throat swelling, SOB or lightheadedness with hypotension: Yes Has patient had a PCN reaction causing severe rash involving mucus membranes or skin necrosis: Yes Has patient had a PCN reaction that required hospitalization: No Has patient had a PCN reaction occurring within the last 10 years: No If all of the above answers are "NO", then may proceed with Cephalosporin use.   . Sulfa Antibiotics Hives    Medications Prior to Admission  Medication Sig Dispense Refill Last Dose  . Blood Pressure Monitoring (BLOOD PRESSURE KIT) DEVI  1 kit by Does not apply route once a week. 1 each 0   . clotrimazole (LOTRIMIN) 1 % cream Apply 1 Application topically 2 (two) times daily. (Patient not taking: Reported on 08/08/2022) 30 g 0   . ferrous sulfate (FERROUSUL) 325 (65 FE) MG tablet Take 1 tablet every other day 60 tablet 1   . fluconazole (DIFLUCAN) 150 MG tablet Take one tablet now and one tablet in 72 hours (3 days). (Patient not taking: Reported on 07/25/2022) 2 tablet 0   . metroNIDAZOLE (FLAGYL) 500 MG tablet Take 1 tablet (500 mg total) by mouth 2 (two) times daily. (Patient not taking: Reported on 08/08/2022) 14 tablet 0   . Misc. Devices (GOJJI WEIGHT SCALE) MISC 1 Device by Does not apply route every 30 (thirty) days. 1 each 0   . Prenat-Fe Poly-Methfol-FA-DHA (VITAFOL ULTRA) 29-0.6-0.4-200 MG CAPS Take 1 capsule by mouth daily. 30 capsule 11     Review of Systems Physical Exam   Blood pressure 118/64, pulse 95, temperature 98.1 F (36.7 C), temperature source Oral, resp. rate 16, height 5'  4.5" (1.638 m), weight 82.1 kg, last menstrual period 11/14/2021, unknown if currently breastfeeding.  Physical Exam  MAU Course  Procedures  MDM ***  Assessment and Plan  ***  Renee Harder 08/08/2022, 10:42 PM

## 2022-08-08 NOTE — MAU Note (Signed)
Pt says has noticed fluid leaking- but says  she's not SROM- thinks it's urine  - started 2 weeks ago - but today more  Has UC's - strong since 830pm PNC- famina - No VE Denies HSV GBS- neg

## 2022-08-09 ENCOUNTER — Telehealth: Payer: Self-pay | Admitting: Emergency Medicine

## 2022-08-09 NOTE — Telephone Encounter (Signed)
Incoming call from pt stating insurance will not over terazol cream for yeast symptoms.  Pt advised per Clearance Coots, MD to try Monistat 7 OTC.

## 2022-08-10 ENCOUNTER — Other Ambulatory Visit: Payer: Self-pay | Admitting: Obstetrics

## 2022-08-10 ENCOUNTER — Ambulatory Visit (INDEPENDENT_AMBULATORY_CARE_PROVIDER_SITE_OTHER): Payer: Medicaid Other | Admitting: Obstetrics

## 2022-08-10 ENCOUNTER — Encounter: Payer: Self-pay | Admitting: Obstetrics

## 2022-08-10 VITALS — BP 110/65 | HR 67 | Wt 180.0 lb

## 2022-08-10 DIAGNOSIS — Z3A37 37 weeks gestation of pregnancy: Secondary | ICD-10-CM

## 2022-08-10 DIAGNOSIS — Z3483 Encounter for supervision of other normal pregnancy, third trimester: Secondary | ICD-10-CM

## 2022-08-10 DIAGNOSIS — B379 Candidiasis, unspecified: Secondary | ICD-10-CM

## 2022-08-10 LAB — CULTURE, OB URINE

## 2022-08-10 MED ORDER — TERCONAZOLE 0.8 % VA CREA: 1.0000 | TOPICAL_CREAM | Freq: Every day | VAGINAL | 0 refills | Status: DC

## 2022-08-10 NOTE — Progress Notes (Signed)
Patient presents for ROB. Patient has no concerns today. 

## 2022-08-10 NOTE — Progress Notes (Signed)
Subjective:  Tammie Martinez is a 31 y.o. [phone] at [redacted]w[redacted]d being seen today for ongoing prenatal care.  She is currently monitored for the following issues for this low-risk pregnancy and has Vitamin D deficiency; Lesion of left nipple; Supervision of normal pregnancy; Alpha thalassemia silent carrier; and Polyhydramnios affecting pregnancy on their problem list.  Patient reports occasional contractions.  Contractions: Irregular. Vag. Bleeding: None.  Movement: Present. Denies leaking of fluid.   The following portions of the patient's history were reviewed and updated as appropriate: allergies, current medications, past family history, past medical history, past social history, past surgical history and problem list. Problem list updated.  Objective:   Vitals:   08/10/22 1407  BP: 110/65  Pulse: 67  Weight: 180 lb (81.6 kg)    Fetal Status: Fetal Heart Rate (bpm): 140   Movement: Present     General:  Alert, oriented and cooperative. Patient is in no acute distress.  Skin: Skin is warm and dry. No rash noted.   Cardiovascular: Normal heart rate noted  Respiratory: Normal respiratory effort, no problems with respiration noted  Abdomen: Soft, gravid, appropriate for gestational age. Pain/Pressure: Present     Pelvic:  Cervical exam deferred        Extremities: Normal range of motion.  Edema: None  Mental Status: Normal mood and affect. Normal behavior. Normal judgment and thought content.   Urinalysis:      Assessment and Plan:  Pregnancy: [id number] at 109w3d  1. Encounter for supervision of other normal pregnancy in third trimester   Preterm labor symptoms and general obstetric precautions including but not limited to vaginal bleeding, contractions, leaking of fluid and fetal movement were reviewed in detail with the patient. Please refer to After Visit Summary for other counseling recommendations.   Return in about 1 week (around 08/17/2022) for ROB.   Brock Bad,  MD 08/10/22

## 2022-08-10 NOTE — Progress Notes (Signed)
TC. Pt aware and picked up RX 08/09/22.

## 2022-08-16 ENCOUNTER — Ambulatory Visit: Payer: Medicaid Other

## 2022-08-16 ENCOUNTER — Ambulatory Visit: Payer: Medicaid Other | Attending: Maternal & Fetal Medicine

## 2022-08-16 VITALS — BP 111/56 | HR 76

## 2022-08-16 DIAGNOSIS — O409XX0 Polyhydramnios, unspecified trimester, not applicable or unspecified: Secondary | ICD-10-CM | POA: Insufficient documentation

## 2022-08-16 DIAGNOSIS — O99891 Other specified diseases and conditions complicating pregnancy: Secondary | ICD-10-CM | POA: Diagnosis not present

## 2022-08-16 DIAGNOSIS — O403XX0 Polyhydramnios, third trimester, not applicable or unspecified: Secondary | ICD-10-CM | POA: Diagnosis not present

## 2022-08-16 DIAGNOSIS — O359XX0 Maternal care for (suspected) fetal abnormality and damage, unspecified, not applicable or unspecified: Secondary | ICD-10-CM | POA: Diagnosis not present

## 2022-08-16 DIAGNOSIS — Z3A38 38 weeks gestation of pregnancy: Secondary | ICD-10-CM | POA: Diagnosis not present

## 2022-08-16 DIAGNOSIS — Z148 Genetic carrier of other disease: Secondary | ICD-10-CM | POA: Diagnosis not present

## 2022-08-16 DIAGNOSIS — Z3483 Encounter for supervision of other normal pregnancy, third trimester: Secondary | ICD-10-CM | POA: Insufficient documentation

## 2022-08-18 ENCOUNTER — Ambulatory Visit (INDEPENDENT_AMBULATORY_CARE_PROVIDER_SITE_OTHER): Payer: Medicaid Other | Admitting: Obstetrics and Gynecology

## 2022-08-18 VITALS — BP 105/65 | HR 76 | Wt 183.0 lb

## 2022-08-18 DIAGNOSIS — O409XX0 Polyhydramnios, unspecified trimester, not applicable or unspecified: Secondary | ICD-10-CM

## 2022-08-18 DIAGNOSIS — Z3483 Encounter for supervision of other normal pregnancy, third trimester: Secondary | ICD-10-CM

## 2022-08-18 DIAGNOSIS — Z3A38 38 weeks gestation of pregnancy: Secondary | ICD-10-CM

## 2022-08-18 NOTE — Progress Notes (Signed)
PRENATAL VISIT NOTE  Subjective:  Tammie Martinez is a 31 y.o. [phone] at [redacted]w[redacted]d being seen today for ongoing prenatal care.  She is currently monitored for the following issues for this high-risk pregnancy and has Vitamin D deficiency; Lesion of left nipple; Supervision of normal pregnancy; Alpha thalassemia silent carrier; and Polyhydramnios affecting pregnancy on their problem list.  Patient reports no complaints.  Contractions: Irregular. Vag. Bleeding: None.  Movement: Present. Denies leaking of fluid.   The following portions of the patient's history were reviewed and updated as appropriate: allergies, current medications, past family history, past medical history, past social history, past surgical history and problem list.   Objective:   Vitals:   08/18/22 0916  BP: 105/65  Pulse: 76  Weight: 183 lb (83 kg)    Fetal Status: Fetal Heart Rate (bpm): 140 Fundal Height: 41 cm Movement: Present     General:  Alert, oriented and cooperative. Patient is in no acute distress.  Skin: Skin is warm and dry. No rash noted.   Cardiovascular: Normal heart rate noted  Respiratory: Normal respiratory effort, no problems with respiration noted  Abdomen: Soft, gravid, appropriate for gestational age.  Pain/Pressure: Present     Pelvic: Cervical exam performed in the presence of a chaperone Dilation: 2 Effacement (%): 50 Station: -3  Extremities: Normal range of motion.     Mental Status: Normal mood and affect. Normal behavior. Normal judgment and thought content.  [place] MFM FETAL BPP WO NON STRESS  Result Date: 08/16/2022 ----------------------------------------------------------------------  OBSTETRICS REPORT                       (Signed Final 08/16/2022 09:39 am) ---------------------------------------------------------------------- Patient Info  ID #:       [id number]                          D.O.B.:  [birth year] (31 yrs)  Name:       Tammie Martinez                    Visit Date: 08/16/2022 09:27 am  ---------------------------------------------------------------------- Performed By  Attending:        Braxton Feathers DO       Ref. Address:     [facility]                                                             [facility]                                                             [facility]care  Performed By:     Cyndie Mull          [facility]  Referred By:      Warden Fillers MD ---------------------------------------------------------------------- Orders  #  Description                           Code        Ordered By  1  [place] MFM FETAL BPP WO NON               76819.01    [facility]     STRESS  2  [place] MFM OB FOLLOW UP                   76816.01    [facility] ----------------------------------------------------------------------  #  Order #                     Accession #                Episode #  1  [id number]                   [id number]                 [id number]  2  [id number]                   [id number]                 [id number] ---------------------------------------------------------------------- Indications  Polyhydramnios, third trimester, antepartum    O40.3XX0  condition or complication, unspecified fetus  Fetal abnormality - (englarged fetal           O35.9XX0  gallbalder- resolved)  NIPS LR/ AFP neg  [redacted] weeks gestation of pregnancy                Z3A.38  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 Cord Insertion:      Previously Visualized  Amniotic Fluid  AFI FV:      Polyhydramnios  AFI  Sum(cm)     %Tile       Largest Pocket(cm)  30.7            > 97        9.1  RUQ(cm)       RLQ(cm)  LUQ(cm)        LLQ(cm)  6.5           8.3           9.1            6.8 ---------------------------------------------------------------------- Biophysical Evaluation  Amniotic F.V:   Polyhydramnios             F. Tone:        Observed  F. Movement:    Observed                   Score:          8/8  F. Breathing:   Observed ---------------------------------------------------------------------- Biometry  BPD:      95.7  mm     G. Age:  39w 1d         89  %    CI:        75.67   %    70 - 86                                                          FL/HC:      20.3   %    20.9 - 22.7  HC:      348.8  mm     G. Age:  40w 4d         82  %    HC/AC:      0.93        0.92 - 1.05  AC:      373.3  mm     G. Age:  41w 2d       > 99  %    FL/BPD:     73.9   %    71 - 87  FL:       70.7  mm     G. Age:  36w 2d         10  %    FL/AC:      18.9   %    20 - 24  Est. FW:    3935  gm    8 lb 11 oz      94  % ---------------------------------------------------------------------- OB History  Gravidity:    6         Term:   2         SAB:   3  Living:       2 ---------------------------------------------------------------------- Gestational Age  LMP:           39w 2d        Date:  11/14/21                  EDD:   08/21/22  U/S Today:     39w 2d                                        EDD:   08/21/22  Best:          38w 2d     Det. By:  U/S C R L  (01/21/22)    EDD:   08/28/22 ---------------------------------------------------------------------- Anatomy  Cranium:  Appears normal         LVOT:                   Previously seen  Cavum:                 Previously seen        Aortic Arch:            Previously seen  Ventricles:            Previously seen        Ductal Arch:            Not well visualized  Choroid Plexus:        Previously seen        Diaphragm:              Appears normal  Cerebellum:            Previously seen         Stomach:                Appears normal, left                                                                        sided  Posterior Fossa:       Previously seen        Abdomen:                Appears normal  Nuchal Fold:           Not applicable ([id number]    Abdominal Wall:         Previously seen                         wks GA)  Face:                  Orbits and profile     Cord Vessels:           Previously seen                         previously seen  Lips:                  Previously seen        Kidneys:                Appear normal  Palate:                Not well visualized    Bladder:                Appears normal  Thoracic:              Appears normal         Spine:                  Previously seen  Heart:                 Previously seen        Upper Extremities:      Previously seen  RVOT:  Previously seen        Lower Extremities:      Previously seen  Other:  Fetus appears to be a female. VC, 3VV and 3VTV prev visualuzed.          Nasal bone, lenses, maxilla, mandible and falx previously visualized ---------------------------------------------------------------------- Comments  The patient is here for a BPP and growth [place] for poly and  LGA. She is at 38w 2d with EDD of 08/28/2022 dated by U/S  C R L  (01/21/22).  She reports good fetal movement. She  passed her GTT.  Sonographic findings  Single intrauterine pregnancy.  Observed fetal cardiac activity.  Cephalic presentation.  Interval fetal anatomy appears normal.  Fetal biometry shows the estimated fetal weight at the 94  percentile.  Amniotic fluid volume: Polyhydramnios. AFI: 30.7 cm.  MVP:  9.1 cm.  Placenta is posterior.  BPP is 8/8.  Recommendations  1. BBPs weekly until delivery  2. Delivery around 39 weeks  3. Fetal movement precautions given ----------------------------------------------------------------------                   Valeda Malm, DO Electronically Signed Final Report   08/16/2022 09:39 am  ----------------------------------------------------------------------  Assessment and Plan:  Pregnancy: [id number] at [redacted]w[redacted]d 1. Encounter for supervision of other normal pregnancy in third trimester Patient is doing well without complaints  2. Polyhydramnios affecting pregnancy Plan for IOL at 39 weeks on 08/21/22 Answered questions regarding IOL  Term labor symptoms and general obstetric precautions including but not limited to vaginal bleeding, contractions, leaking of fluid and fetal movement were reviewed in detail with the patient. Please refer to After Visit Summary for other counseling recommendations.   No follow-ups on file.  Future Appointments  Date Time Provider [place]  08/21/2022  7:15 AM [facility] SCHED ROOM [facility]IN[facility] None  08/23/2022  9:15 AM [facility] NURSE [facility]  08/23/2022  9:30 AM [facility] US2 [facility]    Mora Bellman, MD

## 2022-08-18 NOTE — Progress Notes (Signed)
Pt states she has increase in ctx/cramping.

## 2022-08-21 ENCOUNTER — Inpatient Hospital Stay (HOSPITAL_COMMUNITY): Payer: Medicaid Other | Attending: Obstetrics and Gynecology

## 2022-08-21 ENCOUNTER — Encounter (HOSPITAL_COMMUNITY): Payer: Self-pay | Admitting: Obstetrics and Gynecology

## 2022-08-21 ENCOUNTER — Inpatient Hospital Stay (HOSPITAL_COMMUNITY)
Admission: AD | Admit: 2022-08-21 | Discharge: 2022-08-23 | DRG: 807 | Disposition: A | Discharge status: Home / Self Care | Payer: Medicaid Other | Attending: Obstetrics and Gynecology | Admitting: Obstetrics and Gynecology

## 2022-08-21 ENCOUNTER — Inpatient Hospital Stay (HOSPITAL_COMMUNITY): Payer: Medicaid Other | Admitting: Anesthesiology

## 2022-08-21 ENCOUNTER — Inpatient Hospital Stay (HOSPITAL_COMMUNITY)
Admission: RE | Admit: 2022-08-21 | Payer: Medicaid Other | Source: Home / Self Care | Admitting: Obstetrics and Gynecology

## 2022-08-21 ENCOUNTER — Other Ambulatory Visit: Payer: Self-pay

## 2022-08-21 DIAGNOSIS — Z3483 Encounter for supervision of other normal pregnancy, third trimester: Principal | ICD-10-CM

## 2022-08-21 DIAGNOSIS — Z3490 Encounter for supervision of normal pregnancy, unspecified, unspecified trimester: Secondary | ICD-10-CM | POA: Diagnosis present

## 2022-08-21 DIAGNOSIS — O9902 Anemia complicating childbirth: Secondary | ICD-10-CM | POA: Diagnosis not present

## 2022-08-21 DIAGNOSIS — O403XX0 Polyhydramnios, third trimester, not applicable or unspecified: Secondary | ICD-10-CM | POA: Diagnosis not present

## 2022-08-21 DIAGNOSIS — Z880 Allergy status to penicillin: Secondary | ICD-10-CM | POA: Diagnosis not present

## 2022-08-21 DIAGNOSIS — O409XX0 Polyhydramnios, unspecified trimester, not applicable or unspecified: Secondary | ICD-10-CM

## 2022-08-21 DIAGNOSIS — D649 Anemia, unspecified: Secondary | ICD-10-CM | POA: Diagnosis not present

## 2022-08-21 DIAGNOSIS — Z148 Genetic carrier of other disease: Secondary | ICD-10-CM

## 2022-08-21 DIAGNOSIS — Z3A39 39 weeks gestation of pregnancy: Secondary | ICD-10-CM

## 2022-08-21 DIAGNOSIS — O26893 Other specified pregnancy related conditions, third trimester: Secondary | ICD-10-CM | POA: Diagnosis not present

## 2022-08-21 LAB — CBC
HCT: 31.8 % — ABNORMAL LOW (ref 36.0–46.0)
Hemoglobin: 10.7 g/dL — ABNORMAL LOW (ref 12.0–15.0)
MCH: 29.2 pg (ref 26.0–34.0)
MCHC: 33.6 g/dL (ref 30.0–36.0)
MCV: 86.9 fL (ref 80.0–100.0)
Platelets: 247 10*3/uL (ref 150–400)
RBC: 3.66 MIL/uL — ABNORMAL LOW (ref 3.87–5.11)
RDW: 13.5 % (ref 11.5–15.5)
WBC: 11.3 10*3/uL — ABNORMAL HIGH (ref 4.0–10.5)
nRBC: 0 % (ref 0.0–0.2)

## 2022-08-21 LAB — TYPE AND SCREEN
ABO/RH(D): O POS
Antibody Screen: NEGATIVE

## 2022-08-21 MED ORDER — SOD CITRATE-CITRIC ACID 500-334 MG/5ML PO SOLN: 30.0000 mL | ORAL | Status: DC | PRN

## 2022-08-21 MED ORDER — OXYTOCIN BOLUS FROM INFUSION
333.0000 mL | Freq: Once | INTRAVENOUS | Status: AC
  Administered 2022-08-22: 333 mL via INTRAVENOUS

## 2022-08-21 MED ORDER — LACTATED RINGERS IV SOLN: 500.0000 mL | INTRAVENOUS | Status: DC | PRN

## 2022-08-21 MED ORDER — ONDANSETRON HCL 4 MG/2ML IJ SOLN: 4.0000 mg | Freq: Four times a day (QID) | INTRAMUSCULAR | Status: DC | PRN

## 2022-08-21 MED ORDER — PHENYLEPHRINE 80 MCG/ML (10ML) SYRINGE FOR IV PUSH (FOR BLOOD PRESSURE SUPPORT): 80.0000 ug | PREFILLED_SYRINGE | INTRAVENOUS | Status: DC | PRN

## 2022-08-21 MED ORDER — LACTATED RINGERS IV SOLN: 500.0000 mL | Freq: Once | INTRAVENOUS | Status: AC

## 2022-08-21 MED ORDER — FENTANYL-BUPIVACAINE-NACL 0.5-0.125-0.9 MG/250ML-% EP SOLN
12.0000 mL/h | EPIDURAL | Status: DC | PRN
  Administered 2022-08-21: 12 mL/h via EPIDURAL
  Filled 2022-08-21: qty 250

## 2022-08-21 MED ORDER — BUPIVACAINE HCL (PF) 0.25 % IJ SOLN
INTRAMUSCULAR | Status: DC | PRN
  Administered 2022-08-21 (×2): 3 mL via EPIDURAL

## 2022-08-21 MED ORDER — EPHEDRINE 5 MG/ML INJ: 10.0000 mg | INTRAVENOUS | Status: DC | PRN

## 2022-08-21 MED ORDER — OXYCODONE-ACETAMINOPHEN 5-325 MG PO TABS: 2.0000 | ORAL_TABLET | ORAL | Status: DC | PRN

## 2022-08-21 MED ORDER — ACETAMINOPHEN 325 MG PO TABS: 650.0000 mg | ORAL_TABLET | ORAL | Status: DC | PRN

## 2022-08-21 MED ORDER — OXYTOCIN-SODIUM CHLORIDE 30-0.9 UT/500ML-% IV SOLN
2.5000 [IU]/h | INTRAVENOUS | Status: DC
  Filled 2022-08-21: qty 500

## 2022-08-21 MED ORDER — DIPHENHYDRAMINE HCL 50 MG/ML IJ SOLN: 12.5000 mg | INTRAMUSCULAR | Status: DC | PRN

## 2022-08-21 MED ORDER — OXYCODONE-ACETAMINOPHEN 5-325 MG PO TABS: 1.0000 | ORAL_TABLET | ORAL | Status: DC | PRN

## 2022-08-21 MED ORDER — LIDOCAINE HCL (PF) 1 % IJ SOLN: 30.0000 mL | INTRAMUSCULAR | Status: DC | PRN

## 2022-08-21 MED ORDER — LACTATED RINGERS IV SOLN: INTRAVENOUS | Status: DC

## 2022-08-21 MED ORDER — LIDOCAINE-EPINEPHRINE (PF) 2 %-1:200000 IJ SOLN
INTRAMUSCULAR | Status: DC | PRN
  Administered 2022-08-21: 3 mL via EPIDURAL

## 2022-08-21 NOTE — H&P (Addendum)
OBSTETRIC ADMISSION HISTORY AND PHYSICAL  Tammie Martinez is a 31 y.o. female [phone] with IUP at 51w0dby [place] SOL. She was scheduled for IOL. She reports +FMs, No LOF, small spotting of red-brown fluid, no blurry vision, headaches or peripheral edema, and RUQ pain.  She plans on breast feeding. She declined birth control. She [facility] [place]  Estimated Date of Delivery: 08/28/22  Sono:    _0 , CWD, normal anatomy, cephalic presentation,  31324M 94% EFW   Nursing Staff Provider  Office Location  [facility] Dating    PMackey[Valu.Nieves] Traditional _1  Centering _2  Mom-Baby Dyad    Language  English Anatomy [place] Incomplete, nl on 08/16/22 [place] Flu Vaccine  Declined 06/28/22 Genetic/Carrier Screen  NIPS:  Low risk panorama  AFP:   negative Horizon: silent carrier alpha thal  TDaP Vaccine   Declined 06/28/22 Hgb A1C or  GTT Early  Third trimester : WNL  COVID Vaccine Not vaccinated   LAB RESULTS   Rhogam   Blood Type O/Positive/-- (07/03 1052)   Baby Feeding Plan Breast Antibody Negative (07/03 1052)  Contraception Declined  Rubella <0.90 (07/03 1052)  Circumcision Yes if a boy RPR Non Reactive (09/13 1111)   Pediatrician  [facility] HBsAg Negative (07/03 1052)   Support Person FOB HCVAb   negative  Prenatal Classes  HIV Non Reactive (09/13 1111)     BTL Consent  GBS   (For PCN allergy, check sensitivities)   VBAC Consent  Pap        DME Rx [Valu.Nieves] BP cuff [Valu.Nieves] Weight Scale Waterbirth  _3  Class _4  Consent _5  CNM visit  PHQ9 & GAD7 [ X ] new OB [ x ] 28 weeks  [x  ] 36 weeks Induction  _6  Orders Entered _7 Foley Y/N    Prenatal History/Complications:  -Polyhydramnios with AFI 30.7 cm -Enlarged fetal gallbladder, resolved -Alpha thal silent carrier  Past Medical History: Past Medical History:  Diagnosis Date   Medical history non-contributory     Past Surgical History: Past Surgical History:  Procedure  Laterality Date   NO PAST SURGERIES      Obstetrical History: OB History     Gravida  6   Para  2   Term  2   Preterm      AB  3   Living  2      SAB  3   IAB      Ectopic      Multiple  0   Live Births  2           Social History Social History   Socioeconomic History   Marital status: Single    Spouse name: Not on file   Number of children: Not on file   Years of education: Not on file   Highest education level: Not on file  Occupational History   Not on file  Tobacco Use   Smoking status: Never   Smokeless tobacco: Never  Vaping Use   Vaping Use: Never used  Substance and Sexual Activity   Alcohol use: Not Currently    Alcohol/week: 7.0 standard drinks of alcohol    Types: 7 Glasses of wine per week    Comment: drinks on weekends, not since confirmed pregnancy   Drug use: Not Currently    Frequency: 7.0 times per week  Types: Marijuana    Comment: Quit smoking   Sexual activity: Yes    Partners: Male    Birth control/protection: None  Other Topics Concern   Not on file  Social History Narrative   Not on file   Social Determinants of Health   Financial Resource Strain: Not on file  Food Insecurity: No Food Insecurity (08/21/2022)   Hunger Vital Sign    Worried About Running Out of Food in the Last Year: Never true    Ran Out of Food in the Last Year: Never true  Transportation Needs: No Transportation Needs (08/21/2022)   PRAPARE - Hydrologist (Medical): No    Lack of Transportation (Non-Medical): No  Physical Activity: Not on file  Stress: Not on file  Social Connections: Not on file    Family History: Family History  Problem Relation Age of Onset   Healthy Mother    Heart disease Father    Yves Dill Parkinson White syndrome Brother    Hypertension Maternal Grandfather    COPD Maternal Grandfather    Asthma Neg Hx    Diabetes Neg Hx    Stroke Neg Hx     Allergies: Allergies  Allergen  Reactions   Penicillins Hives    Has patient had a PCN reaction causing immediate rash, facial/tongue/throat swelling, SOB or lightheadedness with hypotension: Yes Has patient had a PCN reaction causing severe rash involving mucus membranes or skin necrosis: Yes Has patient had a PCN reaction that required hospitalization: No Has patient had a PCN reaction occurring within the last 10 years: No If all of the above answers are "NO", then may proceed with Cephalosporin use.    Sulfa Antibiotics Hives    Medications Prior to Admission  Medication Sig Dispense Refill Last Dose   ferrous sulfate (FERROUSUL) 325 (65 FE) MG tablet Take 1 tablet every other day 60 tablet 1 Past Week   Prenat-Fe Poly-Methfol-FA-DHA (VITAFOL ULTRA) 29-0.6-0.4-200 MG CAPS Take 1 capsule by mouth daily. 30 capsule 11 Past Week   Blood Pressure Monitoring (BLOOD PRESSURE KIT) DEVI 1 kit by Does not apply route once a week. (Patient not taking: Reported on 08/10/2022) 1 each 0 Unknown   clotrimazole (LOTRIMIN) 1 % cream Apply 1 Application topically 2 (two) times daily. 30 g 0 More than a month   fluconazole (DIFLUCAN) 150 MG tablet Take one tablet now and one tablet in 72 hours (3 days). (Patient not taking: Reported on 08/16/2022) 2 tablet 0 Unknown   metroNIDAZOLE (FLAGYL) 500 MG tablet Take 1 tablet (500 mg total) by mouth 2 (two) times daily. (Patient not taking: Reported on 08/16/2022) 14 tablet 0 Unknown   Misc. Devices (GOJJI WEIGHT SCALE) MISC 1 Device by Does not apply route every 30 (thirty) days. (Patient not taking: Reported on 08/10/2022) 1 each 0 Unknown   terconazole (TERAZOL 3) 0.8 % vaginal cream Place 1 applicator vaginally at bedtime. (Patient not taking: Reported on 08/16/2022) 20 g 0 Unknown     Review of Systems   All systems reviewed and negative except as stated in HPI  Blood pressure 110/69, pulse (!) 113, temperature 97.9 F (36.6 C), temperature source Oral, resp. rate 18, height 5' 4.5"  (1.638 m), weight 82.8 kg, last menstrual period 11/14/2021, SpO2 98 %, unknown if currently breastfeeding. General appearance: alert, cooperative, and no distress Lungs: no increased WOB on RA Heart: tachycardic Abdomen: gravid abdomen Extremities: no sign of DVT, ambulating on admission Presentation: cephalic Fetal monitoringBaseline:  135 bpm, Variability: Good {> 6 bpm), Accelerations: Reactive, and Decelerations: Absent Uterine activityFrequency: Every 7-8 minutes Dilation: 3 Effacement (%): 80 Station: -3 Exam by:: J.Bellamy,RN   Prenatal labs: ABO, Rh: --/--/PENDING (12/03 1601) Antibody: PENDING (12/03 1601) Rubella: <0.90 (07/03 1052) RPR: Non Reactive (09/13 1111)  HBsAg: Negative (07/03 1052)  HIV: Non Reactive (09/13 1111)  GBS: Negative/-- (11/16 1045)  1 hr Glucola: nl Genetic screening:  silent carrier alpha thal Anatomy US: normal  Prenatal Transfer Tool  Maternal Diabetes: No Genetic Screening: Abnormal:  Results: Other:silent alpha thal carrier Maternal Ultrasounds/Referrals: polyhydramnios, resolved enlarged fetal gallbladder Fetal Ultrasounds or other Referrals:  Referred to Materal Fetal Medicine for above Maternal Substance Abuse:  No Significant Maternal Medications:  diflucan 10.24.23 Significant Maternal Lab Results: Group B Strep negative and Other: rubella non-immune  Results for orders placed or performed during the hospital encounter of 08/21/22 (from the past 24 hour(s))  Type and screen   Collection Time: 08/21/22  4:01 PM  Result Value Ref Range   ABO/RH(D) PENDING    Antibody Screen PENDING    Sample Expiration      08/24/2022,2359 Performed at [facility], [place] [address] [place], [place] [address]   CBC   Collection Time: 08/21/22  4:15 PM  Result Value Ref Range   WBC 11.3 (H) 4.0 - 10.5 K/uL   RBC 3.66 (L) 3.87 - 5.11 MIL/uL   Hemoglobin 10.7 (L) 12.0 - 15.0 g/dL   HCT 31.8 (L) 36.0 - 46.0 %   MCV 86.9 80.0 - 100.0 fL    MCH 29.2 26.0 - 34.0 pg   MCHC 33.6 30.0 - 36.0 g/dL   RDW 13.5 11.5 - 15.5 %   Platelets 247 150 - 400 K/uL   nRBC 0.0 0.0 - 0.2 %    Patient Active Problem List   Diagnosis Date Noted   Encounter for induction of labor 08/21/2022   Polyhydramnios affecting pregnancy 07/12/2022   Alpha thalassemia silent carrier 04/20/2022   Supervision of normal pregnancy 01/21/2022   Lesion of left nipple 09/30/2021   Vitamin D deficiency 09/25/2017    Assessment/Plan:  Tammie A Schauer is a 31 y.o. [id number] at 67w0dhere for SOL.  #Labor:latent, progressing. Will AROM when appropriate. Start pitocin 2x2. #Pain: none for now, undecided going forward, will continue to revisit conversation as labor progresses #FWB: cat 1 #ID:  GBS neg, rubella non-immune - will give MMR pp #MOF: breast #MOC: declined #Circ:  yes  BEthelene Hal M[place]for [place]12/11/2021, 5:34 PM  GME ATTESTATION:  I saw and evaluated the patient. I agree with the findings and the plan of care as documented in the resident's note. I have made changes to documentation as necessary.  SGerlene Fee DO OB Fellow, FB[place]for [place]12/11/2021, 7:34 PM

## 2022-08-21 NOTE — Anesthesia Procedure Notes (Signed)
Epidural Patient location during procedure: OB Start time: 08/21/2022 11:27 PM End time: 08/21/2022 11:48 PM  Staffing Anesthesiologist: Val Eagle, MD Performed: anesthesiologist   Preanesthetic Checklist Completed: patient identified, IV checked, risks and benefits discussed, monitors and equipment checked, pre-op evaluation and timeout performed  Epidural Patient position: sitting Prep: DuraPrep Patient monitoring: heart rate, continuous pulse ox and blood pressure Approach: midline Location: L4-L5 Injection technique: LOR saline  Needle:  Needle type: Tuohy  Needle gauge: 17 G Needle length: 9 cm Needle insertion depth: 6 cm Catheter type: closed end flexible Catheter size: 19 Gauge Catheter at skin depth: 12 cm Test dose: negative and 2% lidocaine with Epi 1:200 K  Assessment Events: blood not aspirated, injection not painful, no injection resistance, no paresthesia and negative IV test

## 2022-08-21 NOTE — MAU Note (Signed)
Tammie Martinez is a 31 y.o. at [redacted]w[redacted]d here in MAU reporting: ctxs that are 3-5 minutes apart that began 1000 this morning.  Reports noted pinkish brown discharge on sanitary napkin.  Denies LOF.  Endorses +FM. LMP: NA Onset of complaint: today  Pain score: 9 Vitals:   08/21/22 1309  BP: 119/65  Pulse: 97  Resp: 19  Temp: 98.2 F (36.8 C)  SpO2: 99%     FHT: 164 bpm Lab orders placed from triage:  NA

## 2022-08-21 NOTE — Anesthesia Preprocedure Evaluation (Signed)
Anesthesia Evaluation  Patient identified by MRN, date of birth, ID band Patient awake    Reviewed: Allergy & Precautions, Patient's Chart, lab work & pertinent test results  History of Anesthesia Complications Negative for: history of anesthetic complications  Airway Mallampati: II       Dental   Pulmonary neg pulmonary ROS   breath sounds clear to auscultation       Cardiovascular negative cardio ROS  Rhythm:Regular     Neuro/Psych negative neurological ROS  negative psych ROS   GI/Hepatic negative GI ROS, Neg liver ROS,,,  Endo/Other  negative endocrine ROS    Renal/GU negative Renal ROS     Musculoskeletal   Abdominal   Peds  Hematology  (+) Blood dyscrasia, anemia Lab Results      Component                Value               Date                      WBC                      11.3 (H)            08/21/2022                HGB                      10.7 (L)            08/21/2022                HCT                      31.8 (L)            08/21/2022                MCV                      86.9                08/21/2022                PLT                      247                 08/21/2022              Anesthesia Other Findings   Reproductive/Obstetrics (+) Pregnancy                             Anesthesia Physical Anesthesia Plan  ASA: 2  Anesthesia Plan: Epidural   Post-op Pain Management:    Induction:   PONV Risk Score and Plan: 2 and Treatment may vary due to age or medical condition  Airway Management Planned: Natural Airway  Additional Equipment: None  Intra-op Plan:   Post-operative Plan:   Informed Consent: I have reviewed the patients History and Physical, chart, labs and discussed the procedure including the risks, benefits and alternatives for the proposed anesthesia with the patient or authorized representative who has indicated his/her understanding and  acceptance.       Plan Discussed with:   Anesthesia Plan Comments:  Anesthesia Quick Evaluation  

## 2022-08-22 ENCOUNTER — Encounter (HOSPITAL_COMMUNITY): Payer: Self-pay | Admitting: Obstetrics and Gynecology

## 2022-08-22 DIAGNOSIS — O403XX0 Polyhydramnios, third trimester, not applicable or unspecified: Secondary | ICD-10-CM | POA: Diagnosis not present

## 2022-08-22 DIAGNOSIS — Z3A39 39 weeks gestation of pregnancy: Secondary | ICD-10-CM | POA: Diagnosis not present

## 2022-08-22 LAB — RPR: RPR Ser Ql: NONREACTIVE

## 2022-08-22 MED ORDER — BENZOCAINE-MENTHOL 20-0.5 % EX AERO
1.0000 | INHALATION_SPRAY | CUTANEOUS | Status: DC | PRN
  Administered 2022-08-22: 1 via TOPICAL
  Filled 2022-08-22: qty 56

## 2022-08-22 MED ORDER — OXYCODONE HCL 5 MG PO TABS: 5.0000 mg | ORAL_TABLET | ORAL | Status: DC | PRN

## 2022-08-22 MED ORDER — ZOLPIDEM TARTRATE 5 MG PO TABS: 5.0000 mg | ORAL_TABLET | Freq: Every evening | ORAL | Status: DC | PRN

## 2022-08-22 MED ORDER — WITCH HAZEL-GLYCERIN EX PADS: 1.0000 | MEDICATED_PAD | CUTANEOUS | Status: DC | PRN

## 2022-08-22 MED ORDER — MEASLES, MUMPS & RUBELLA VAC IJ SOLR: 0.5000 mL | Freq: Once | INTRAMUSCULAR | Status: DC

## 2022-08-22 MED ORDER — ONDANSETRON HCL 4 MG PO TABS: 4.0000 mg | ORAL_TABLET | ORAL | Status: DC | PRN

## 2022-08-22 MED ORDER — DIPHENHYDRAMINE HCL 25 MG PO CAPS: 25.0000 mg | ORAL_CAPSULE | Freq: Four times a day (QID) | ORAL | Status: DC | PRN

## 2022-08-22 MED ORDER — OXYCODONE HCL 5 MG PO TABS: 10.0000 mg | ORAL_TABLET | ORAL | Status: DC | PRN

## 2022-08-22 MED ORDER — IBUPROFEN 600 MG PO TABS
600.0000 mg | ORAL_TABLET | Freq: Four times a day (QID) | ORAL | Status: DC
  Administered 2022-08-22 – 2022-08-23 (×5): 600 mg via ORAL
  Filled 2022-08-22 (×5): qty 1

## 2022-08-22 MED ORDER — ACETAMINOPHEN 325 MG PO TABS: 650.0000 mg | ORAL_TABLET | ORAL | Status: DC | PRN

## 2022-08-22 MED ORDER — PRENATAL MULTIVITAMIN CH
1.0000 | ORAL_TABLET | Freq: Every day | ORAL | Status: DC
  Administered 2022-08-22 – 2022-08-23 (×2): 1 via ORAL
  Filled 2022-08-22 (×2): qty 1

## 2022-08-22 MED ORDER — SIMETHICONE 80 MG PO CHEW: 80.0000 mg | CHEWABLE_TABLET | ORAL | Status: DC | PRN

## 2022-08-22 MED ORDER — ONDANSETRON HCL 4 MG/2ML IJ SOLN: 4.0000 mg | INTRAMUSCULAR | Status: DC | PRN

## 2022-08-22 MED ORDER — SENNOSIDES-DOCUSATE SODIUM 8.6-50 MG PO TABS
2.0000 | ORAL_TABLET | Freq: Every day | ORAL | Status: DC
  Administered 2022-08-23: 2 via ORAL
  Filled 2022-08-22: qty 2

## 2022-08-22 MED ORDER — COCONUT OIL OIL: 1.0000 | TOPICAL_OIL | Status: DC | PRN

## 2022-08-22 MED ORDER — DIBUCAINE (PERIANAL) 1 % EX OINT: 1.0000 | TOPICAL_OINTMENT | CUTANEOUS | Status: DC | PRN

## 2022-08-22 NOTE — Anesthesia Postprocedure Evaluation (Signed)
Anesthesia Post Note  Patient: Tammie Martinez  Procedure(s) Performed: AN AD HOC LABOR EPIDURAL     Patient location during evaluation: Mother Baby Anesthesia Type: Epidural Level of consciousness: awake and alert Pain management: pain level controlled Vital Signs Assessment: post-procedure vital signs reviewed and stable Respiratory status: spontaneous breathing, nonlabored ventilation and respiratory function stable Cardiovascular status: stable Postop Assessment: no headache, no backache and epidural receding Anesthetic complications: no  No notable events documented.  Last Vitals:  Vitals:   08/22/22 1218 08/22/22 1620  BP: 105/66 117/67  Pulse: (!) 105 78  Resp: 18 17  Temp: 37.2 C 37 C  SpO2: 98% 98%    Last Pain:  Vitals:   08/22/22 1802  TempSrc:   PainSc: 5    Pain Goal:                   [name]

## 2022-08-22 NOTE — Progress Notes (Signed)
Labor Progress Note [name] is Tammie 31 y.o. [phone] at [redacted]w[redacted]d presented for SOL  S:  Feeling comfortable with epidural in place, ready for AROM. MGM and FOB at bedside for support.  O:  BP 121/66   Pulse 79   Temp 98.3 F (36.8 C) (Oral)   Resp 17   Ht 5' 4.5" (1.638 m)   Wt 182 lb 8 oz (82.8 kg)   LMP 11/14/2021   SpO2 100%   BMI 30.84 kg/m  EFM: baseline 140 bpm/ moderate variability/ 15x15 accels/ no decels  Toco/IUPC: not tracing well, were q2-14min, pt states they are more like q2-7min now SVE: Dilation: 7 Effacement (%): 80 Cervical Position: Posterior Station: -2 Presentation: Vertex Exam by:: Jamilia, CNM Pitocin: None  Tammie/P: 31 y.o. [id number] [redacted]w[redacted]d  1. Labor: Active, progressing well 2. FWB: Cat 1 3. Pain: Planning epidural 4. GBS neg  AROM completed with clear return of fluid, cervix thinned out and baby descended to -1 with contraction after AROM. Pt repositioned to upright, encouraged frequent position changes. Will start Pitocin titration if needed. Anticipate SVD.  Edd Arbour, CNM, MSN, IBCLC Certified Nurse Midwife, [facility]

## 2022-08-22 NOTE — Discharge Summary (Signed)
Postpartum Discharge Summary  Date of Service updated***     Patient Name: Tammie Martinez DOB: 1991/08/06 MRN: [id number]  Date of admission: 08/21/2022 Delivery date:08/22/2022  Delivering provider: Gaylan Gerold R  Date of discharge: 08/22/2022  Admitting diagnosis: Encounter for induction of labor [Z34.90] Intrauterine pregnancy: [redacted]w[redacted]d    Secondary diagnosis:  Principal Problem:   Encounter for induction of labor  Additional problems: None    Discharge diagnosis: Term Pregnancy Delivered                                              Post partum procedures: None Augmentation: AROM Complications: None  Hospital course: Onset of Labor With Vaginal Delivery      31y.o. yo [id number] admitted in Active Labor on 08/21/2022. Labor course was complicated by nothing.  Membrane Rupture Time/Date: 12:09 AM ,08/22/2022   Delivery Method:Vaginal, Spontaneous  Episiotomy: None  Lacerations:  1st degree;Perineal  Patient had a postpartum course complicated by ***.  She is ambulating, tolerating a regular diet, passing flatus, and urinating well. Patient is discharged home in stable condition on 08/22/22.  Newborn Data: Birth date:08/22/2022  Birth time:3:35 AM  Gender:Female  Living status:  Apgars:9 ,9  Weight:   Magnesium Sulfate received: No BMZ received: No Rhophylac:N/A MMR:Yes T-DaP:Given prenatally Flu: No Transfusion:No  Physical exam  Vitals:   08/22/22 0045 08/22/22 0330 08/22/22 0345 08/22/22 0400  BP:  95/75 (!) 111/52 113/61  Pulse:  (!) 113 92 75  Resp:      Temp:      TempSrc:      SpO2: 100%     Weight:      Height:       General: {Exam; general:21111117} Lochia: {Desc; appropriate/inappropriate:30686::"appropriate"} Uterine Fundus: {Desc; firm/soft:30687} Incision: {Exam; incision:21111123} DVT Evaluation: {Exam; dvt:2111122} Labs: Lab Results  Component Value Date   WBC 11.3 (H) 08/21/2022   HGB 10.7 (L) 08/21/2022   HCT 31.8 (L)  08/21/2022   MCV 86.9 08/21/2022   PLT 247 08/21/2022      Latest Ref Rng & Units 10/09/2015   11:45 PM  CMP  Glucose 65 - 99 mg/dL 87   BUN 6 - 20 mg/dL 10   Creatinine 0.44 - 1.00 mg/dL 0.75   Sodium 135 - 145 mmol/L 142   Potassium 3.5 - 5.1 mmol/L 4.0   Chloride 101 - 111 mmol/L 105   CO2 22 - 32 mmol/L 27   Calcium 8.9 - 10.3 mg/dL 9.5   Total Protein 6.5 - 8.1 g/dL 7.1   Total Bilirubin 0.3 - 1.2 mg/dL 0.8   Alkaline Phos 38 - 126 U/L 55   AST 15 - 41 U/L 19   ALT 14 - 54 U/L 16    Edinburgh Score:    03/28/2018    2:47 PM  Edinburgh Postnatal Depression Scale Screening Tool  I have been able to laugh and see the funny side of things. 0  I have looked forward with enjoyment to things. 0  I have blamed myself unnecessarily when things went wrong. 0  I have been anxious or worried for no good reason. 0  I have felt scared or panicky for no good reason. 0  Things have been getting on top of me. 0  I have been so unhappy that I have had difficulty sleeping.  0  I have felt sad or miserable. 0  I have been so unhappy that I have been crying. 0  The thought of harming myself has occurred to me. 0  Edinburgh Postnatal Depression Scale Total 0     After visit meds:  Allergies as of 08/22/2022       Reactions   Penicillins Hives   Has patient had a PCN reaction causing immediate rash, facial/tongue/throat swelling, SOB or lightheadedness with hypotension: Yes Has patient had a PCN reaction causing severe rash involving mucus membranes or skin necrosis: Yes Has patient had a PCN reaction that required hospitalization: No Has patient had a PCN reaction occurring within the last 10 years: No If all of the above answers are "NO", then may proceed with Cephalosporin use.   Sulfa Antibiotics Hives     Med Rec must be completed prior to using this [facility]***        Discharge home in stable condition Infant Feeding: {Baby feeding:23562} Infant Disposition:{CHL IP OB  HOME WITH [facility]:94709} Discharge instruction: per After Visit Summary and Postpartum booklet. Activity: Advance as tolerated. Pelvic rest for 6 weeks.  Diet: {OB GGEZ:66294765} Future Appointments: Future Appointments  Date Time Provider [place]  08/23/2022  9:15 AM [facility] NURSE [facility]  08/23/2022  9:30 AM [facility] US2 [facility] [place]   Follow up Visit: Message sent to [facility] 08/22/22 by Gaylan Gerold, CNM Please schedule this patient for a In person postpartum visit in 4 weeks with the following provider: Any provider. Additional Postpartum F/U: None   Low risk pregnancy complicated by:  None Delivery mode:  Vaginal, Spontaneous  Anticipated Birth Control:   Declined   08/22/2022 Gabriel Carina, CNM

## 2022-08-22 NOTE — Progress Notes (Addendum)
Labor Progress Note [place] A Ruan is a 31 y.o. [phone] at [redacted]w[redacted]d presented for SOL  S:  Feeling more discomfort with contractions, ready for AROM (had declined Pitocin). Also wants an epidural so discussing timing of epidural with AROM. MGM and FOB at bedside for support.  O:  BP 121/66   Pulse 79   Temp 98.3 F (36.8 C) (Oral)   Resp 17   Ht 5' 4.5" (1.638 m)   Wt 182 lb 8 oz (82.8 kg)   LMP 11/14/2021   SpO2 100%   BMI 30.84 kg/m  EFM: baseline 140 bpm/ moderate variability/ 15x15 accels/ no decels  Toco/IUPC: not tracing well, were q2-23min, pt states they are more like q2-39min now SVE: 6.5/60/-2 Pitocin: None  A/P: 31 y.o. [id number] [redacted]w[redacted]d  1. Labor: Active, progressing well 2. FWB: Cat 1 3. Pain: Planning epidural 4. GBS neg  Pt wants to get epidural prior to AROM, will return for AROM once she is comfortable. Anticipate SVD.  Edd Arbour, CNM, MSN, IBCLC Certified Nurse Midwife, [facility]

## 2022-08-22 NOTE — Lactation Note (Addendum)
This note was copied from a baby's chart. Lactation Consultation Note  Patient Name: Boy [place] Graham [id number] Date: 08/22/2022 Reason for consult: Follow-up assessment;Term;Nipple pain/trauma Age:32 hours  LC in to visit with P3 Mom of term baby.  Mom has a small, round reddened area on her left nipple.  Mom reports she has had it for 2 years at least.  Her OB had her using antifungal creams on nipple.  Mom reports its tender.  With the 20 mm nipple shield, discomfort is less.  Nipple shield still in place when [employer] in room.  Dried colostrum noted in shield.  Some dried colostrum on the swelling also.   Assisted Mom to pump for first time.  Baby started cueing when Mom pumping.   LC brought baby to Mom and she latched baby easily in football hold on right breast.  Baby opened widely with flanged lips.  Baby sucking with deep jaw extensions and swallows. Taught Mom to use alternate breast compression if baby becomes sleepy.  Encouraged more breastfeeding than pumping.  Mom to pump about 4 times per 24 hrs after baby feeds.  Mom reported she had an oversupply with other babies.  Coconut oil provided for tender nipple.  Mom does not have a DEBP for home use.   Advised her to [place] and enter her Medicaid card number.    Mom denies any further questions. Encouraged her to ask for help prn   Feeding Mother's Current Feeding Choice: Breast Milk  LATCH Score Latch: Grasps breast easily, tongue down, lips flanged, rhythmical sucking.  Audible Swallowing: Spontaneous and intermittent  Type of Nipple: Everted at rest and after stimulation  Comfort (Breast/Nipple): Filling, red/small blisters or bruises, mild/mod discomfort  Hold (Positioning): No assistance needed to correctly position infant at breast.  LATCH Score: 9   Lactation Tools Discussed/Used Tools: Pump;Flanges;Nipple Shields Nipple shield size: 20 Flange Size: 24 Breast pump type: Double-Electric  Breast Pump Pump Education: Setup, frequency, and cleaning;Milk Storage Reason for Pumping: Support milk supply/use of nipple shield Pumping frequency: Mom hasn't pumped yet, suggested she add 4 pumpings per 24 hrs.  Interventions Interventions: Breast feeding basics reviewed;Skin to skin;Breast massage;Hand express;Support pillows;DEBP Consult Status Consult Status: Follow-up Date: 08/23/22 Follow-up type: In-patient    Judee Clara 08/22/2022, 1:08 PM

## 2022-08-22 NOTE — Lactation Note (Signed)
This note was copied from a baby's chart. Lactation Consultation Note  Patient Name: Tammie Martinez Date: 08/22/2022 Reason for consult: Mother's request;Term Age:32 hours Mom requesting NS. LC took #20 NS. Told mom she may need #24 NS. Suggested to call for Lactation when she is ready to BF and Lactation would assist her on that side that needs NS. Mentioned to mom about using DEBP mo in agreement. LC opened kit. Mom eating a salad that she has been looking forward to so Lactation will come back later.  Maternal Data Does the patient have breastfeeding experience prior to this delivery?: Yes How long did the patient breastfeed?: 3 1/2 yrs 1st child and 8 months 2nd child  Feeding    LATCH Score Latch: Grasps breast easily, tongue down, lips flanged, rhythmical sucking.  Audible Swallowing: A few with stimulation  Type of Nipple: Everted at rest and after stimulation  Comfort (Breast/Nipple): Filling, red/small blisters or bruises, mild/mod discomfort (clogged duct/possibly)  Hold (Positioning): No assistance needed to correctly position infant at breast.  LATCH Score: 9   Lactation Tools Discussed/Used Tools: Pump;Nipple Shields Nipple shield size: 20 Breast pump type: Double-Electric Breast Pump Pump Education: Other (comment) (mom eating. would like to come back later.) Reason for Pumping: NS  Interventions Interventions: LC Services brochure;Pre-pump if needed;DEBP  Discharge    Consult Status Consult Status: Follow-up Date: 08/22/22 Follow-up type: In-patient    Theodoro Kalata 08/22/2022, 6:46 AM

## 2022-08-22 NOTE — Lactation Note (Signed)
This note was copied from a baby's chart. Lactation Consultation Note  Patient Name: Tammie Martinez Date: 08/22/2022 Reason for consult: L&D Initial assessment;Term Age:32 hours Experienced BF mom had baby on the breast when LC came. Adjusted flange and positioned body. Baby BF well. Mom appears to have a blocked duct to Lt. Nipple. Mom asking for NS. Mom states it is tender. Suggested mom show to MD. Mom had placenta and cord still attached to baby. Will have clamped after 1 hr old. Encouraged mom to call for assistance or questions as needed. Maternal Data Does the patient have breastfeeding experience prior to this delivery?: Yes How long did the patient breastfeed?: 3 1/2 yrs 1st child and 8 months 2nd child  Feeding    LATCH Score Latch: Grasps breast easily, tongue down, lips flanged, rhythmical sucking.  Audible Swallowing: A few with stimulation  Type of Nipple: Everted at rest and after stimulation  Comfort (Breast/Nipple): Soft / non-tender  Hold (Positioning): No assistance needed to correctly position infant at breast.  LATCH Score: 9   Lactation Tools Discussed/Used    Interventions Interventions: Assisted with latch;Breast compression  Discharge    Consult Status Consult Status: Follow-up from L&D Date: 08/22/22 Follow-up type: In-patient    Charyl Dancer 08/22/2022, 4:37 AM

## 2022-08-23 ENCOUNTER — Ambulatory Visit: Payer: Medicaid Other

## 2022-08-23 MED ORDER — ACETAMINOPHEN 325 MG PO TABS: 650.0000 mg | ORAL_TABLET | ORAL | 0 refills | Status: AC | PRN

## 2022-08-23 MED ORDER — IBUPROFEN 600 MG PO TABS: 600.0000 mg | ORAL_TABLET | Freq: Four times a day (QID) | ORAL | 0 refills | Status: AC

## 2022-08-23 NOTE — Social Work (Signed)
MOB was referred for history of depression/anxiety.  * Referral screened out by Clinical Social Worker because none of the following criteria appear to apply:  ~ History of anxiety/depression during this pregnancy, or of post-partum depression following prior delivery.  ~ Diagnosis of anxiety and/or depression within last 3 years OR * MOB's symptoms currently being treated with medication and/or therapy.  Per chart review no depression noted. None noted in OB records during this pregnancy.   Please contact the Clinical Social Worker if needs arise or by MOB request.  Wende Neighbors, LCSWA Clinical Social Worker [phone]

## 2022-08-23 NOTE — Lactation Note (Signed)
This note was copied from a baby's chart. Lactation Consultation Note  Patient Name: Tammie Martinez Date: 08/23/2022 Reason for consult: Follow-up assessment Age:32 hours   P3: Term infant at 39+1 weeks Feeding preference: Breast Weight loss: 3%  Baby was in the nursery receiving a circumcision when I arrived.  Mother had no questions/concerns related to breast feeding.  She has breast feeding experience. Last LATCH score was a 9; baby is voiding/stooling well.  Discussed the possibility of a sleepy baby after circumcision and that he may cluster feed tonight if he does not feed well during the day today.  Mother verbalized understanding.  Family has our op phone number for any concerns after discharge.  Support person present.   Maternal Data    Feeding    LATCH Score                    Lactation Tools Discussed/Used    Interventions    Discharge Discharge Education: Engorgement and breast care  Consult Status Consult Status: Complete Date: 08/23/22 Follow-up type: Call as needed    Leighann Amadon R Shandon Matson 08/23/2022, 10:02 AM

## 2022-08-23 NOTE — Progress Notes (Signed)
POSTPARTUM PROGRESS NOTE  Post Partum Day 1  Subjective:  Tammie Martinez is a 31 y.o. [phone] s/p SVD at [redacted]w[redacted]d.  No acute events overnight.  Pt denies problems with ambulating, voiding or po intake.  She denies nausea or vomiting.  Pain is moderately controlled.  She has had flatus. She has had bowel movement.  Lochia Minimal.   Objective: Blood pressure (!) 103/53, pulse (!) 59, temperature 98.2 F (36.8 C), temperature source Oral, resp. rate 18, height 5' 4.5" (1.638 m), weight 82.8 kg, last menstrual period 11/14/2021, SpO2 99 %, unknown if currently breastfeeding.  Physical Exam:  General: alert, cooperative and no distress Chest: no respiratory distress Heart:regular rate, distal pulses intact Abdomen: soft, nontender,  Uterine Fundus: firm, appropriately tender DVT Evaluation: No calf swelling or tenderness Extremities: No edema Skin: warm, dry  Recent Labs    08/21/22 1615  HGB 10.7*  HCT 31.8*    Assessment/Plan: Tammie Martinez is a 31 y.o. [phone] s/p SVD at [redacted]w[redacted]d   PPD#1 - Doing well Contraception: N/A Feeding: Breast Dispo: Plan for discharge Home in stable condition Circumcision requested   LOS: 2 days   Barron Schmid, Medical Student, CNM 08/23/2022, 7:32 AM

## 2022-08-30 ENCOUNTER — Ambulatory Visit (INDEPENDENT_AMBULATORY_CARE_PROVIDER_SITE_OTHER): Payer: Medicaid Other | Admitting: Family Medicine

## 2022-08-30 ENCOUNTER — Telehealth: Payer: Self-pay | Admitting: Emergency Medicine

## 2022-08-30 ENCOUNTER — Other Ambulatory Visit (HOSPITAL_COMMUNITY)
Admission: RE | Admit: 2022-08-30 | Discharge: 2022-08-30 | Disposition: A | Discharge status: Home / Self Care | Payer: Medicaid Other | Source: Ambulatory Visit | Attending: Family Medicine | Admitting: Family Medicine

## 2022-08-30 ENCOUNTER — Telehealth (HOSPITAL_COMMUNITY): Payer: Self-pay | Admitting: *Deleted

## 2022-08-30 ENCOUNTER — Encounter: Payer: Self-pay | Admitting: Family Medicine

## 2022-08-30 VITALS — BP 117/74 | HR 67 | Ht 64.5 in | Wt 161.8 lb

## 2022-08-30 DIAGNOSIS — N898 Other specified noninflammatory disorders of vagina: Secondary | ICD-10-CM | POA: Diagnosis not present

## 2022-08-30 DIAGNOSIS — N719 Inflammatory disease of uterus, unspecified: Secondary | ICD-10-CM

## 2022-08-30 MED ORDER — CLINDAMYCIN HCL 300 MG PO CAPS: 300.0000 mg | ORAL_CAPSULE | Freq: Three times a day (TID) | ORAL | 0 refills | Status: AC

## 2022-08-30 NOTE — Progress Notes (Signed)
Pt states she has had foul smelling green discharge for 2 day. Pt had vaginal delivery 8 days ago. Pt also c/o abdominal cramps  and nsharp vaginal pain.

## 2022-08-30 NOTE — Telephone Encounter (Signed)
Attempted Hospital Discharge Follow-Up Call.  Left voice mail requesting that patient return RN's phone call if patient has any concerns or questions regarding herself or her baby.  

## 2022-08-30 NOTE — Addendum Note (Signed)
Addended by: Marya Landry D on: 08/30/2022 04:55 PM   Modules accepted: Orders

## 2022-08-30 NOTE — Telephone Encounter (Signed)
Attempted TC to patient, LVM. 

## 2022-08-30 NOTE — Progress Notes (Signed)
   GYNECOLOGY PROBLEM  VISIT ENCOUNTER NOTE  Subjective:   Tammie Martinez is a 31 y.o. [phone] female here for a problem GYN visit.  Current complaints: green discharge. Currently 8 days pp, reports abdominal cramping and vaginal pain. No fevers.   Denies abnormal vaginal bleeding, discharge, pelvic pain, problems with intercourse or other gynecologic concerns.    Gynecologic History No LMP recorded.  Contraception: none  Health Maintenance Due  Topic Date Due   COVID-19 Vaccine (1) Never done   INFLUENZA VACCINE  Never done    The following portions of the patient's history were reviewed and updated as appropriate: allergies, current medications, past family history, past medical history, past social history, past surgical history and problem list.  Review of Systems Pertinent items are noted in HPI.   Objective:  BP 117/74   Pulse 67   Ht 5' 4.5" (1.638 m)   Wt 161 lb 12.8 oz (73.4 kg)   Breastfeeding Yes   BMI 27.34 kg/m  Gen: well appearing, NAD HEENT: no scleral icterus CV: RR Lung: Normal WOB Ext: warm well perfused  PELVIC: Normal appearing external genitalia; normal appearing vaginal mucosa and cervix.  Copious mucopurulent discharge from cervix. +abdominal tenderness on bimanual. No CMT.  Normal uterine size, no other palpable masses, no uterine or adnexal tenderness.   Assessment and Plan:  1. Vaginal discharge - clindamycin (CLEOCIN) 300 MG capsule; Take 1 capsule (300 mg total) by mouth 3 (three) times daily for 10 days.  Dispense: 30 capsule; Refill: 0  2. Postpartum care and examination  3. Endometritis Will treat what is likely mild endometritis, no fever currently Counseled on warning signs for escalation of care and when to go to hospital - clindamycin (CLEOCIN) 300 MG capsule; Take 1 capsule (300 mg total) by mouth 3 (three) times daily for 10 days.  Dispense: 30 capsule; Refill: 0    Please refer to After Visit Summary for other counseling  recommendations.   No follow-ups on file.  Federico Flake, MD, MPH, ABFM Attending Physician Faculty Practice- [facility]

## 2022-08-31 ENCOUNTER — Telehealth: Payer: Self-pay

## 2022-08-31 NOTE — Telephone Encounter (Signed)
  Written order for Postpartum recovery garment and compression socks, signed and faxed to [facility] for MOM

## 2022-09-01 LAB — CERVICOVAGINAL ANCILLARY ONLY
Bacterial Vaginitis (gardnerella): POSITIVE — AB
Candida Glabrata: NEGATIVE
Candida Vaginitis: NEGATIVE
Chlamydia: NEGATIVE
Comment: NEGATIVE
Comment: NEGATIVE
Comment: NEGATIVE
Comment: NEGATIVE
Comment: NEGATIVE
Comment: NORMAL
Neisseria Gonorrhea: NEGATIVE
Trichomonas: NEGATIVE

## 2022-09-27 ENCOUNTER — Ambulatory Visit (INDEPENDENT_AMBULATORY_CARE_PROVIDER_SITE_OTHER): Payer: Medicaid Other | Admitting: Obstetrics

## 2022-09-27 ENCOUNTER — Other Ambulatory Visit (HOSPITAL_COMMUNITY)
Admission: RE | Admit: 2022-09-27 | Discharge: 2022-09-27 | Disposition: A | Discharge status: Home / Self Care | Payer: Medicaid Other | Source: Ambulatory Visit | Attending: Obstetrics | Admitting: Obstetrics

## 2022-09-27 ENCOUNTER — Encounter: Payer: Self-pay | Admitting: Obstetrics

## 2022-09-27 DIAGNOSIS — N898 Other specified noninflammatory disorders of vagina: Secondary | ICD-10-CM | POA: Diagnosis not present

## 2022-09-27 DIAGNOSIS — R102 Pelvic and perineal pain: Secondary | ICD-10-CM

## 2022-09-27 MED ORDER — FLUCONAZOLE 150 MG PO TABS: 150.0000 mg | ORAL_TABLET | Freq: Once | ORAL | 0 refills | Status: AC

## 2022-09-27 MED ORDER — IBUPROFEN 800 MG PO TABS: 800.0000 mg | ORAL_TABLET | Freq: Three times a day (TID) | ORAL | 5 refills | Status: AC | PRN

## 2022-09-27 MED ORDER — CLINDAMYCIN HCL 300 MG PO CAPS: 300.0000 mg | ORAL_CAPSULE | Freq: Three times a day (TID) | ORAL | 0 refills | Status: DC

## 2022-09-27 NOTE — Progress Notes (Signed)
Post Partum Visit Note  Tammie Martinez is a 32 y.o. [phone] female who presents for a postpartum visit. She is 5 weeks postpartum following a normal spontaneous vaginal delivery.  I have fully reviewed the prenatal and intrapartum course. The delivery was at 39.1 gestational weeks.  Anesthesia: epidural. Postpartum course has been unremarkable. Baby is doing well. Baby is feeding by Breast. Bleeding no bleeding. Bowel function is normal. Bladder function is normal. Patient is not sexually active. Contraception method is none. Postpartum depression screening: negative.   Upstream - 09/27/22 6314       Pregnancy Intention Screening   Does the patient want to become pregnant in the next year? No    Does the patient's partner want to become pregnant in the next year? No    Would the patient like to discuss contraceptive options today? No      Contraception Wrap Up   Current Method No Contraceptive Precautions            The pregnancy intention screening data noted above was reviewed. Potential methods of contraception were discussed. The patient elected to proceed with No data recorded.   Edinburgh Postnatal Depression Scale - 09/27/22 0937       Edinburgh Postnatal Depression Scale:  In the Past 7 Days   I have been able to laugh and see the funny side of things. 0    I have looked forward with enjoyment to things. 0    I have blamed myself unnecessarily when things went wrong. 0    I have been anxious or worried for no good reason. 0    I have felt scared or panicky for no good reason. 0    Things have been getting on top of me. 0    I have been so unhappy that I have had difficulty sleeping. 0    I have felt sad or miserable. 0    I have been so unhappy that I have been crying. 0             Health Maintenance Due  Topic Date Due   INFLUENZA VACCINE  Never done    The following portions of the patient's history were reviewed and updated as appropriate: allergies,  current medications, past family history, past medical history, past social history, past surgical history, and problem list.  Review of Systems A comprehensive review of systems was negative except for: Genitourinary: positive for malodorous vaginal discharge  Objective:  There were no vitals taken for this visit.   General:  alert and no distress   Breasts:  normal  Lungs: clear to auscultation bilaterally  Heart:  regular rate and rhythm, S1, S2 normal, no murmur, click, rub or gallop  Abdomen: soft, non-tender; bowel sounds normal; no masses,  no organomegaly   Wound none  GU exam:   Greenish vaginal discharge       Assessment:    1. Postpartum care following vaginal delivery  2. Vaginal discharge Rx: - Cervicovaginal ancillary only( [place]) - clindamycin (CLEOCIN) 300 MG capsule; Take 1 capsule (300 mg total) by mouth 3 (three) times daily.  Dispense: 21 capsule; Refill: 0 - fluconazole (DIFLUCAN) 150 MG tablet; Take 1 tablet (150 mg total) by mouth once for 1 dose.  Dispense: 1 tablet; Refill: 0  3. Pelvic pain Rx: - ibuprofen (ADVIL) 800 MG tablet; Take 1 tablet (800 mg total) by mouth every 8 (eight) hours as needed.  Dispense: 30 tablet; Refill: 5  Plan:   Essential components of care per ACOG recommendations:  1.  Mood and well being: Patient with negative depression screening today. Reviewed local resources for support.  - Patient tobacco use? No.   - hx of drug use? No.    2. Infant care and feeding:  -Patient currently breastmilk feeding? Yes. Discussed returning to work and pumping. Reviewed importance of draining breast regularly to support lactation.  -Social determinants of health (SDOH) reviewed in EPIC. No concerns  3. Sexuality, contraception and birth spacing - Patient does not want a pregnancy in the next year.  Desired family size is 3 children.  - Reviewed reproductive life planning. Reviewed contraceptive methods based on pt preferences and  effectiveness.  Patient desired Abstinence today.   - Discussed birth spacing of 18 months  4. Sleep and fatigue -Encouraged family/partner/community support of 4 hrs of uninterrupted sleep to help with mood and fatigue  5. Physical Recovery  - Discussed patients delivery and complications. She describes her labor as good. - Patient had a Vaginal, no problems at delivery. Patient had a 1st degree laceration. Perineal healing reviewed. Patient expressed understanding - Patient has urinary incontinence? No. - Patient is safe to resume physical and sexual activity  6.  Health Maintenance - HM due items addressed Yes - Last pap smear  Diagnosis  Date Value Ref Range Status  03/25/2021   Final   - Negative for intraepithelial lesion or malignancy (NILM)   Pap smear not done at today's visit.  -Breast Cancer screening indicated? No.   7. Chronic Disease/Pregnancy Condition follow up: None   [facility]

## 2022-09-28 LAB — CERVICOVAGINAL ANCILLARY ONLY
Bacterial Vaginitis (gardnerella): NEGATIVE
Candida Glabrata: NEGATIVE
Candida Vaginitis: NEGATIVE
Chlamydia: NEGATIVE
Comment: NEGATIVE
Comment: NEGATIVE
Comment: NEGATIVE
Comment: NEGATIVE
Comment: NEGATIVE
Comment: NORMAL
Neisseria Gonorrhea: NEGATIVE
Trichomonas: NEGATIVE

## 2022-11-08 ENCOUNTER — Ambulatory Visit (INDEPENDENT_AMBULATORY_CARE_PROVIDER_SITE_OTHER): Payer: Medicaid Other | Admitting: Obstetrics

## 2022-11-08 ENCOUNTER — Encounter: Payer: Self-pay | Admitting: Obstetrics

## 2022-11-08 DIAGNOSIS — N898 Other specified noninflammatory disorders of vagina: Secondary | ICD-10-CM

## 2022-11-08 DIAGNOSIS — Z30011 Encounter for initial prescription of contraceptive pills: Secondary | ICD-10-CM

## 2022-11-08 DIAGNOSIS — Z3009 Encounter for other general counseling and advice on contraception: Secondary | ICD-10-CM | POA: Diagnosis not present

## 2022-11-08 DIAGNOSIS — N946 Dysmenorrhea, unspecified: Secondary | ICD-10-CM | POA: Diagnosis not present

## 2022-11-08 MED ORDER — NORETHINDRONE 0.35 MG PO TABS: 1.0000 | ORAL_TABLET | Freq: Every day | ORAL | 11 refills | Status: AC

## 2022-11-08 MED ORDER — VITAFOL ULTRA 29-0.6-0.4-200 MG PO CAPS: 1.0000 | ORAL_CAPSULE | Freq: Every day | ORAL | 11 refills | Status: AC

## 2022-11-08 MED ORDER — IBUPROFEN 800 MG PO TABS: 800.0000 mg | ORAL_TABLET | Freq: Three times a day (TID) | ORAL | 5 refills | Status: AC | PRN

## 2022-11-08 NOTE — Progress Notes (Signed)
32 y.o GYN presents for Follow up.  Reports no concerns today.

## 2022-11-08 NOTE — Progress Notes (Signed)
Patient ID: Niger A Siebers, female   DOB: 1991-05-02, 32 y.o.   MRN: [id number]  Chief Complaint  Patient presents with   Follow-up    HPI Niger A Bouffard is a 32 y.o. female.  History of pelvic pain and vaginitis.  Presents today for follow up.  No complaints.  Denies pelvic pain, vaginal discharge or dysuria. HPI  Past Medical History:  Diagnosis Date   Medical history non-contributory     Past Surgical History:  Procedure Laterality Date   NO PAST SURGERIES      Family History  Problem Relation Age of Onset   Healthy Mother    Heart disease Father    Yves Dill Parkinson White syndrome Brother    Hypertension Maternal Grandfather    COPD Maternal Grandfather    Asthma Neg Hx    Diabetes Neg Hx    Stroke Neg Hx     Social History Social History   Tobacco Use   Smoking status: Never   Smokeless tobacco: Never  Vaping Use   Vaping Use: Never used  Substance Use Topics   Alcohol use: Not Currently    Alcohol/week: 7.0 standard drinks of alcohol    Types: 7 Glasses of wine per week    Comment: drinks on weekends, not since confirmed pregnancy   Drug use: Not Currently    Frequency: 7.0 times per week    Types: Marijuana    Comment: Quit smoking    Allergies  Allergen Reactions   Penicillins Hives    Has patient had a PCN reaction causing immediate rash, facial/tongue/throat swelling, SOB or lightheadedness with hypotension: Yes Has patient had a PCN reaction causing severe rash involving mucus membranes or skin necrosis: Yes Has patient had a PCN reaction that required hospitalization: No Has patient had a PCN reaction occurring within the last 10 years: No If all of the above answers are "NO", then may proceed with Cephalosporin use.    Sulfa Antibiotics Hives    Current Outpatient Medications  Medication Sig Dispense Refill   ibuprofen (ADVIL) 800 MG tablet Take 1 tablet (800 mg total) by mouth every 8 (eight) hours as needed. 30 tablet 5   norethindrone  (MICRONOR) 0.35 MG tablet Take 1 tablet (0.35 mg total) by mouth daily. 28 tablet 11   acetaminophen (TYLENOL) 325 MG tablet Take 2 tablets (650 mg total) by mouth every 4 (four) hours as needed (for pain scale < 4). (Patient not taking: Reported on 08/30/2022) 60 tablet 0   clindamycin (CLEOCIN) 300 MG capsule Take 1 capsule (300 mg total) by mouth 3 (three) times daily. 21 capsule 0   ibuprofen (ADVIL) 600 MG tablet Take 1 tablet (600 mg total) by mouth every 6 (six) hours. 30 tablet 0   ibuprofen (ADVIL) 800 MG tablet Take 1 tablet (800 mg total) by mouth every 8 (eight) hours as needed. 30 tablet 5   Prenat-Fe Poly-Methfol-FA-DHA (VITAFOL ULTRA) 29-0.6-0.4-200 MG CAPS Take 1 capsule by mouth daily. 30 capsule 11   No current facility-administered medications for this visit.    Review of Systems Review of Systems Constitutional: negative for fatigue and weight loss Respiratory: negative for cough and wheezing Cardiovascular: negative for chest pain, fatigue and palpitations Gastrointestinal: negative for abdominal pain and change in bowel habits Genitourinary:negative Integument/breast: negative for nipple discharge Musculoskeletal:negative for myalgias Neurological: negative for gait problems and tremors Behavioral/Psych: negative for abusive relationship, depression Endocrine: negative for temperature intolerance      Blood pressure 108/68, pulse 65,  height 5' 4.5" (1.638 m), weight 162 lb (73.5 kg), currently breastfeeding.  Physical Exam Physical Exam General:   Alert and no distress  Skin:   no rash or abnormalities  Lungs:   clear to auscultation bilaterally  Heart:   regular rate and rhythm, S1, S2 normal, no murmur, click, rub or gallop  Breasts:   normal without suspicious masses, skin or nipple changes or axillary nodes  Abdomen:  normal findings: no organomegaly, soft, non-tender and no hernia  Pelvis:  External genitalia: normal general appearance Urinary system:  urethral meatus normal and bladder without fullness, nontender Vaginal: normal without tenderness, induration or masses Cervix: normal appearance Adnexa: normal bimanual exam Uterus: anteverted and non-tender, normal size    I have spent a total of 20 minutes of face-to-face time, excluding clinical staff time, reviewing notes and preparing to see patient, ordering tests and/or medications, and counseling the patient.   Data Reviewed Wet Prep  Assessment     1. Postpartum care following vaginal delivery Rx: - Prenat-Fe Poly-Methfol-FA-DHA (VITAFOL ULTRA) 29-0.6-0.4-200 MG CAPS; Take 1 capsule by mouth daily.  Dispense: 30 capsule; Refill: 11  2. Dysmenorrhea Rx: - ibuprofen (ADVIL) 800 MG tablet; Take 1 tablet (800 mg total) by mouth every 8 (eight) hours as needed.  Dispense: 30 tablet; Refill: 5  3. Vaginal discharge, resolved  4. Encounter for other general counseling or advice on contraception - breast feeding and wants OCP  5. Encounter for initial prescription of contraceptive pills Rx: - norethindrone (MICRONOR) 0.35 MG tablet; Take 1 tablet (0.35 mg total) by mouth daily.  Dispense: 28 tablet; Refill: 11 - POCT urine pregnancy     Plan   Follow up in 6 weeks  Meds ordered this encounter  Medications   Prenat-Fe Poly-Methfol-FA-DHA (VITAFOL ULTRA) 29-0.6-0.4-200 MG CAPS    Sig: Take 1 capsule by mouth daily.    Dispense:  30 capsule    Refill:  11   ibuprofen (ADVIL) 800 MG tablet    Sig: Take 1 tablet (800 mg total) by mouth every 8 (eight) hours as needed.    Dispense:  30 tablet    Refill:  5   norethindrone (MICRONOR) 0.35 MG tablet    Sig: Take 1 tablet (0.35 mg total) by mouth daily.    Dispense:  28 tablet    Refill:  11     Shelly Bombard, MD 11/08/2022 3:05 PM

## 2022-12-20 ENCOUNTER — Ambulatory Visit: Payer: Medicaid Other | Admitting: Obstetrics

## 2023-03-10 DIAGNOSIS — X58XXXA Exposure to other specified factors, initial encounter: Secondary | ICD-10-CM | POA: Diagnosis not present

## 2023-03-10 DIAGNOSIS — S00551A Superficial foreign body of lip, initial encounter: Secondary | ICD-10-CM | POA: Diagnosis not present

## 2023-05-14 ENCOUNTER — Telehealth: Payer: Medicaid Other

## 2023-05-15 ENCOUNTER — Telehealth: Payer: Medicaid Other

## 2023-05-15 DIAGNOSIS — T3695XA Adverse effect of unspecified systemic antibiotic, initial encounter: Secondary | ICD-10-CM | POA: Diagnosis not present

## 2023-05-15 DIAGNOSIS — B9689 Other specified bacterial agents as the cause of diseases classified elsewhere: Secondary | ICD-10-CM | POA: Diagnosis not present

## 2023-05-15 DIAGNOSIS — N760 Acute vaginitis: Secondary | ICD-10-CM

## 2023-05-15 DIAGNOSIS — B379 Candidiasis, unspecified: Secondary | ICD-10-CM

## 2023-05-15 MED ORDER — FLUCONAZOLE 150 MG PO TABS: 150.0000 mg | ORAL_TABLET | Freq: Once | ORAL | 0 refills | Status: AC

## 2023-05-15 MED ORDER — METRONIDAZOLE 0.75 % VA GEL: 1.0000 | Freq: Every day | VAGINAL | 0 refills | Status: AC

## 2023-05-15 NOTE — Progress Notes (Signed)
Virtual Visit Consent   Tammie A Murri, you are scheduled for a virtual visit with a [facility] provider today. Just as with appointments in the office, your consent must be obtained to participate. Your consent will be active for this visit and any virtual visit you may have with one of our providers in the next 365 days. If you have a MyChart account, a copy of this consent can be sent to you electronically.  As this is a virtual visit, video technology does not allow for your provider to perform a traditional examination. This may limit your provider's ability to fully assess your condition. If your provider identifies any concerns that need to be evaluated in person or the need to arrange testing (such as labs, EKG, etc.), we will make arrangements to do so. Although advances in technology are sophisticated, we cannot ensure that it will always work on either your end or our end. If the connection with a video visit is poor, the visit may have to be switched to a telephone visit. With either a video or telephone visit, we are not always able to ensure that we have a secure connection.  By engaging in this virtual visit, you consent to the provision of healthcare and authorize for your insurance to be billed (if applicable) for the services provided during this visit. Depending on your insurance coverage, you may receive a charge related to this service.  I need to obtain your verbal consent now. Are you willing to proceed with your visit today? Tammie Martinez has provided verbal consent on 05/15/2023 for a virtual visit (video or telephone). Viviano Simas, FNP  Date: 05/15/2023 9:00 AM  Virtual Visit via Video Note   I, Viviano Simas, connected with  Tammie Martinez  ([id number], [birth year]) on 05/15/23 at  9:00 AM EDT by a video-enabled telemedicine application and verified that I am speaking with the correct person using two identifiers.  Location: Patient: Virtual Visit Location Patient:  Home Provider: Virtual Visit Location Provider: Home Office   I discussed the limitations of evaluation and management by telemedicine and the availability of in person appointments. The patient expressed understanding and agreed to proceed.    History of Present Illness: Tammie Martinez is a 32 y.o. who identifies as a female who was assigned female at birth, and is being seen today for concerns of vaginal odor and discharge that she first noted yesterday.   She has had BV and a yeast infection in the past  She denies a risk for STI   Denies chance of pregnancy she is breastfeeding     Problems:  Patient Active Problem List   Diagnosis Date Noted   Encounter for induction of labor 08/21/2022   Polyhydramnios affecting pregnancy 07/12/2022   Alpha thalassemia silent carrier 04/20/2022   Supervision of normal pregnancy 01/21/2022   Lesion of left nipple 09/30/2021   Vitamin D deficiency 09/25/2017    Allergies:  Allergies  Allergen Reactions   Penicillins Hives    Has patient had a PCN reaction causing immediate rash, facial/tongue/throat swelling, SOB or lightheadedness with hypotension: Yes Has patient had a PCN reaction causing severe rash involving mucus membranes or skin necrosis: Yes Has patient had a PCN reaction that required hospitalization: No Has patient had a PCN reaction occurring within the last 10 years: No If all of the above answers are "NO", then may proceed with Cephalosporin use.    Sulfa Antibiotics Hives   Medications:  Current Outpatient  Medications:    acetaminophen (TYLENOL) 325 MG tablet, Take 2 tablets (650 mg total) by mouth every 4 (four) hours as needed (for pain scale < 4). (Patient not taking: Reported on 08/30/2022), Disp: 60 tablet, Rfl: 0   clindamycin (CLEOCIN) 300 MG capsule, Take 1 capsule (300 mg total) by mouth 3 (three) times daily., Disp: 21 capsule, Rfl: 0   ibuprofen (ADVIL) 600 MG tablet, Take 1 tablet (600 mg total) by mouth every 6  (six) hours., Disp: 30 tablet, Rfl: 0   ibuprofen (ADVIL) 800 MG tablet, Take 1 tablet (800 mg total) by mouth every 8 (eight) hours as needed., Disp: 30 tablet, Rfl: 5   ibuprofen (ADVIL) 800 MG tablet, Take 1 tablet (800 mg total) by mouth every 8 (eight) hours as needed., Disp: 30 tablet, Rfl: 5   norethindrone (MICRONOR) 0.35 MG tablet, Take 1 tablet (0.35 mg total) by mouth daily., Disp: 28 tablet, Rfl: 11   Prenat-Fe Poly-Methfol-FA-DHA (VITAFOL ULTRA) 29-0.6-0.4-200 MG CAPS, Take 1 capsule by mouth daily., Disp: 30 capsule, Rfl: 11  Observations/Objective: Patient is well-developed, well-nourished in no acute distress.  Resting comfortably  at home.  Head is normocephalic, atraumatic.  No labored breathing.  Speech is clear and coherent with logical content.  Patient is alert and oriented at baseline.    Assessment and Plan:   1. Bacterial vaginitis  - metroNIDAZOLE (METROGEL) 0.75 % vaginal gel; Place 1 Applicatorful vaginally at bedtime for 7 days.  Dispense: 70 g; Refill: 0  2. Antibiotic-induced yeast infection  - fluconazole (DIFLUCAN) 150 MG tablet; Take 1 tablet (150 mg total) by mouth once for 1 dose.  Dispense: 1 tablet; Refill: 0     Follow Up Instructions: I discussed the assessment and treatment plan with the patient. The patient was provided an opportunity to ask questions and all were answered. The patient agreed with the plan and demonstrated an understanding of the instructions.  A copy of instructions were sent to the patient via MyChart unless otherwise noted below.    The patient was advised to call back or seek an in-person evaluation if the symptoms worsen or if the condition fails to improve as anticipated.  Time:  I spent 12 minutes with the patient via telehealth technology discussing the above problems/concerns.    Viviano Simas, FNP

## 2023-06-30 ENCOUNTER — Telehealth: Payer: Medicaid Other | Admitting: Nurse Practitioner

## 2023-06-30 DIAGNOSIS — N760 Acute vaginitis: Secondary | ICD-10-CM | POA: Diagnosis not present

## 2023-06-30 DIAGNOSIS — B9689 Other specified bacterial agents as the cause of diseases classified elsewhere: Secondary | ICD-10-CM

## 2023-06-30 DIAGNOSIS — B3731 Acute candidiasis of vulva and vagina: Secondary | ICD-10-CM

## 2023-06-30 MED ORDER — METRONIDAZOLE 1 % EX GEL: Freq: Every day | CUTANEOUS | 0 refills | Status: AC

## 2023-06-30 MED ORDER — FLUCONAZOLE 150 MG PO TABS: 150.0000 mg | ORAL_TABLET | Freq: Once | ORAL | 0 refills | Status: AC

## 2023-06-30 MED ORDER — CLOTRIMAZOLE 3 2 % VA CREA: 1.0000 | TOPICAL_CREAM | Freq: Every day | VAGINAL | 0 refills | Status: AC

## 2023-06-30 NOTE — Addendum Note (Signed)
Addended by: Georgana Curio on: 06/30/2023 12:57 PM   Modules accepted: Orders

## 2023-06-30 NOTE — Progress Notes (Signed)

## 2023-07-02 ENCOUNTER — Encounter: Payer: Medicaid Other | Admitting: Nurse Practitioner

## 2023-07-02 DIAGNOSIS — N760 Acute vaginitis: Secondary | ICD-10-CM

## 2023-07-02 MED ORDER — METRONIDAZOLE 0.75 % VA GEL: 1.0000 | Freq: Two times a day (BID) | VAGINAL | 0 refills | Status: DC

## 2023-07-02 NOTE — Progress Notes (Signed)
Good Morning!  The correct metrogel has been sent. There will be no charge for this visit.

## 2023-07-23 DIAGNOSIS — H5213 Myopia, bilateral: Secondary | ICD-10-CM | POA: Diagnosis not present

## 2023-08-02 ENCOUNTER — Telehealth: Payer: Medicaid Other | Admitting: Family Medicine

## 2023-08-02 DIAGNOSIS — N898 Other specified noninflammatory disorders of vagina: Secondary | ICD-10-CM

## 2023-08-02 NOTE — Progress Notes (Signed)
Because you have been treated twice virtually for this and once last month your condition warrants further evaluation and I recommend that you be seen in a face to face visit.   NOTE: There will be NO CHARGE for this eVisit   If you are having a true medical emergency please call 911

## 2023-08-04 ENCOUNTER — Telehealth: Payer: Medicaid Other | Admitting: Physician Assistant

## 2023-08-04 DIAGNOSIS — N760 Acute vaginitis: Secondary | ICD-10-CM

## 2023-08-04 DIAGNOSIS — T3695XA Adverse effect of unspecified systemic antibiotic, initial encounter: Secondary | ICD-10-CM | POA: Diagnosis not present

## 2023-08-04 DIAGNOSIS — B9689 Other specified bacterial agents as the cause of diseases classified elsewhere: Secondary | ICD-10-CM | POA: Diagnosis not present

## 2023-08-04 DIAGNOSIS — B379 Candidiasis, unspecified: Secondary | ICD-10-CM | POA: Diagnosis not present

## 2023-08-04 MED ORDER — FLUCONAZOLE 150 MG PO TABS: 150.0000 mg | ORAL_TABLET | ORAL | 0 refills | Status: DC | PRN

## 2023-08-04 MED ORDER — METRONIDAZOLE 500 MG PO TABS: 500.0000 mg | ORAL_TABLET | Freq: Two times a day (BID) | ORAL | 0 refills | Status: AC

## 2023-08-04 NOTE — Patient Instructions (Signed)
Tammie Martinez, thank you for joining Margaretann Loveless, PA-C for today's virtual visit.  While this provider is not your primary care provider (PCP), if your PCP is located in our provider database this encounter information will be shared with them immediately following your visit.   A [facility] MyChart account gives you access to today's visit and all your visits, tests, and labs performed at [facility] " click here if you don't have a [facility] MyChart account or go to [email or web address]  Consent: (Patient) Tammie A Fluhr provided verbal consent for this virtual visit at the beginning of the encounter.  Current Medications:  Current Outpatient Medications:    fluconazole (DIFLUCAN) 150 MG tablet, Take 1 tablet (150 mg total) by mouth every 3 (three) days as needed., Disp: 2 tablet, Rfl: 0   metroNIDAZOLE (FLAGYL) 500 MG tablet, Take 1 tablet (500 mg total) by mouth 2 (two) times daily for 7 days., Disp: 14 tablet, Rfl: 0   acetaminophen (TYLENOL) 325 MG tablet, Take 2 tablets (650 mg total) by mouth every 4 (four) hours as needed (for pain scale < 4). (Patient not taking: Reported on 08/30/2022), Disp: 60 tablet, Rfl: 0   ibuprofen (ADVIL) 600 MG tablet, Take 1 tablet (600 mg total) by mouth every 6 (six) hours., Disp: 30 tablet, Rfl: 0   ibuprofen (ADVIL) 800 MG tablet, Take 1 tablet (800 mg total) by mouth every 8 (eight) hours as needed., Disp: 30 tablet, Rfl: 5   ibuprofen (ADVIL) 800 MG tablet, Take 1 tablet (800 mg total) by mouth every 8 (eight) hours as needed., Disp: 30 tablet, Rfl: 5   norethindrone (MICRONOR) 0.35 MG tablet, Take 1 tablet (0.35 mg total) by mouth daily., Disp: 28 tablet, Rfl: 11   Prenat-Fe Poly-Methfol-FA-DHA (VITAFOL ULTRA) 29-0.6-0.4-200 MG CAPS, Take 1 capsule by mouth daily., Disp: 30 capsule, Rfl: 11   Medications ordered in this encounter:  Meds ordered this encounter  Medications   metroNIDAZOLE (FLAGYL) 500 MG tablet    Sig: Take  1 tablet (500 mg total) by mouth 2 (two) times daily for 7 days.    Dispense:  14 tablet    Refill:  0    Order Specific Question:   Supervising Provider    Answer:   Merrilee Jansky [[id number]]   fluconazole (DIFLUCAN) 150 MG tablet    Sig: Take 1 tablet (150 mg total) by mouth every 3 (three) days as needed.    Dispense:  2 tablet    Refill:  0    Order Specific Question:   Supervising Provider    Answer:   Merrilee Jansky [id number]     *If you need refills on other medications prior to your next appointment, please contact your pharmacy*  Follow-Up: Call back or seek an in-person evaluation if the symptoms worsen or if the condition fails to improve as anticipated.  [facility] [phone]  Other Instructions Vaginal Probiotics: AZO vaginal probiotic OLLY Happy Hoo-Ha RAW Vaginal Care RenewLife Women's vaginal probiotic RepHresh Pro-B  Vaginal washes: Honey Pot Summer's Eve Vagisil Feminine cleanser  Boric Acid Suppositories  Healthy vaginal hygiene practices    -  Avoid sleeper pajamas. Nightgowns allow air to circulate.  Sleep without underpants whenever possible.   -  Wear cotton underpants during the day. Double-rinse underwear after washing to avoid residual irritants. Do not use fabric softeners for underwear and swimsuits.   - Avoid tights, leotards, leggings, "skinny" jeans, and other tight-fitting clothing.  Skirts and loose-fitting pants allow air to circulate.   - Avoid pantyliners.  Instead use tampons or cotton pads.   - Use the restroom after intercourse to help prevent UTI's   - Daily warm bathing is helpful:     - Soak in clean water (no soap) for 10 to 15 minutes. Adding vinegar or baking soda to the water has not been specifically studied and may not be better than clean water alone.      - Use soap to wash regions other than the genital area just before getting out of the tub. Limit use of any soap on genital areas. Use  fragance-free soaps.     - Rinse the genital area well and gently pat dry.  Don't rub.  Hair dryer to assist with drying can be used only if on cool setting.     - Do not use bubble baths or perfumed soaps.   - Do not use any feminine sprays, douches or powders.  These contain chemicals that will irritate the skin.   - If the genital area is tender or swollen, cool compresses may relieve the discomfort. Unscented wet wipes can be used instead of toilet paper for wiping.    - Emollients, such as Vaseline, may help protect skin and can be applied to the irritated area.   - Always remember to wipe front-to-back after bowel movements. Pat dry after urination.   - Do not sit in wet swimsuits for long periods of time after swimming    If you have been instructed to have an in-person evaluation today at a local Urgent Care facility, please use the link below. It will take you to a list of all of our available [facility], including address, phone number and hours of operation. Please do not delay care.  [facility]  If you or a family member do not have a primary care provider, use the link below to schedule a visit and establish care. When you choose a [facility] primary care physician or advanced practice provider, you gain a long-term partner in health. Find a Primary Care Provider  Learn more about [facility] in-office and virtual care options: [facility] - Get Care Now

## 2023-08-04 NOTE — Progress Notes (Signed)
Virtual Visit Consent   Tammie Martinez, you are scheduled for a virtual visit with a [facility] provider today. Just as with appointments in the office, your consent must be obtained to participate. Your consent will be active for this visit and any virtual visit you may have with one of our providers in the next 365 days. If you have a MyChart account, a copy of this consent can be sent to you electronically.  As this is a virtual visit, video technology does not allow for your provider to perform a traditional examination. This may limit your provider's ability to fully assess your condition. If your provider identifies any concerns that need to be evaluated in person or the need to arrange testing (such as labs, EKG, etc.), we will make arrangements to do so. Although advances in technology are sophisticated, we cannot ensure that it will always work on either your end or our end. If the connection with a video visit is poor, the visit may have to be switched to a telephone visit. With either a video or telephone visit, we are not always able to ensure that we have a secure connection.  By engaging in this virtual visit, you consent to the provision of healthcare and authorize for your insurance to be billed (if applicable) for the services provided during this visit. Depending on your insurance coverage, you may receive a charge related to this service.  I need to obtain your verbal consent now. Are you willing to proceed with your visit today? Tammie Martinez has provided verbal consent on 08/04/2023 for a virtual visit (video or telephone). Margaretann Loveless, PA-C  Date: 08/04/2023 7:52 AM  Virtual Visit via Video Note   I, Margaretann Loveless, connected with  Tammie Martinez  ([id number], [birth year]) on 08/04/23 at  7:45 AM EST by a video-enabled telemedicine application and verified that I am speaking with the correct person using two identifiers.  Location: Patient: Virtual Visit Location  Patient: Home Provider: Virtual Visit Location Provider: Home Office   I discussed the limitations of evaluation and management by telemedicine and the availability of in person appointments. The patient expressed understanding and agreed to proceed.    History of Present Illness: Tammie Martinez is a 32 y.o. who identifies as a female who was assigned female at birth, and is being seen today for vaginal discharge and odor.  HPI: Vaginal Discharge The patient's primary symptoms include a genital odor and vaginal discharge. This is a recurrent problem. The current episode started in the past 7 days. The problem occurs constantly. The problem has been gradually worsening. The pain is moderate. Pertinent negatives include no abdominal pain, chills, dysuria, flank pain, frequency, nausea or sore throat. The vaginal discharge was white, thick and malodorous. There has been no bleeding. She has not been passing clots. She has not been passing tissue. Nothing aggravates the symptoms. She has tried nothing for the symptoms. The treatment provided no relief. She is sexually active. No, her partner does not have an STD.    Problems:  Patient Active Problem List   Diagnosis Date Noted   Encounter for induction of labor 08/21/2022   Polyhydramnios affecting pregnancy 07/12/2022   Alpha thalassemia silent carrier 04/20/2022   Supervision of normal pregnancy 01/21/2022   Lesion of left nipple 09/30/2021   Vitamin D deficiency 09/25/2017    Allergies:  Allergies  Allergen Reactions   Penicillins Hives    Has patient had a PCN reaction  causing immediate rash, facial/tongue/throat swelling, SOB or lightheadedness with hypotension: Yes Has patient had a PCN reaction causing severe rash involving mucus membranes or skin necrosis: Yes Has patient had a PCN reaction that required hospitalization: No Has patient had a PCN reaction occurring within the last 10 years: No If all of the above answers are "NO", then  may proceed with Cephalosporin use.    Sulfa Antibiotics Hives   Medications:  Current Outpatient Medications:    fluconazole (DIFLUCAN) 150 MG tablet, Take 1 tablet (150 mg total) by mouth every 3 (three) days as needed., Disp: 2 tablet, Rfl: 0   metroNIDAZOLE (FLAGYL) 500 MG tablet, Take 1 tablet (500 mg total) by mouth 2 (two) times daily for 7 days., Disp: 14 tablet, Rfl: 0   acetaminophen (TYLENOL) 325 MG tablet, Take 2 tablets (650 mg total) by mouth every 4 (four) hours as needed (for pain scale < 4). (Patient not taking: Reported on 08/30/2022), Disp: 60 tablet, Rfl: 0   ibuprofen (ADVIL) 600 MG tablet, Take 1 tablet (600 mg total) by mouth every 6 (six) hours., Disp: 30 tablet, Rfl: 0   ibuprofen (ADVIL) 800 MG tablet, Take 1 tablet (800 mg total) by mouth every 8 (eight) hours as needed., Disp: 30 tablet, Rfl: 5   ibuprofen (ADVIL) 800 MG tablet, Take 1 tablet (800 mg total) by mouth every 8 (eight) hours as needed., Disp: 30 tablet, Rfl: 5   norethindrone (MICRONOR) 0.35 MG tablet, Take 1 tablet (0.35 mg total) by mouth daily., Disp: 28 tablet, Rfl: 11   Prenat-Fe Poly-Methfol-FA-DHA (VITAFOL ULTRA) 29-0.6-0.4-200 MG CAPS, Take 1 capsule by mouth daily., Disp: 30 capsule, Rfl: 11  Observations/Objective: Patient is well-developed, well-nourished in no acute distress.  Resting comfortably at home.  Head is normocephalic, atraumatic.  No labored breathing.  Speech is clear and coherent with logical content.  Patient is alert and oriented at baseline.    Assessment and Plan: 1. BV (bacterial vaginosis) - metroNIDAZOLE (FLAGYL) 500 MG tablet; Take 1 tablet (500 mg total) by mouth 2 (two) times daily for 7 days.  Dispense: 14 tablet; Refill: 0  2. Antibiotic-induced yeast infection - fluconazole (DIFLUCAN) 150 MG tablet; Take 1 tablet (150 mg total) by mouth every 3 (three) days as needed.  Dispense: 2 tablet; Refill: 0  - Symptoms consistent with BV - Metronidazole  prescribed - Limit bubble baths, scented lotions/soaps/detergents - Limit tight fitting clothing - Diflucan given as prophylaxis as patient tends to get vaginal yeast infections with antibiotic use - Seek on person evaluation if not improving or if symptoms worsen   Follow Up Instructions: I discussed the assessment and treatment plan with the patient. The patient was provided an opportunity to ask questions and all were answered. The patient agreed with the plan and demonstrated an understanding of the instructions.  A copy of instructions were sent to the patient via MyChart unless otherwise noted below.    The patient was advised to call back or seek an in-person evaluation if the symptoms worsen or if the condition fails to improve as anticipated.    Margaretann Loveless, PA-C

## 2023-08-15 ENCOUNTER — Encounter: Payer: Self-pay | Admitting: Advanced Practice Midwife

## 2023-08-15 ENCOUNTER — Ambulatory Visit (INDEPENDENT_AMBULATORY_CARE_PROVIDER_SITE_OTHER): Payer: Medicaid Other | Admitting: Advanced Practice Midwife

## 2023-08-15 ENCOUNTER — Other Ambulatory Visit (HOSPITAL_COMMUNITY)
Admission: RE | Admit: 2023-08-15 | Discharge: 2023-08-15 | Disposition: A | Discharge status: Home / Self Care | Payer: Medicaid Other | Source: Ambulatory Visit | Attending: Advanced Practice Midwife | Admitting: Advanced Practice Midwife

## 2023-08-15 VITALS — BP 108/65 | HR 65 | Ht 64.5 in | Wt 167.8 lb

## 2023-08-15 DIAGNOSIS — N6342 Unspecified lump in left breast, subareolar: Secondary | ICD-10-CM

## 2023-08-15 DIAGNOSIS — Z113 Encounter for screening for infections with a predominantly sexual mode of transmission: Secondary | ICD-10-CM | POA: Diagnosis not present

## 2023-08-15 DIAGNOSIS — N898 Other specified noninflammatory disorders of vagina: Secondary | ICD-10-CM | POA: Insufficient documentation

## 2023-08-15 DIAGNOSIS — Z872 Personal history of diseases of the skin and subcutaneous tissue: Secondary | ICD-10-CM

## 2023-08-15 DIAGNOSIS — Z3009 Encounter for other general counseling and advice on contraception: Secondary | ICD-10-CM | POA: Diagnosis not present

## 2023-08-15 DIAGNOSIS — Z01419 Encounter for gynecological examination (general) (routine) without abnormal findings: Secondary | ICD-10-CM | POA: Diagnosis not present

## 2023-08-15 MED ORDER — FLUCONAZOLE 150 MG PO TABS: ORAL_TABLET | ORAL | 0 refills | Status: DC

## 2023-08-15 NOTE — Progress Notes (Signed)
Pt presents for AEX.  Last PAP 03/2021 Requesting STD testing  Pt c/o recurring BV

## 2023-08-15 NOTE — Progress Notes (Signed)
Subjective:     Tammie Martinez is a 32 y.o. female here at [facility] for a routine exam.  Current complaints: frequent BV since new partner in July, after treatment, yeast infection symptoms.  Personal and family health history reviewed: yes.  Do you have a primary care provider? yes Do you feel safe at home? yes  Flowsheet Row Postpartum Visit from 11/08/2022 in [facility] Total Score 0       Health Maintenance Due  Topic Date Due   INFLUENZA VACCINE  Never done   COVID-19 Vaccine (1 - 2023-24 season) Never done     Risk factors for chronic health problems: Smoking: Sometimes vapes Alchohol/how much: socially, on weekends Pt BMI: Body mass index is 28.36 kg/m.   Gynecologic History Patient's last menstrual period was 07/16/2023. Contraception: none Last Pap: 03/25/21. Results were: normal Last mammogram: n/a.   Obstetric History OB History  Gravida Para Term Preterm AB Living  6 3 3   3 3   SAB IAB Ectopic Multiple Live Births  3     0 3    # Outcome Date GA Lbr Len/2nd Weight Sex Type Anes PTL Lv  6 Term 08/22/22 105w1d 16:34 / 00:31 7 lb 11 oz (3.487 kg) M Vag-Spont EPI  LIV  5 SAB 07/2021          4 Term 02/27/18 [redacted]w[redacted]d 04:38 / 00:24 7 lb 15.5 oz (3.615 kg) M Vag-Spont EPI  LIV  3 SAB 02/2017 [redacted]w[redacted]d         2 Term 03/17/10 [redacted]w[redacted]d   M Vag-Spont   LIV  1 SAB 2009 [redacted]w[redacted]d       DEC     The following portions of the patient's history were reviewed and updated as appropriate: allergies, current medications, past family history, past medical history, past social history, past surgical history, and problem list.  Review of Systems Pertinent items noted in HPI and remainder of comprehensive ROS otherwise negative.    Objective:  BP 108/65   Pulse 65   Ht 5' 4.5" (1.638 m)   Wt 167 lb 12.8 oz (76.1 kg)   LMP 07/16/2023   BMI 28.36 kg/m   VS reviewed, nursing note reviewed,  Constitutional: well developed, well nourished,  no distress HEENT: normocephalic, thyroid without enlargement or mass HEART: RRR, no murmurs rubs/gallops RESP: clear and equal to auscultation bilaterally in all lobes  Breast Exam:  exam performed: right breast normal without mass, skin or nipple changes or axillary nodes, left breast normal without mass, skin or nipple changes or axillary nodes Abdomen: soft Neuro: alert and oriented x 3 Skin: warm, dry Psych: affect normal Pelvic exam: Deferred--self swab collected     Assessment/Plan:   1. Encounter for annual routine gynecological examination   2. Routine screening for STI (sexually transmitted infection)  - Cervicovaginal ancillary only( [place]) - HIV antibody (with reflex) - RPR - Hepatitis B Surface AntiGEN - Hepatitis C Antibody  3. Vaginal discharge  - Cervicovaginal ancillary only( [place])  4. Subareolar mass of left breast --See hx of abscess below --Pt is also breastfeeding so palpable tissue behind left areola may be changes related to breastfeeding or scarring previous infection.   --Will evaluate with imaging for reassurance.   - [place] LIMITED ULTRASOUND INCLUDING AXILLA LEFT BREAST ; Future - MM Digital Diagnostic Unilat L; Future  5. History of breast abscess --Pt was treated for recurrent abscess behind  left areola vs nipple infection in 2022.  Infection cleared but scarring vs small mass palpable on exam today. - [place] LIMITED ULTRASOUND INCLUDING AXILLA LEFT BREAST ; Future - MM Digital Diagnostic Unilat L; Future  6. Vaginal itching  - fluconazole (DIFLUCAN) 150 MG tablet; Take one tablet now, and one in 3 days. If you are treated for bacterial vaginosis, wait until the treatment is completed to take the second tablet.  Dispense: 2 tablet; Refill: 0    7. Encounter for counseling regarding contraception --Discussed pt contraceptive plans and reviewed contraceptive methods based on pt preferences and effectiveness.  Pt prefers  to wait for now  but considering IUD. Encouraged use of condoms for STI prevention/pregnancy prevention.    Return in about 1 year (around 08/14/2024) for annual exam.   Sharen Counter, CNM 5:07 PM

## 2023-08-16 LAB — RPR: RPR Ser Ql: NONREACTIVE

## 2023-08-16 LAB — HEPATITIS B SURFACE ANTIGEN: Hepatitis B Surface Ag: NEGATIVE

## 2023-08-16 LAB — HEPATITIS C ANTIBODY: Hep C Virus Ab: NONREACTIVE

## 2023-08-16 LAB — HIV ANTIBODY (ROUTINE TESTING W REFLEX): HIV Screen 4th Generation wRfx: NONREACTIVE

## 2023-08-18 LAB — CERVICOVAGINAL ANCILLARY ONLY
Bacterial Vaginitis (gardnerella): NEGATIVE
Candida Glabrata: NEGATIVE
Candida Vaginitis: NEGATIVE
Chlamydia: NEGATIVE
Comment: NEGATIVE
Comment: NEGATIVE
Comment: NEGATIVE
Comment: NEGATIVE
Comment: NEGATIVE
Comment: NORMAL
Neisseria Gonorrhea: NEGATIVE
Trichomonas: NEGATIVE

## 2023-09-26 ENCOUNTER — Ambulatory Visit
Admission: RE | Admit: 2023-09-26 | Discharge: 2023-09-26 | Disposition: A | Discharge status: Home / Self Care | Payer: Medicaid Other | Source: Ambulatory Visit | Attending: Advanced Practice Midwife | Admitting: Advanced Practice Midwife

## 2023-09-26 DIAGNOSIS — N6342 Unspecified lump in left breast, subareolar: Secondary | ICD-10-CM | POA: Diagnosis not present

## 2023-09-26 DIAGNOSIS — Z872 Personal history of diseases of the skin and subcutaneous tissue: Secondary | ICD-10-CM

## 2023-09-26 DIAGNOSIS — N6320 Unspecified lump in the left breast, unspecified quadrant: Secondary | ICD-10-CM | POA: Diagnosis not present

## 2023-11-27 ENCOUNTER — Telehealth: Admitting: Physician Assistant

## 2023-11-27 DIAGNOSIS — B379 Candidiasis, unspecified: Secondary | ICD-10-CM | POA: Diagnosis not present

## 2023-11-27 DIAGNOSIS — T3695XA Adverse effect of unspecified systemic antibiotic, initial encounter: Secondary | ICD-10-CM | POA: Diagnosis not present

## 2023-11-27 DIAGNOSIS — N760 Acute vaginitis: Secondary | ICD-10-CM

## 2023-11-27 DIAGNOSIS — B9689 Other specified bacterial agents as the cause of diseases classified elsewhere: Secondary | ICD-10-CM | POA: Diagnosis not present

## 2023-11-27 MED ORDER — FLUCONAZOLE 150 MG PO TABS: 150.0000 mg | ORAL_TABLET | ORAL | 0 refills | Status: DC | PRN

## 2023-11-27 MED ORDER — METRONIDAZOLE 500 MG PO TABS: 500.0000 mg | ORAL_TABLET | Freq: Two times a day (BID) | ORAL | 0 refills | Status: AC

## 2023-11-27 NOTE — Addendum Note (Signed)
 Addended by: Margaretann Loveless on: 11/27/2023 07:27 AM   Modules accepted: Orders

## 2023-11-27 NOTE — Progress Notes (Signed)

## 2023-12-21 ENCOUNTER — Telehealth: Admitting: Family Medicine

## 2023-12-21 ENCOUNTER — Telehealth: Admitting: Physician Assistant

## 2023-12-21 DIAGNOSIS — N760 Acute vaginitis: Secondary | ICD-10-CM

## 2023-12-21 DIAGNOSIS — B9689 Other specified bacterial agents as the cause of diseases classified elsewhere: Secondary | ICD-10-CM | POA: Diagnosis not present

## 2023-12-21 MED ORDER — METRONIDAZOLE 500 MG PO TABS: 500.0000 mg | ORAL_TABLET | Freq: Two times a day (BID) | ORAL | 0 refills | Status: DC

## 2023-12-21 MED ORDER — FLUCONAZOLE 150 MG PO TABS: ORAL_TABLET | ORAL | 0 refills | Status: DC

## 2023-12-21 NOTE — Progress Notes (Signed)
 Virtual Visit Consent   Tammie A Buttacavoli, you are scheduled for a virtual visit with a [facility] provider today. Just as with appointments in the office, your consent must be obtained to participate. Your consent will be active for this visit and any virtual visit you may have with one of our providers in the next 365 days. If you have a MyChart account, a copy of this consent can be sent to you electronically.  As this is a virtual visit, video technology does not allow for your provider to perform a traditional examination. This may limit your provider's ability to fully assess your condition. If your provider identifies any concerns that need to be evaluated in person or the need to arrange testing (such as labs, EKG, etc.), we will make arrangements to do so. Although advances in technology are sophisticated, we cannot ensure that it will always work on either your end or our end. If the connection with a video visit is poor, the visit may have to be switched to a telephone visit. With either a video or telephone visit, we are not always able to ensure that we have a secure connection.  By engaging in this virtual visit, you consent to the provision of healthcare and authorize for your insurance to be billed (if applicable) for the services provided during this visit. Depending on your insurance coverage, you may receive a charge related to this service.  I need to obtain your verbal consent now. Are you willing to proceed with your visit today? Tammie A Diego has provided verbal consent on 12/21/2023 for a virtual visit (video or telephone). Piedad Climes, [place]  Date: 12/21/2023 4:38 PM   Virtual Visit via Video Note   I, Piedad Climes, connected with  Tammie A Clauss  ([id number], 1991-09-04) on 12/21/23 at  4:30 PM EDT by a video-enabled telemedicine application and verified that I am speaking with the correct person using two identifiers.  Location: Patient: Virtual Visit Location Patient:  Home Provider: Virtual Visit Location Provider: Home Office   I discussed the limitations of evaluation and management by telemedicine and the availability of in person appointments. The patient expressed understanding and agreed to proceed.    History of Present Illness: Tammie Martinez is a 33 y.o. who identifies as a female who was assigned female at birth, and is being seen today for concern of another flare of bacterial vaginosis.  Has been having recurring issues with BV.  Is followed by her OB/GYN who is working on a preventative regimen for her.  Was previously on prebiotic's as recommended by her specialist.  Has been out for a month but has some ordered and is ready to restart.  Patient notes over the past few days having slight discharge, now over the past 24 hours with substantial foul odor.  Denies pain or itch.  Denies change to soaps or lotions.  Last menstrual period ended 317.  Denies concern for pregnancy or STI.  Most recently treated for the same issue about a month ago, endorsing complete resolution.  HPI: HPI  Problems:  Patient Active Problem List   Diagnosis Date Noted   Alpha thalassemia silent carrier 04/20/2022   Lesion of left nipple 09/30/2021   Vitamin D deficiency 09/25/2017    Allergies:  Allergies  Allergen Reactions   Penicillins Hives    Has patient had a PCN reaction causing immediate rash, facial/tongue/throat swelling, SOB or lightheadedness with hypotension: Yes Has patient had a PCN reaction  causing severe rash involving mucus membranes or skin necrosis: Yes Has patient had a PCN reaction that required hospitalization: No Has patient had a PCN reaction occurring within the last 10 years: No If all of the above answers are "NO", then may proceed with Cephalosporin use.    Sulfa Antibiotics Hives   Medications:  Current Outpatient Medications:    fluconazole (DIFLUCAN) 150 MG tablet, Take 1 tablet PO once. Repeat in 3 days if needed., Disp: 2 tablet,  Rfl: 0   metroNIDAZOLE (FLAGYL) 500 MG tablet, Take 1 tablet (500 mg total) by mouth 2 (two) times daily., Disp: 14 tablet, Rfl: 0   acetaminophen (TYLENOL) 325 MG tablet, Take 2 tablets (650 mg total) by mouth every 4 (four) hours as needed (for pain scale < 4). (Patient not taking: Reported on 08/30/2022), Disp: 60 tablet, Rfl: 0   ibuprofen (ADVIL) 600 MG tablet, Take 1 tablet (600 mg total) by mouth every 6 (six) hours. (Patient not taking: Reported on 08/15/2023), Disp: 30 tablet, Rfl: 0   ibuprofen (ADVIL) 800 MG tablet, Take 1 tablet (800 mg total) by mouth every 8 (eight) hours as needed. (Patient not taking: Reported on 08/15/2023), Disp: 30 tablet, Rfl: 5   ibuprofen (ADVIL) 800 MG tablet, Take 1 tablet (800 mg total) by mouth every 8 (eight) hours as needed. (Patient not taking: Reported on 08/15/2023), Disp: 30 tablet, Rfl: 5   norethindrone (MICRONOR) 0.35 MG tablet, Take 1 tablet (0.35 mg total) by mouth daily. (Patient not taking: Reported on 08/15/2023), Disp: 28 tablet, Rfl: 11   Prenat-Fe Poly-Methfol-FA-DHA (VITAFOL ULTRA) 29-0.6-0.4-200 MG CAPS, Take 1 capsule by mouth daily., Disp: 30 capsule, Rfl: 11  Observations/Objective: Patient is well-developed, well-nourished in no acute distress.  Resting comfortably  at home.  Head is normocephalic, atraumatic.  No labored breathing.  Speech is clear and coherent with logical content.  Patient is alert and oriented at baseline.   Assessment and Plan: 1. Bacterial vaginitis (Primary) - metroNIDAZOLE (FLAGYL) 500 MG tablet; Take 1 tablet (500 mg total) by mouth 2 (two) times daily.  Dispense: 14 tablet; Refill: 0  Discussed need to restart prebiotic's as recommended by her GYN.  Also recommended boric acid vaginal suppositories as a preventative strategy.  Follow-up with GYN as scheduled.  Will prescribe metronidazole to take twice daily as directed.  She is aware that if there are any nonresolving or quickly reoccurring symptoms,  she has to be evaluated in person.  Follow Up Instructions: I discussed the assessment and treatment plan with the patient. The patient was provided an opportunity to ask questions and all were answered. The patient agreed with the plan and demonstrated an understanding of the instructions.  A copy of instructions were sent to the patient via MyChart unless otherwise noted below.   The patient was advised to call back or seek an in-person evaluation if the symptoms worsen or if the condition fails to improve as anticipated.    Piedad Climes, PA-C

## 2023-12-21 NOTE — Progress Notes (Signed)
  Because you had recent treatment in the last year. Your condition warrants further evaluation and it is recommend that you be seen in a face-to-face visit at your PCP and or a local urgent care.    NOTE: There will be NO CHARGE for this E-Visit   If you are having a true medical emergency, please call 911.

## 2023-12-21 NOTE — Patient Instructions (Signed)
 Tammie A Bothwell, thank you for joining Piedad Climes, PA-C for today's virtual visit.  While this provider is not your primary care provider (PCP), if your PCP is located in our provider database this encounter information will be shared with them immediately following your visit.   A [facility] MyChart account gives you access to today's visit and all your visits, tests, and labs performed at [facility] " click here if you don't have a [facility] MyChart account or go to [email or web address]  Consent: (Patient) Tammie Martinez provided verbal consent for this virtual visit at the beginning of the encounter.  Current Medications:  Current Outpatient Medications:    acetaminophen (TYLENOL) 325 MG tablet, Take 2 tablets (650 mg total) by mouth every 4 (four) hours as needed (for pain scale < 4). (Patient not taking: Reported on 08/30/2022), Disp: 60 tablet, Rfl: 0   ibuprofen (ADVIL) 600 MG tablet, Take 1 tablet (600 mg total) by mouth every 6 (six) hours. (Patient not taking: Reported on 08/15/2023), Disp: 30 tablet, Rfl: 0   ibuprofen (ADVIL) 800 MG tablet, Take 1 tablet (800 mg total) by mouth every 8 (eight) hours as needed. (Patient not taking: Reported on 08/15/2023), Disp: 30 tablet, Rfl: 5   ibuprofen (ADVIL) 800 MG tablet, Take 1 tablet (800 mg total) by mouth every 8 (eight) hours as needed. (Patient not taking: Reported on 08/15/2023), Disp: 30 tablet, Rfl: 5   norethindrone (MICRONOR) 0.35 MG tablet, Take 1 tablet (0.35 mg total) by mouth daily. (Patient not taking: Reported on 08/15/2023), Disp: 28 tablet, Rfl: 11   Prenat-Fe Poly-Methfol-FA-DHA (VITAFOL ULTRA) 29-0.6-0.4-200 MG CAPS, Take 1 capsule by mouth daily., Disp: 30 capsule, Rfl: 11   Medications ordered in this encounter:  No orders of the defined types were placed in this encounter.    *If you need refills on other medications prior to your next appointment, please contact your  pharmacy*  Follow-Up: Call back or seek an in-person evaluation if the symptoms worsen or if the condition fails to improve as anticipated.  [facility] [phone]  Other Instructions Boric Acid Vaginal Suppositories What is this medication? BORIC ACID (BOHR ik AS id) may support vaginal health. It may relieve the symptoms of a yeast infection, such as itching, burning, and odor. This medicine may be used for other purposes; ask your health care provider or pharmacist if you have questions. COMMON BRAND NAME(S): AZO Boric Acid with Aloe Vera, Hylafem What should I tell my care team before I take this medication? They need to know if you have any of these conditions: Diabetes Frequent infections HIV or AIDS Immune system problems An unusual or allergic reaction to boric acid, other medications, foods, dyes, or preservatives Pregnant or trying to get pregnant Breast-feeding How should I use this medication? This medication is for use in the vagina. Do not take by mouth. Follow the directions on the prescription label. Read package directions carefully before using. Wash hands before and after use. Use this medication at bedtime, unless otherwise directed by your care team. Do not use your medication more often than directed. Do not stop using this medication except on your care team's advice. Talk to your care team about the use of this medication in children. This medication is not approved for use in children. Overdosage: If you think you have taken too much of this medicine contact a poison control center or emergency room at once. NOTE: This medicine is only for you.  Do not share this medicine with others. What if I miss a dose? If you miss a dose, use it as soon as you can. If it is almost time for your next dose, use only that dose. Do not use double or extra doses. What may interact with this medication? Interactions are not expected. Do not use any other vaginal  products without telling your care team. This list may not describe all possible interactions. Give your health care provider a list of all the medicines, herbs, non-prescription drugs, or dietary supplements you use. Also tell them if you smoke, drink alcohol, or use illegal drugs. Some items may interact with your medicine. What should I watch for while using this medication? Tell your care team if your symptoms do not start to get better within a few days. It is better not to have sex until you have finished your treatment. This medication may cause condoms, diaphragms, and spermicides to not work as well. Do not rely on any of these methods to prevent sexually transmitted infections (STIs) or pregnancy while you are using this medication. Vaginal medications may come out of the vagina during treatment. To keep the medication from getting on your clothing, wear a panty liner. The use of tampons is not recommended. To help clear up the infection, wear freshly washed cotton, not synthetic, underwear. What side effects may I notice from receiving this medication? Side effects that you should report to your care team as soon as possible: Allergic reactions--skin rash, itching, hives, swelling of the face, lips, tongue, or throat Unusual vaginal discharge, itching, or odor Side effects that usually do not require medical attention (report to your care team if they continue or are bothersome): Vaginal irritation at the application site This list may not describe all possible side effects. Call your doctor for medical advice about side effects. You may report side effects to FDA at 1-800-FDA-1088. Where should I keep my medication? Keep out of the reach of children and pets. Store in a cool, dry place between 15 and 30 degrees C (59 and 86 degrees F). Keep away from sunlight. Throw away any unused medication after the expiration date. NOTE: This sheet is a summary. It may not cover all possible  information. If you have questions about this medicine, talk to your doctor, pharmacist, or health care provider.  2024 Elsevier/Gold Standard (2021-08-23 00:00:00)   If you have been instructed to have an in-person evaluation today at a local Urgent Care facility, please use the link below. It will take you to a list of all of our available [facility], including address, phone number and hours of operation. Please do not delay care.  [facility]  If you or a family member do not have a primary care provider, use the link below to schedule a visit and establish care. When you choose a [facility] primary care physician or advanced practice provider, you gain a long-term partner in health. Find a Primary Care Provider  Learn more about [facility]'s in-office and virtual care options: [facility] - Get Care Now

## 2023-12-26 ENCOUNTER — Other Ambulatory Visit (HOSPITAL_COMMUNITY)
Admission: RE | Admit: 2023-12-26 | Discharge: 2023-12-26 | Disposition: A | Discharge status: Home / Self Care | Source: Ambulatory Visit | Attending: Obstetrics and Gynecology | Admitting: Obstetrics and Gynecology

## 2023-12-26 ENCOUNTER — Ambulatory Visit: Admitting: *Deleted

## 2023-12-26 VITALS — BP 105/67 | HR 49

## 2023-12-26 DIAGNOSIS — B9689 Other specified bacterial agents as the cause of diseases classified elsewhere: Secondary | ICD-10-CM | POA: Insufficient documentation

## 2023-12-26 DIAGNOSIS — N760 Acute vaginitis: Secondary | ICD-10-CM | POA: Diagnosis not present

## 2023-12-26 DIAGNOSIS — Z113 Encounter for screening for infections with a predominantly sexual mode of transmission: Secondary | ICD-10-CM | POA: Diagnosis not present

## 2023-12-26 NOTE — Progress Notes (Signed)
 SUBJECTIVE:  33 y.o. female who desires a STI screen. Denies abnormal vaginal discharge, bleeding or significant pelvic pain. No UTI symptoms. Denies history of known exposure to STD.  No LMP recorded.  OBJECTIVE:  She appears well.   ASSESSMENT:  STI Screen   PLAN:  Pt offered STI blood screening-requested GC, chlamydia, and trichomonas probe sent to lab.  Treatment: To be determined once lab results are received.  Pt follow up as needed.  *Pt had Virtual Urgent Care Visit prior to today and is taking Flagyl already

## 2023-12-27 ENCOUNTER — Encounter: Payer: Self-pay | Admitting: Obstetrics and Gynecology

## 2023-12-27 LAB — HEPATITIS C ANTIBODY: Hep C Virus Ab: NONREACTIVE

## 2023-12-27 LAB — CERVICOVAGINAL ANCILLARY ONLY
Bacterial Vaginitis (gardnerella): POSITIVE — AB
Candida Glabrata: NEGATIVE
Candida Vaginitis: NEGATIVE
Chlamydia: NEGATIVE
Comment: NEGATIVE
Comment: NEGATIVE
Comment: NEGATIVE
Comment: NEGATIVE
Comment: NEGATIVE
Comment: NORMAL
Neisseria Gonorrhea: NEGATIVE
Trichomonas: NEGATIVE

## 2023-12-27 LAB — HIV ANTIBODY (ROUTINE TESTING W REFLEX): HIV Screen 4th Generation wRfx: NONREACTIVE

## 2023-12-27 LAB — HEPATITIS B SURFACE ANTIGEN: Hepatitis B Surface Ag: NEGATIVE

## 2023-12-27 LAB — RPR: RPR Ser Ql: NONREACTIVE

## 2024-01-29 ENCOUNTER — Telehealth: Admitting: Physician Assistant

## 2024-01-29 DIAGNOSIS — T3695XA Adverse effect of unspecified systemic antibiotic, initial encounter: Secondary | ICD-10-CM

## 2024-01-29 DIAGNOSIS — B9689 Other specified bacterial agents as the cause of diseases classified elsewhere: Secondary | ICD-10-CM

## 2024-01-29 DIAGNOSIS — N760 Acute vaginitis: Secondary | ICD-10-CM | POA: Diagnosis not present

## 2024-01-29 DIAGNOSIS — B379 Candidiasis, unspecified: Secondary | ICD-10-CM | POA: Diagnosis not present

## 2024-01-29 MED ORDER — METRONIDAZOLE 500 MG PO TABS: 500.0000 mg | ORAL_TABLET | Freq: Two times a day (BID) | ORAL | 0 refills | Status: AC

## 2024-01-29 MED ORDER — FLUCONAZOLE 150 MG PO TABS: 150.0000 mg | ORAL_TABLET | ORAL | 0 refills | Status: DC | PRN

## 2024-01-29 NOTE — Addendum Note (Signed)
 Addended by: Angelia Kelp on: 01/29/2024 03:59 PM   Modules accepted: Orders

## 2024-01-29 NOTE — Progress Notes (Signed)

## 2024-03-04 ENCOUNTER — Telehealth: Admitting: Family Medicine

## 2024-03-04 DIAGNOSIS — B9689 Other specified bacterial agents as the cause of diseases classified elsewhere: Secondary | ICD-10-CM | POA: Diagnosis not present

## 2024-03-04 DIAGNOSIS — N760 Acute vaginitis: Secondary | ICD-10-CM | POA: Diagnosis not present

## 2024-03-04 DIAGNOSIS — B3731 Acute candidiasis of vulva and vagina: Secondary | ICD-10-CM | POA: Diagnosis not present

## 2024-03-04 DIAGNOSIS — B379 Candidiasis, unspecified: Secondary | ICD-10-CM | POA: Diagnosis not present

## 2024-03-04 DIAGNOSIS — T3695XA Adverse effect of unspecified systemic antibiotic, initial encounter: Secondary | ICD-10-CM | POA: Diagnosis not present

## 2024-03-04 MED ORDER — METRONIDAZOLE 500 MG PO TABS: 500.0000 mg | ORAL_TABLET | Freq: Three times a day (TID) | ORAL | 0 refills | Status: AC

## 2024-03-04 MED ORDER — FLUCONAZOLE 150 MG PO TABS: 150.0000 mg | ORAL_TABLET | ORAL | 0 refills | Status: DC | PRN

## 2024-03-04 NOTE — Progress Notes (Signed)
 E-Visit for Vaginal Symptoms  We are sorry that you are not feeling well. Here is how we plan to help! Based on what you shared with me it looks like you: May have a vaginosis due to bacteria  Vaginosis is an inflammation of the vagina that can result in discharge, itching and pain. The cause is usually a change in the normal balance of vaginal bacteria or an infection. Vaginosis can also result from reduced estrogen levels after menopause.  The most common causes of vaginosis are:   Bacterial vaginosis which results from an overgrowth of one on several organisms that are normally present in your vagina.   Yeast infections which are caused by a naturally occurring fungus called candida.   Vaginal atrophy (atrophic vaginosis) which results from the thinning of the vagina from reduced estrogen levels after menopause.   Trichomoniasis which is caused by a parasite and is commonly transmitted by sexual intercourse.  Factors that increase your risk of developing vaginosis include: Medications, such as antibiotics and steroids Uncontrolled diabetes Use of hygiene products such as bubble bath, vaginal spray or vaginal deodorant Douching Wearing damp or tight-fitting clothing Using an intrauterine device (IUD) for birth control Hormonal changes, such as those associated with pregnancy, birth control pills or menopause Sexual activity Having a sexually transmitted infection  Your treatment plan is Metronidazole  or Flagyl  500mg  twice a day for 7 days.  I have electronically sent this prescription into the pharmacy that you have chosen. I also sent diflucan  for yeast.   Be sure to take all of the medication as directed. Stop taking any medication if you develop a rash, tongue swelling or shortness of breath. Mothers who are breast feeding should consider pumping and discarding their breast milk while on these antibiotics. However, there is no consensus that infant exposure at these doses would be  harmful.  Remember that medication creams can weaken latex condoms. Aaron Aas   HOME CARE:  Good hygiene may prevent some types of vaginosis from recurring and may relieve some symptoms:  Avoid baths, hot tubs and whirlpool spas. Rinse soap from your outer genital area after a shower, and dry the area well to prevent irritation. Don't use scented or harsh soaps, such as those with deodorant or antibacterial action. Avoid irritants. These include scented tampons and pads. Wipe from front to back after using the toilet. Doing so avoids spreading fecal bacteria to your vagina.  Other things that may help prevent vaginosis include:  Don't douche. Your vagina doesn't require cleansing other than normal bathing. Repetitive douching disrupts the normal organisms that reside in the vagina and can actually increase your risk of vaginal infection. Douching won't clear up a vaginal infection. Use a latex condom. Both female and female latex condoms may help you avoid infections spread by sexual contact. Wear cotton underwear. Also wear pantyhose with a cotton crotch. If you feel comfortable without it, skip wearing underwear to bed. Yeast thrives in [employer] Your symptoms should improve in the next day or two.  GET HELP RIGHT AWAY IF:  You have pain in your lower abdomen ( pelvic area or over your ovaries) You develop nausea or vomiting You develop a fever Your discharge changes or worsens You have persistent pain with intercourse You develop shortness of breath, a rapid pulse, or you faint.  These symptoms could be signs of problems or infections that need to be evaluated by a medical provider now.  MAKE SURE YOU   Understand these instructions. Will  watch your condition. Will get help right away if you are not doing well or get worse.  Thank you for choosing an e-visit.  Your e-visit answers were reviewed by a board certified advanced clinical practitioner to complete your personal care  plan. Depending upon the condition, your plan could have included both over the counter or prescription medications.  Please review your pharmacy choice. Make sure the pharmacy is open so you can pick up prescription now. If there is a problem, you may contact your provider through [employer] and have the prescription routed to another pharmacy.  Your safety is important to us . If you have drug allergies check your prescription carefully.   For the next 24 hours you can use MyChart to ask questions about today's visit, request a non-urgent call back, or ask for a work or school excuse. You will get an email in the next two days asking about your experience. I hope that your e-visit has been valuable and will speed your recovery.    have provided 5 minutes of non face to face time during this encounter for chart review and documentation.

## 2024-04-24 ENCOUNTER — Telehealth: Admitting: Family Medicine

## 2024-04-24 DIAGNOSIS — T3695XA Adverse effect of unspecified systemic antibiotic, initial encounter: Secondary | ICD-10-CM

## 2024-04-24 DIAGNOSIS — B3731 Acute candidiasis of vulva and vagina: Secondary | ICD-10-CM

## 2024-04-24 DIAGNOSIS — B9689 Other specified bacterial agents as the cause of diseases classified elsewhere: Secondary | ICD-10-CM

## 2024-04-24 DIAGNOSIS — B379 Candidiasis, unspecified: Secondary | ICD-10-CM

## 2024-04-24 DIAGNOSIS — N760 Acute vaginitis: Secondary | ICD-10-CM

## 2024-04-24 MED ORDER — METRONIDAZOLE 500 MG PO TABS: 500.0000 mg | ORAL_TABLET | Freq: Three times a day (TID) | ORAL | 0 refills | Status: AC

## 2024-04-24 MED ORDER — FLUCONAZOLE 150 MG PO TABS: 150.0000 mg | ORAL_TABLET | ORAL | 0 refills | Status: DC | PRN

## 2024-04-24 NOTE — Patient Instructions (Signed)

## 2024-04-24 NOTE — Progress Notes (Signed)
 Virtual Visit Consent   Tammie Martinez, you are scheduled for a virtual visit with a  provider today. Just as with appointments in the office, your consent must be obtained to participate. Your consent will be active for this visit and any virtual visit you may have with one of our providers in the next 365 days. If you have a MyChart account, a copy of this consent can be sent to you electronically.  As this is a virtual visit, video technology does not allow for your provider to perform a traditional examination. This may limit your provider's ability to fully assess your condition. If your provider identifies any concerns that need to be evaluated in person or the need to arrange testing (such as labs, EKG, etc.), we will make arrangements to do so. Although advances in technology are sophisticated, we cannot ensure that it will always work on either your end or our end. If the connection with a video visit is poor, the visit may have to be switched to a telephone visit. With either a video or telephone visit, we are not always able to ensure that we have a secure connection.  By engaging in this virtual visit, you consent to the provision of healthcare and authorize for your insurance to be billed (if applicable) for the services provided during this visit. Depending on your insurance coverage, you may receive a charge related to this service.  I need to obtain your verbal consent now. Are you willing to proceed with your visit today? Tammie Martinez has provided verbal consent on 04/24/2024 for a virtual visit (video or telephone). Loa Lamp, FNP  Date: 04/24/2024 6:47 PM   Virtual Visit via Video Note   I, Loa Lamp, connected with  Tammie Martinez  ([id number], 1990/12/07) on 04/24/24 at  6:45 PM EDT by a video-enabled telemedicine application and verified that I am speaking with the correct person using two identifiers.  Location: Patient: Virtual Visit Location Patient: Home Provider:  Virtual Visit Location Provider: Home Office   I discussed the limitations of evaluation and management by telemedicine and the availability of in person appointments. The patient expressed understanding and agreed to proceed.    History of Present Illness: Tammie A Gottlieb is a 33 y.o. who identifies as a female who was assigned female at birth, and is being seen today for vaginal discharge thin with odor and mild itching. She says she also needs diflucan  because the antibiotics give her a yeast infection. SABRA  HPI: HPI  Problems:  Patient Active Problem List   Diagnosis Date Noted   Alpha thalassemia silent carrier 04/20/2022   Lesion of left nipple 09/30/2021   Vitamin D  deficiency 09/25/2017    Allergies:  Allergies  Allergen Reactions   Penicillins Hives    Has patient had a PCN reaction causing immediate rash, facial/tongue/throat swelling, SOB or lightheadedness with hypotension: Yes Has patient had a PCN reaction causing severe rash involving mucus membranes or skin necrosis: Yes Has patient had a PCN reaction that required hospitalization: No Has patient had a PCN reaction occurring within the last 10 years: No If all of the above answers are NO, then may proceed with Cephalosporin use.    Sulfa  Antibiotics Hives   Medications:  Current Outpatient Medications:    acetaminophen  (TYLENOL ) 325 MG tablet, Take 2 tablets (650 mg total) by mouth every 4 (four) hours as needed (for pain scale < 4). (Patient not taking: Reported on 08/30/2022), Disp: 60 tablet,  Rfl: 0   fluconazole  (DIFLUCAN ) 150 MG tablet, Take 1 tablet (150 mg total) by mouth every 3 (three) days as needed., Disp: 2 tablet, Rfl: 0   ibuprofen  (ADVIL ) 600 MG tablet, Take 1 tablet (600 mg total) by mouth every 6 (six) hours. (Patient not taking: Reported on 08/15/2023), Disp: 30 tablet, Rfl: 0   ibuprofen  (ADVIL ) 800 MG tablet, Take 1 tablet (800 mg total) by mouth every 8 (eight) hours as needed. (Patient not taking:  Reported on 08/15/2023), Disp: 30 tablet, Rfl: 5   ibuprofen  (ADVIL ) 800 MG tablet, Take 1 tablet (800 mg total) by mouth every 8 (eight) hours as needed. (Patient not taking: Reported on 08/15/2023), Disp: 30 tablet, Rfl: 5   norethindrone  (MICRONOR ) 0.35 MG tablet, Take 1 tablet (0.35 mg total) by mouth daily. (Patient not taking: Reported on 08/15/2023), Disp: 28 tablet, Rfl: 11   Prenat-Fe Poly-Methfol-FA-DHA (VITAFOL  ULTRA) 29-0.6-0.4-200 MG CAPS, Take 1 capsule by mouth daily., Disp: 30 capsule, Rfl: 11  Observations/Objective: Patient is well-developed, well-nourished in no acute distress.  Resting comfortably  at home.  Head is normocephalic, atraumatic.  No labored breathing.  Speech is clear and coherent with logical content.  Patient is alert and oriented at baseline.    Assessment and Plan: 1. BV (bacterial vaginosis) (Primary)  2. Candidiasis of vagina  F/U with gyn or UC as needed.   Follow Up Instructions: I discussed the assessment and treatment plan with the patient. The patient was provided an opportunity to ask questions and all were answered. The patient agreed with the plan and demonstrated an understanding of the instructions.  A copy of instructions were sent to the patient via MyChart unless otherwise noted below.     The patient was advised to call back or seek an in-person evaluation if the symptoms worsen or if the condition fails to improve as anticipated.    Staley Lunz, FNP

## 2024-06-19 ENCOUNTER — Telehealth

## 2024-06-19 ENCOUNTER — Telehealth: Admitting: Physician Assistant

## 2024-06-19 DIAGNOSIS — T3695XA Adverse effect of unspecified systemic antibiotic, initial encounter: Secondary | ICD-10-CM

## 2024-06-19 DIAGNOSIS — B9689 Other specified bacterial agents as the cause of diseases classified elsewhere: Secondary | ICD-10-CM | POA: Diagnosis not present

## 2024-06-19 DIAGNOSIS — B379 Candidiasis, unspecified: Secondary | ICD-10-CM | POA: Diagnosis not present

## 2024-06-19 DIAGNOSIS — N760 Acute vaginitis: Secondary | ICD-10-CM

## 2024-06-19 MED ORDER — FLUCONAZOLE 150 MG PO TABS: ORAL_TABLET | ORAL | 0 refills | Status: DC

## 2024-06-19 MED ORDER — METRONIDAZOLE 500 MG PO TABS: 500.0000 mg | ORAL_TABLET | Freq: Two times a day (BID) | ORAL | 0 refills | Status: DC

## 2024-06-19 NOTE — Progress Notes (Signed)
 We are sorry that you are not feeling well. Here is how we plan to help! Based on what you shared with me it looks like you: May have a vaginosis due to bacteria  Vaginosis is an inflammation of the vagina that can result in discharge, itching and pain. The cause is usually a change in the normal balance of vaginal bacteria or an infection. Vaginosis can also result from reduced estrogen levels after menopause.  The most common causes of vaginosis are:   Bacterial vaginosis which results from an overgrowth of one on several organisms that are normally present in your vagina.   Yeast infections which are caused by a naturally occurring fungus called candida.   Vaginal atrophy (atrophic vaginosis) which results from the thinning of the vagina from reduced estrogen levels after menopause.   Trichomoniasis which is caused by a parasite and is commonly transmitted by sexual intercourse.  Factors that increase your risk of developing vaginosis include: Medications, such as antibiotics and steroids Uncontrolled diabetes Use of hygiene products such as bubble bath, vaginal spray or vaginal deodorant Douching Wearing damp or tight-fitting clothing Using an intrauterine device (IUD) for birth control Hormonal changes, such as those associated with pregnancy, birth control pills or menopause Sexual activity Having a sexually transmitted infection  Your treatment plan is Metronidazole  or Flagyl  500mg  twice a day for 7 days.  I have electronically sent this prescription into the pharmacy that you have chosen. I have also sent in a course of Diflucan  in case of antibiotic-induced yeast.   Be sure to take all of the medication as directed. Stop taking any medication if you develop a rash, tongue swelling or shortness of breath. Mothers who are breast feeding should consider pumping and discarding their breast milk while on these antibiotics. However, there is no consensus that infant exposure at these  doses would be harmful.  Remember that medication creams can weaken latex condoms.   HOME CARE:  Good hygiene may prevent some types of vaginosis from recurring and may relieve some symptoms:  Avoid baths, hot tubs and whirlpool spas. Rinse soap from your outer genital area after a shower, and dry the area well to prevent irritation. Don't use scented or harsh soaps, such as those with deodorant or antibacterial action. Avoid irritants. These include scented tampons and pads. Wipe from front to back after using the toilet. Doing so avoids spreading fecal bacteria to your vagina.  Other things that may help prevent vaginosis include:  Don't douche. Your vagina doesn't require cleansing other than normal bathing. Repetitive douching disrupts the normal organisms that reside in the vagina and can actually increase your risk of vaginal infection. Douching won't clear up a vaginal infection. Use a latex condom. Both female and female latex condoms may help you avoid infections spread by sexual contact. Wear cotton underwear. Also wear pantyhose with a cotton crotch. If you feel comfortable without it, skip wearing underwear to bed. Yeast thrives in [employer] Your symptoms should improve in the next day or two.  GET HELP RIGHT AWAY IF:  You have pain in your lower abdomen ( pelvic area or over your ovaries) You develop nausea or vomiting You develop a fever Your discharge changes or worsens You have persistent pain with intercourse You develop shortness of breath, a rapid pulse, or you faint.  These symptoms could be signs of problems or infections that need to be evaluated by a medical provider now.  MAKE SURE YOU   Understand these  instructions. Will watch your condition. Will get help right away if you are not doing well or get worse.  Your e-visit answers were reviewed by a board certified advanced clinical practitioner to complete your personal care plan. Depending upon the  condition, your plan could have included both over the counter or prescription medications. Please review your pharmacy choice to make sure that you have choses a pharmacy that is open for you to pick up any needed prescription, Your safety is important to us . If you have drug allergies check your prescription carefully.   You can use MyChart to ask questions about today's visit, request a non-urgent call back, or ask for a work or school excuse for 24 hours related to this e-Visit. If it has been greater than 24 hours you will need to follow up with your provider, or enter a new e-Visit to address those concerns. You will get a MyChart message within the next two days asking about your experience. I hope that your e-visit has been valuable and will speed your recovery.  I have spent 5 minutes in review of e-visit questionnaire, review and updating patient chart, medical decision making and response to patient.   Elsie Velma Lunger, PA-C

## 2024-06-24 ENCOUNTER — Ambulatory Visit

## 2024-06-24 ENCOUNTER — Other Ambulatory Visit (HOSPITAL_COMMUNITY)
Admission: RE | Admit: 2024-06-24 | Discharge: 2024-06-24 | Disposition: A | Discharge status: Home / Self Care | Source: Ambulatory Visit | Attending: Obstetrics | Admitting: Obstetrics

## 2024-06-24 VITALS — BP 107/70 | HR 70

## 2024-06-24 DIAGNOSIS — Z113 Encounter for screening for infections with a predominantly sexual mode of transmission: Secondary | ICD-10-CM | POA: Insufficient documentation

## 2024-06-24 NOTE — Progress Notes (Signed)
 SUBJECTIVE:  33 y.o. female who desires a STI screen. Denies abnormal vaginal discharge, bleeding or significant pelvic pain. No UTI symptoms. Denies history of known exposure to STD.  Patient's last menstrual period was 06/02/2024.  OBJECTIVE:  She appears well.   ASSESSMENT:  STI Screen   PLAN:  Pt offered STI blood screening-requested GC, chlamydia, and trichomonas probe sent to lab.  Treatment: To be determined once lab results are received.  Pt follow up as needed.

## 2024-06-25 LAB — CERVICOVAGINAL ANCILLARY ONLY
Bacterial Vaginitis (gardnerella): POSITIVE — AB
Candida Glabrata: NEGATIVE
Candida Vaginitis: POSITIVE — AB
Chlamydia: NEGATIVE
Comment: NEGATIVE
Comment: NEGATIVE
Comment: NEGATIVE
Comment: NEGATIVE
Comment: NEGATIVE
Comment: NORMAL
Neisseria Gonorrhea: NEGATIVE
Trichomonas: NEGATIVE

## 2024-06-25 LAB — RPR: RPR Ser Ql: NONREACTIVE

## 2024-06-25 LAB — HEPATITIS C ANTIBODY: Hep C Virus Ab: NONREACTIVE

## 2024-06-25 LAB — HEPATITIS B SURFACE ANTIGEN: Hepatitis B Surface Ag: NEGATIVE

## 2024-06-25 LAB — HIV ANTIBODY (ROUTINE TESTING W REFLEX): HIV Screen 4th Generation wRfx: NONREACTIVE

## 2024-06-26 ENCOUNTER — Ambulatory Visit: Payer: Self-pay | Admitting: Obstetrics and Gynecology

## 2024-07-11 ENCOUNTER — Other Ambulatory Visit: Payer: Self-pay

## 2024-07-11 DIAGNOSIS — B379 Candidiasis, unspecified: Secondary | ICD-10-CM

## 2024-07-11 DIAGNOSIS — B9689 Other specified bacterial agents as the cause of diseases classified elsewhere: Secondary | ICD-10-CM

## 2024-07-11 MED ORDER — FLUCONAZOLE 150 MG PO TABS: ORAL_TABLET | ORAL | 0 refills | Status: DC

## 2024-07-11 MED ORDER — METRONIDAZOLE 500 MG PO TABS: 500.0000 mg | ORAL_TABLET | Freq: Two times a day (BID) | ORAL | 0 refills | Status: DC

## 2024-07-25 ENCOUNTER — Telehealth: Admitting: Family Medicine

## 2024-07-25 ENCOUNTER — Telehealth: Admitting: Physician Assistant

## 2024-07-25 DIAGNOSIS — M545 Low back pain, unspecified: Secondary | ICD-10-CM | POA: Diagnosis not present

## 2024-07-25 DIAGNOSIS — J029 Acute pharyngitis, unspecified: Secondary | ICD-10-CM

## 2024-07-25 DIAGNOSIS — B9689 Other specified bacterial agents as the cause of diseases classified elsewhere: Secondary | ICD-10-CM | POA: Diagnosis not present

## 2024-07-25 DIAGNOSIS — R509 Fever, unspecified: Secondary | ICD-10-CM

## 2024-07-25 DIAGNOSIS — N898 Other specified noninflammatory disorders of vagina: Secondary | ICD-10-CM

## 2024-07-25 DIAGNOSIS — N760 Acute vaginitis: Secondary | ICD-10-CM

## 2024-07-25 MED ORDER — CYCLOBENZAPRINE HCL 10 MG PO TABS: 10.0000 mg | ORAL_TABLET | Freq: Three times a day (TID) | ORAL | 0 refills | Status: AC | PRN

## 2024-07-25 MED ORDER — CLINDAMYCIN HCL 300 MG PO CAPS: 300.0000 mg | ORAL_CAPSULE | Freq: Two times a day (BID) | ORAL | 0 refills | Status: AC

## 2024-07-25 MED ORDER — NAPROXEN 500 MG PO TABS: 500.0000 mg | ORAL_TABLET | Freq: Two times a day (BID) | ORAL | 0 refills | Status: AC

## 2024-07-25 NOTE — Progress Notes (Signed)
 Virtual Visit Consent   Tammie Martinez, you are scheduled for a virtual visit with a [facility] provider today. Just as with appointments in the office, your consent must be obtained to participate. Your consent will be active for this visit and any virtual visit you may have with one of our providers in the next 365 days. If you have a MyChart account, a copy of this consent can be sent to you electronically.  As this is a virtual visit, video technology does not allow for your provider to perform a traditional examination. This may limit your provider's ability to fully assess your condition. If your provider identifies any concerns that need to be evaluated in person or the need to arrange testing (such as labs, EKG, etc.), we will make arrangements to do so. Although advances in technology are sophisticated, we cannot ensure that it will always work on either your end or our end. If the connection with a video visit is poor, the visit may have to be switched to a telephone visit. With either a video or telephone visit, we are not always able to ensure that we have a secure connection.  By engaging in this virtual visit, you consent to the provision of healthcare and authorize for your insurance to be billed (if applicable) for the services provided during this visit. Depending on your insurance coverage, you may receive a charge related to this service.  I need to obtain your verbal consent now. Are you willing to proceed with your visit today? Miroslava A Daughenbaugh has provided verbal consent on 07/25/2024 for a virtual visit (video or telephone). Loa Lamp, FNP  Date: 07/25/2024 5:04 PM   Virtual Visit via Video Note   I, Loa Lamp, connected with  Arah A Achee  ([id number], 25-Oct-1990) on 07/25/24 at  5:00 PM EST by a video-enabled telemedicine application and verified that I am speaking with the correct person using two identifiers.  Location: Patient: Virtual Visit Location Patient:  Home Provider: Virtual Visit Location Provider: Home Office   I discussed the limitations of evaluation and management by telemedicine and the availability of in person appointments. The patient expressed understanding and agreed to proceed.    History of Present Illness: Tammie Martinez is a 33 y.o. who identifies as a female who was assigned female at birth, and is being seen today for chronic low back pain flaring since epidurals. No fever. She works at a [employer] with increased physical work. She requests meds and a note OOW to rest her back. She also complains of BV treated with flagyl  3 weeks ago with thin white discharge and odor returning. She plans to see her GYN but would like another treatment. She was tested- see chart and neg for STD's.  HPI: HPI  Problems:  Patient Active Problem List   Diagnosis Date Noted   Alpha thalassemia silent carrier 04/20/2022   Lesion of left nipple 09/30/2021   Vitamin D  deficiency 09/25/2017    Allergies:  Allergies  Allergen Reactions   Penicillins Hives    Has patient had a PCN reaction causing immediate rash, facial/tongue/throat swelling, SOB or lightheadedness with hypotension: Yes Has patient had a PCN reaction causing severe rash involving mucus membranes or skin necrosis: Yes Has patient had a PCN reaction that required hospitalization: No Has patient had a PCN reaction occurring within the last 10 years: No If all of the above answers are NO, then may proceed with Cephalosporin use.    Sulfa   Antibiotics Hives   Medications:  Current Outpatient Medications:    acetaminophen  (TYLENOL ) 325 MG tablet, Take 2 tablets (650 mg total) by mouth every 4 (four) hours as needed (for pain scale < 4). (Patient not taking: Reported on 08/30/2022), Disp: 60 tablet, Rfl: 0   fluconazole  (DIFLUCAN ) 150 MG tablet, Take 1 tablet PO once. Repeat in 3 days if needed., Disp: 2 tablet, Rfl: 0   ibuprofen  (ADVIL ) 600 MG tablet, Take 1 tablet (600 mg total) by  mouth every 6 (six) hours. (Patient not taking: Reported on 08/15/2023), Disp: 30 tablet, Rfl: 0   ibuprofen  (ADVIL ) 800 MG tablet, Take 1 tablet (800 mg total) by mouth every 8 (eight) hours as needed. (Patient not taking: Reported on 08/15/2023), Disp: 30 tablet, Rfl: 5   ibuprofen  (ADVIL ) 800 MG tablet, Take 1 tablet (800 mg total) by mouth every 8 (eight) hours as needed. (Patient not taking: Reported on 08/15/2023), Disp: 30 tablet, Rfl: 5   metroNIDAZOLE  (FLAGYL ) 500 MG tablet, Take 1 tablet (500 mg total) by mouth 2 (two) times daily., Disp: 14 tablet, Rfl: 0   norethindrone  (MICRONOR ) 0.35 MG tablet, Take 1 tablet (0.35 mg total) by mouth daily. (Patient not taking: Reported on 08/15/2023), Disp: 28 tablet, Rfl: 11   Prenat-Fe Poly-Methfol-FA-DHA (VITAFOL  ULTRA) 29-0.6-0.4-200 MG CAPS, Take 1 capsule by mouth daily., Disp: 30 capsule, Rfl: 11  Observations/Objective: Patient is well-developed, well-nourished in no acute distress.  Resting comfortably  at home.  Head is normocephalic, atraumatic.  No labored breathing.  Speech is clear and coherent with logical content.  Patient is alert and oriented at baseline.    Assessment and Plan: 1. Acute low back pain without sciatica, unspecified back pain laterality (Primary)  2. BV (bacterial vaginosis)  Follow up with pcp and gyn. UC as needed.   Follow Up Instructions: I discussed the assessment and treatment plan with the patient. The patient was provided an opportunity to ask questions and all were answered. The patient agreed with the plan and demonstrated an understanding of the instructions.  A copy of instructions were sent to the patient via MyChart unless otherwise noted below.     The patient was advised to call back or seek an in-person evaluation if the symptoms worsen or if the condition fails to improve as anticipated.    Ridge Lafond, FNP

## 2024-07-25 NOTE — Progress Notes (Signed)
  Because you have multiple complaints, I feel your condition warrants further evaluation and I recommend that you be seen in a face-to-face visit. You may need strep testing, covid and flu testing all for the fever, body aches and sore throat. They can treat your BV and also can provide a work note for you when you are seen in person.    NOTE: There will be NO CHARGE for this E-Visit   If you are having a true medical emergency, please call 911.     For an urgent face to face visit, [facility] has multiple urgent care centers for your convenience.  Click the link below for the full list of locations and hours, walk-in wait times, appointment scheduling options and driving directions:  Urgent Care - [place], [place], [place], [place], [place], [place]  [place]     Your MyChart E-visit questionnaire answers were reviewed by a board certified advanced clinical practitioner to complete your personal care plan based on your specific symptoms.    Thank you for using e-Visits.

## 2024-07-25 NOTE — Patient Instructions (Signed)
 Vaginal Infection (Bacterial Vaginosis): What to Know  Bacterial vaginosis is an infection of the vagina. It happens when the balance of normal germs (bacteria) in the vagina changes. It's common among females ages 36 to 49. If left untreated, it can increase your risk of getting a sexually transmitted infection (STI). If you're pregnant, you need to get treated right away. This infection can cause a baby to be born early or at a low birth weight. What are the causes? This happens when too many harmful germs grow in the vagina. The exact reason why this happens isn't known. You can't get this infection from toilet seats, bedding, swimming pools, or contact with objects around you. What increases the risk? Having new or multiple sexual partners, or unprotected sex. Douching. Using an intrauterine device (IUD). Smoking. Alcohol and drug abuse. Taking certain antibiotics. Being pregnant. You can get a vaginal infection without being sexually active. However, it most often occurs in sexually active females. What are the signs or symptoms? Some females have no symptoms. If you have symptoms, they may include: [place] or white vaginal discharge. It can be watery or foamy. A fish-like smell, especially after sex or during your menstrual period. Itching in and around the vagina. Burning or pain with peeing. How is this diagnosed? This infection is diagnosed based on: Your medical history. A physical exam of the vagina. Checking a sample of vaginal fluid for harmful bacteria or uncommon cells. How is this treated? This condition is treated with antibiotics. These may be given as: A pill. A cream for your vagina. A medicine that you put into your vagina called a suppository. If the infection comes back, you may need more antibiotics. Follow these instructions at home: Medicines Take your medicines only as told. Take or apply your antibiotics as told. Do not stop using them even if you start to  feel better. General instructions If you have a female sexual partner, tell her about the infection. She should see her health care provider. Female partners don't need treatment. Avoid sex until treatment is complete. Drink more fluids as told. Keep the area around your vagina and rectum clean. Wash the area daily with warm water. Wipe yourself from front to back after pooping. If you're breastfeeding, talk to your provider about continuing during treatment. How is this prevented? Self-care Do not douche or use vaginal deodorant sprays. Douching can upset the balance of good and harmful bacteria in the vagina, which can cause an infection to happen again. Wear cotton or cotton-lined underwear. Avoid wearing tight pants or pantyhose, especially in the summer. Safe sex Use condoms correctly and every time you have sex. Use dental dams to protect yourself during oral sex. Limit the number of sexual partners. Get tested for STIs. Your sexual partner should also get tested. Drugs and alcohol Do not smoke, vape, or use nicotine or tobacco. Do not use drugs. Limit the amount of alcohol you drink because it can lead to risky sexual behavior. Where to find more information To learn more: Go to tonerpromos.no. Click Health Topics A-Z. Type bacterial vaginosis in the search box. American Sexual Health Association (ASHA): ashasexualhealth.org U.S. Department of Health and Health And Safety Inspector, Office on Women's Health: travellesson.ca Contact a health care provider if: Your symptoms don't get better, even after treatment. You have more discharge or pain when peeing. You have a fever or chills. You have pain in your belly or pelvis. You have pain during sex. You have vaginal bleeding between menstrual periods.  This information is not intended to replace advice given to you by your health care provider. Make sure you discuss any questions you have with your health care provider. Document Revised:  02/22/2023 Document Reviewed: 02/22/2023 Elsevier Patient Education  2024 Elsevier Inc.Lumbar Sprain A lumbar sprain, which is sometimes called a low-back sprain, is a stretch or tear in the ligaments in the lower back (lumbar spine). Ligaments are the bands of tissue that connect bones to each other. This type of injury occurs when you stretch the ligaments beyond their limits. Lumbar sprains can range from mild to severe. Mild sprains may involve stretching a ligament without tearing it. These may heal in 1-2 weeks. More severe sprains involve tearing of the ligament. These will cause more pain and may take 6-8 weeks to heal. What are the causes? This condition may be caused by: Trauma, such as a fall or a hit to the body. Twisting or overstretching the back. This may result from doing activities that take a lot of energy, such as lifting heavy objects. What increases the risk? A lumbar sprain is more common in: Athletes. People with obesity. People who do repeated lifting, bending, or other movements that involve their back. What are the signs or symptoms? Symptoms of this condition may include: Sharp or dull pain in the lower back that does not go away. The pain may spread to the buttocks. Stiffness or limited range of motion. Sudden muscle tightening (spasms). How is this diagnosed? This condition may be diagnosed based on: Your symptoms. Your medical history. A physical exam. Imaging tests, such as: X-rays. MRI. How is this treated? Treatment for this condition may include: Resting the injured area. Applying heat and cold to the affected area. Over-the-counter medicines for pain and inflammation, such as NSAIDs. Prescription medicine for pain or to relax the muscles. These may be needed for a short time. Physical therapy exercises to improve movement and strength. Follow these instructions at home: Managing pain, stiffness, and swelling     If told, put ice on the injured  area during the first 24 hours after your injury. Put ice in a plastic bag. Place a towel between your skin and the bag. Leave the ice on for 20 minutes, 2-3 times a day. If told, apply heat to the affected area as often as told by your health care provider. Use the heat source that your health care provider recommends, such as a moist heat pack or a heating pad. Place a towel between your skin and the heat source. Leave the heat on for 20-30 minutes. If your skin turns bright red, remove the ice or heat right away to prevent skin damage. The risk of damage is higher if you cannot feel pain, heat, or cold. Activity Rest and return to your normal activities as told by your health care provider. Ask your health care provider what activities are safe for you. Do exercises as told by your health care provider. General instructions Take over-the-counter and prescription medicines only as told by your health care provider. Ask your health care provider if the medicine prescribed to you: Requires you to avoid driving or using machinery. Can cause constipation. You may need to take these actions to prevent or treat constipation: Drink enough fluid to keep your urine pale yellow. Take over-the-counter or prescription medicines. Eat foods that are high in fiber, such as beans, whole grains, and fresh fruits and vegetables. Limit foods that are high in fat and processed sugars, such as  fried or sweet foods. Do not use any products that contain nicotine or tobacco. These products include cigarettes, chewing tobacco, and vaping devices, such as e-cigarettes. If you need help quitting, ask your health care provider. How is this prevented? To prevent a future low-back injury: Always warm up properly before physical activity or sports. Cool down and stretch after being active. Use correct form when playing sports and lifting heavy objects. Bend your knees before you lift heavy objects. Use good posture  when sitting and standing. Stay physically fit and keep a healthy weight. Do at least 150 minutes of moderate-intensity exercise each week, such as brisk walking or water aerobics. Do strength exercises at least 2 times each week. Contact a health care provider if: Your back pain does not improve after several weeks of treatment. Your symptoms get worse. You have a fever. Get help right away if: Your back pain is severe. You are unable to stand or walk. You develop pain in your legs. You have weakness in your buttocks or legs. You have trouble controlling when you urinate or when you have a bowel movement. You have frequent, painful, or bloody urination. This information is not intended to replace advice given to you by your health care provider. Make sure you discuss any questions you have with your health care provider. Document Revised: 03/29/2022 Document Reviewed: 03/29/2022 Elsevier Patient Education  2024 [employer].

## 2024-07-27 ENCOUNTER — Telehealth: Admitting: Family Medicine

## 2024-07-27 DIAGNOSIS — M545 Low back pain, unspecified: Secondary | ICD-10-CM | POA: Diagnosis not present

## 2024-07-27 NOTE — Progress Notes (Signed)
 Virtual Visit Consent   Tammie Martinez, you are scheduled for a virtual visit with a [facility] provider today. Just as with appointments in the office, your consent must be obtained to participate. Your consent will be active for this visit and any virtual visit you may have with one of our providers in the next 365 days. If you have a MyChart account, a copy of this consent can be sent to you electronically.  As this is a virtual visit, video technology does not allow for your provider to perform a traditional examination. This may limit your provider's ability to fully assess your condition. If your provider identifies any concerns that need to be evaluated in person or the need to arrange testing (such as labs, EKG, etc.), we will make arrangements to do so. Although advances in technology are sophisticated, we cannot ensure that it will always work on either your end or our end. If the connection with a video visit is poor, the visit may have to be switched to a telephone visit. With either a video or telephone visit, we are not always able to ensure that we have a secure connection.  By engaging in this virtual visit, you consent to the provision of healthcare and authorize for your insurance to be billed (if applicable) for the services provided during this visit. Depending on your insurance coverage, you may receive a charge related to this service.  I need to obtain your verbal consent now. Are you willing to proceed with your visit today? Tammie Martinez has provided verbal consent on 07/27/2024 for a virtual visit (video or telephone). Roosvelt Mater, [place]  Date: 07/27/2024 8:35 AM   Virtual Visit via Video Note   I, Roosvelt Mater, connected with  Tammie Martinez  ([id number], [birth year]) on 07/27/24 at  8:30 AM EST by a video-enabled telemedicine application and verified that I am speaking with the correct person using two identifiers.  Location: Patient: Virtual Visit Location Patient:  Home Provider: Virtual Visit Location Provider: Home Office   I discussed the limitations of evaluation and management by telemedicine and the availability of in person appointments. The patient expressed understanding and agreed to proceed.    History of Present Illness: Tammie Martinez is a 33 y.o. who identifies as a female who was assigned female at birth, and is being seen today for c/o having a virtual visit on Thursday night for back pain.  Pt states she has a history of three epidurals and when the weather changes she has spasms. Pt states she does a lot of heavy lifting at work.  Pt states she is needing a note for work for today and tomorrow so she can continue to get rest.  Pt states she has not been able to pick up muscle relaxer but her mother will be picking them up for her today.   HPI: HPI  Problems:  Patient Active Problem List   Diagnosis Date Noted   Alpha thalassemia silent carrier 04/20/2022   Lesion of left nipple 09/30/2021   Vitamin D  deficiency 09/25/2017    Allergies:  Allergies  Allergen Reactions   Penicillins Hives    Has patient had a PCN reaction causing immediate rash, facial/tongue/throat swelling, SOB or lightheadedness with hypotension: Yes Has patient had a PCN reaction causing severe rash involving mucus membranes or skin necrosis: Yes Has patient had a PCN reaction that required hospitalization: No Has patient had a PCN reaction occurring within the last 10 years: No  If all of the above answers are NO, then may proceed with Cephalosporin use.    Sulfa  Antibiotics Hives   Medications:  Current Outpatient Medications:    acetaminophen  (TYLENOL ) 325 MG tablet, Take 2 tablets (650 mg total) by mouth every 4 (four) hours as needed (for pain scale < 4). (Patient not taking: Reported on 08/30/2022), Disp: 60 tablet, Rfl: 0   clindamycin  (CLEOCIN ) 300 MG capsule, Take 1 capsule (300 mg total) by mouth 2 (two) times daily for 7 days., Disp: 14 capsule, Rfl:  0   cyclobenzaprine  (FLEXERIL ) 10 MG tablet, Take 1 tablet (10 mg total) by mouth 3 (three) times daily as needed for muscle spasms., Disp: 30 tablet, Rfl: 0   fluconazole  (DIFLUCAN ) 150 MG tablet, Take 1 tablet PO once. Repeat in 3 days if needed., Disp: 2 tablet, Rfl: 0   ibuprofen  (ADVIL ) 600 MG tablet, Take 1 tablet (600 mg total) by mouth every 6 (six) hours. (Patient not taking: Reported on 08/15/2023), Disp: 30 tablet, Rfl: 0   ibuprofen  (ADVIL ) 800 MG tablet, Take 1 tablet (800 mg total) by mouth every 8 (eight) hours as needed. (Patient not taking: Reported on 08/15/2023), Disp: 30 tablet, Rfl: 5   ibuprofen  (ADVIL ) 800 MG tablet, Take 1 tablet (800 mg total) by mouth every 8 (eight) hours as needed. (Patient not taking: Reported on 08/15/2023), Disp: 30 tablet, Rfl: 5   metroNIDAZOLE  (FLAGYL ) 500 MG tablet, Take 1 tablet (500 mg total) by mouth 2 (two) times daily., Disp: 14 tablet, Rfl: 0   naproxen (NAPROSYN) 500 MG tablet, Take 1 tablet (500 mg total) by mouth 2 (two) times daily with a meal for 15 days., Disp: 30 tablet, Rfl: 0   norethindrone  (MICRONOR ) 0.35 MG tablet, Take 1 tablet (0.35 mg total) by mouth daily. (Patient not taking: Reported on 08/15/2023), Disp: 28 tablet, Rfl: 11   Prenat-Fe Poly-Methfol-FA-DHA (VITAFOL  ULTRA) 29-0.6-0.4-200 MG CAPS, Take 1 capsule by mouth daily., Disp: 30 capsule, Rfl: 11  Observations/Objective: Patient is well-developed, well-nourished in no acute distress.  Resting comfortably at home.  Head is normocephalic, atraumatic.  No labored breathing.  Speech is clear and coherent with logical content.  Patient is alert and oriented at baseline.    Assessment and Plan: 1. Acute low back pain without sciatica, unspecified back pain laterality (Primary)  -Pt advised to take medications as prescribed -Work note provided -Pt advised to follow up in person urgent care or emergency room for worsening symptoms.   Follow Up Instructions: I  discussed the assessment and treatment plan with the patient. The patient was provided an opportunity to ask questions and all were answered. The patient agreed with the plan and demonstrated an understanding of the instructions.  A copy of instructions were sent to the patient via MyChart unless otherwise noted below.     The patient was advised to call back or seek an in-person evaluation if the symptoms worsen or if the condition fails to improve as anticipated.    Roosvelt Mater, PA-C

## 2024-07-27 NOTE — Patient Instructions (Signed)
 Sameria A Deiss, thank you for joining Roosvelt Mater, PA-C for today's virtual visit.  While this provider is not your primary care provider (PCP), if your PCP is located in our provider database this encounter information will be shared with them immediately following your visit.   A [facility] MyChart account gives you access to today's visit and all your visits, tests, and labs performed at [facility]  click here if you don't have a [facility] MyChart account or go to [email or web address]  Consent: (Patient) Tammie Martinez provided verbal consent for this virtual visit at the beginning of the encounter.  Current Medications:  Current Outpatient Medications:    acetaminophen  (TYLENOL ) 325 MG tablet, Take 2 tablets (650 mg total) by mouth every 4 (four) hours as needed (for pain scale < 4). (Patient not taking: Reported on 08/30/2022), Disp: 60 tablet, Rfl: 0   clindamycin  (CLEOCIN ) 300 MG capsule, Take 1 capsule (300 mg total) by mouth 2 (two) times daily for 7 days., Disp: 14 capsule, Rfl: 0   cyclobenzaprine  (FLEXERIL ) 10 MG tablet, Take 1 tablet (10 mg total) by mouth 3 (three) times daily as needed for muscle spasms., Disp: 30 tablet, Rfl: 0   fluconazole  (DIFLUCAN ) 150 MG tablet, Take 1 tablet PO once. Repeat in 3 days if needed., Disp: 2 tablet, Rfl: 0   ibuprofen  (ADVIL ) 600 MG tablet, Take 1 tablet (600 mg total) by mouth every 6 (six) hours. (Patient not taking: Reported on 08/15/2023), Disp: 30 tablet, Rfl: 0   ibuprofen  (ADVIL ) 800 MG tablet, Take 1 tablet (800 mg total) by mouth every 8 (eight) hours as needed. (Patient not taking: Reported on 08/15/2023), Disp: 30 tablet, Rfl: 5   ibuprofen  (ADVIL ) 800 MG tablet, Take 1 tablet (800 mg total) by mouth every 8 (eight) hours as needed. (Patient not taking: Reported on 08/15/2023), Disp: 30 tablet, Rfl: 5   metroNIDAZOLE  (FLAGYL ) 500 MG tablet, Take 1 tablet (500 mg total) by mouth 2 (two) times daily., Disp: 14 tablet,  Rfl: 0   naproxen (NAPROSYN) 500 MG tablet, Take 1 tablet (500 mg total) by mouth 2 (two) times daily with a meal for 15 days., Disp: 30 tablet, Rfl: 0   norethindrone  (MICRONOR ) 0.35 MG tablet, Take 1 tablet (0.35 mg total) by mouth daily. (Patient not taking: Reported on 08/15/2023), Disp: 28 tablet, Rfl: 11   Prenat-Fe Poly-Methfol-FA-DHA (VITAFOL  ULTRA) 29-0.6-0.4-200 MG CAPS, Take 1 capsule by mouth daily., Disp: 30 capsule, Rfl: 11   Medications ordered in this encounter:  No orders of the defined types were placed in this encounter.    *If you need refills on other medications prior to your next appointment, please contact your pharmacy*  Follow-Up: Call back or seek an in-person evaluation if the symptoms worsen or if the condition fails to improve as anticipated.  [facility] [phone]  Other Instructions Acute Back Pain, Adult Acute back pain is sudden and usually short-lived. It is often caused by an injury to the muscles and tissues in the back. The injury may result from: A muscle, tendon, or ligament getting overstretched or torn. Ligaments are tissues that connect bones to each other. Lifting something improperly can cause a back strain. Wear and tear (degeneration) of the spinal disks. Spinal disks are circular tissue that provide cushioning between the bones of the spine (vertebrae). Twisting motions, such as while playing sports or doing yard work. A hit to the back. Arthritis. You may have a physical exam, lab  tests, and imaging tests to find the cause of your pain. Acute back pain usually goes away with rest and home care. Follow these instructions at home: Managing pain, stiffness, and swelling Take over-the-counter and prescription medicines only as told by your health care provider. Treatment may include medicines for pain and inflammation that are taken by mouth or applied to the skin, or muscle relaxants. Your health care provider may recommend  applying ice during the first 24-48 hours after your pain starts. To do this: Put ice in a plastic bag. Place a towel between your skin and the bag. Leave the ice on for 20 minutes, 2-3 times a day. Remove the ice if your skin turns bright red. This is very important. If you cannot feel pain, heat, or cold, you have a greater risk of damage to the area. If directed, apply heat to the affected area as often as told by your health care provider. Use the heat source that your health care provider recommends, such as a moist heat pack or a heating pad. Place a towel between your skin and the heat source. Leave the heat on for 20-30 minutes. Remove the heat if your skin turns bright red. This is especially important if you are unable to feel pain, heat, or cold. You have a greater risk of getting burned. Activity  Do not stay in bed. Staying in bed for more than 1-2 days can delay your recovery. Sit up and stand up straight. Avoid leaning forward when you sit or hunching over when you stand. If you work at a desk, sit close to it so you do not need to lean over. Keep your chin tucked in. Keep your neck drawn back, and keep your elbows bent at a 90-degree angle (right angle). Sit high and close to the steering wheel when you drive. Add lower back (lumbar) support to your car seat, if needed. Take short walks on even surfaces as soon as you are able. Try to increase the length of time you walk each day. Do not sit, drive, or stand in one place for more than 30 minutes at a time. Sitting or standing for long periods of time can put stress on your back. Do not drive or use heavy machinery while taking prescription pain medicine. Use proper lifting techniques. When you bend and lift, use positions that put less stress on your back: [place] your knees. Keep the load close to your body. Avoid twisting. Exercise regularly as told by your health care provider. Exercising helps your back heal faster and helps  prevent back injuries by keeping muscles strong and flexible. Work with a physical therapist to make a safe exercise program, as recommended by your health care provider. Do any exercises as told by your physical therapist. Lifestyle Maintain a healthy weight. Extra weight puts stress on your back and makes it difficult to have good posture. Avoid activities or situations that make you feel anxious or stressed. Stress and anxiety increase muscle tension and can make back pain worse. Learn ways to manage anxiety and stress, such as through exercise. General instructions Sleep on a firm mattress in a comfortable position. Try lying on your side with your knees slightly bent. If you lie on your back, put a pillow under your knees. Keep your head and neck in a straight line with your spine (neutral position) when using electronic equipment like smartphones or pads. To do this: Raise your smartphone or pad to look at it instead of  bending your head or neck to look down. Put the smartphone or pad at the level of your face while looking at the screen. Follow your treatment plan as told by your health care provider. This may include: Cognitive or behavioral therapy. Acupuncture or massage therapy. Meditation or yoga. Contact a health care provider if: You have pain that is not relieved with rest or medicine. You have increasing pain going down into your legs or buttocks. Your pain does not improve after 2 weeks. You have pain at night. You lose weight without trying. You have a fever or chills. You develop nausea or vomiting. You develop abdominal pain. Get help right away if: You develop new bowel or bladder control problems. You have unusual weakness or numbness in your arms or legs. You feel faint. These symptoms may represent a serious problem that is an emergency. Do not wait to see if the symptoms will go away. Get medical help right away. Call your local emergency services (911 in the  [place] Do not drive yourself to the hospital. Summary Acute back pain is sudden and usually short-lived. Use proper lifting techniques. When you bend and lift, use positions that put less stress on your back. Take over-the-counter and prescription medicines only as told by your health care provider, and apply heat or ice as told. This information is not intended to replace advice given to you by your health care provider. Make sure you discuss any questions you have with your health care provider. Document Revised: 11/27/2020 Document Reviewed: 11/27/2020 Elsevier Patient Education  2024 [employer]   If you have been instructed to have an in-person evaluation today at a local Urgent Care facility, please use the link below. It will take you to a list of all of our available [facility], including address, phone number and hours of operation. Please do not delay care.  [facility]  If you or a family member do not have a primary care provider, use the link below to schedule a visit and establish care. When you choose a [facility] primary care physician or advanced practice provider, you gain a long-term partner in health. Find a Primary Care Provider  Learn more about [facility]'s in-office and virtual care options: [facility] - Get Care Now

## 2024-08-17 ENCOUNTER — Encounter: Payer: Self-pay | Admitting: Obstetrics

## 2024-08-17 ENCOUNTER — Telehealth: Admitting: Family Medicine

## 2024-08-17 ENCOUNTER — Telehealth: Admitting: Nurse Practitioner

## 2024-08-17 DIAGNOSIS — R112 Nausea with vomiting, unspecified: Secondary | ICD-10-CM | POA: Diagnosis not present

## 2024-08-17 DIAGNOSIS — R197 Diarrhea, unspecified: Secondary | ICD-10-CM

## 2024-08-17 MED ORDER — ONDANSETRON HCL 4 MG PO TABS: 4.0000 mg | ORAL_TABLET | Freq: Three times a day (TID) | ORAL | 0 refills | Status: AC | PRN

## 2024-08-17 NOTE — Progress Notes (Signed)
 See earlier visit today. Note placed in her visit. DWB

## 2024-08-17 NOTE — Progress Notes (Signed)
 Pt did not show for visit DWB

## 2024-08-17 NOTE — Progress Notes (Signed)
 We are sorry that you are not feeling well. Here is how we plan to help!  Based on what you have shared with me it looks like you have a Virus that is irritating your GI tract.  Vomiting is the forceful emptying of a portion of the stomach's content through the mouth.  Although nausea and vomiting can make you feel miserable, it's important to remember that these are not diseases, but rather symptoms of an underlying illness.  When we treat short term symptoms, we always caution that any symptoms that persist should be fully evaluated in a medical office.  I have prescribed a medication that will help alleviate your symptoms and allow you to stay hydrated:  Zofran  4 mg 1 tablet every 8 hours as needed for nausea and vomiting  HOME CARE: Drink clear liquids.  This is very important! Dehydration (the lack of fluid) can lead to a serious complication.  Start off with 1 tablespoon every 5 minutes for 8 hours. You may begin eating bland foods after 8 hours without vomiting.  Start with saltine crackers, white bread, rice, mashed potatoes, applesauce. After 48 hours on a bland diet, you may resume a normal diet. Try to go to sleep.  Sleep often empties the stomach and relieves the need to vomit.  GET HELP RIGHT AWAY IF:  Your symptoms do not improve or worsen within 2 days after treatment. You have a fever for over 3 days. You cannot keep down fluids after trying the medication.  MAKE SURE YOU:  Understand these instructions. Will watch your condition. Will get help right away if you are not doing well or get worse.   Thank you for choosing an e-visit. Your e-visit answers were reviewed by a board certified advanced clinical practitioner to complete your personal care plan. Depending upon the condition, your plan could have included both over the counter or prescription medications. Please review your pharmacy choice. Be sure that the pharmacy you have chosen is open so that you can pick up  your prescription now.  If there is a problem you may message your provider in MyChart to have the prescription routed to another pharmacy. Your safety is important to us . If you have drug allergies check your prescription carefully.  For the next 24 hours, you can use MyChart to ask questions about today's visit, request a non-urgent call back, or ask for a work or school excuse from your e-visit provider. You will get an e-mail in the next two days asking about your experience. I hope that your e-visit has been valuable and will speed your recovery.  I have spent 5 minutes in review of e-visit questionnaire, review and updating patient chart, medical decision making and response to patient.   Jawanda Passey W Brelynn Wheller, NP

## 2024-09-13 ENCOUNTER — Telehealth: Admitting: Family Medicine

## 2024-09-13 DIAGNOSIS — B9689 Other specified bacterial agents as the cause of diseases classified elsewhere: Secondary | ICD-10-CM

## 2024-09-13 DIAGNOSIS — N760 Acute vaginitis: Secondary | ICD-10-CM | POA: Diagnosis not present

## 2024-09-13 MED ORDER — METRONIDAZOLE 500 MG PO TABS: 500.0000 mg | ORAL_TABLET | Freq: Three times a day (TID) | ORAL | 0 refills | Status: AC

## 2024-09-13 NOTE — Progress Notes (Signed)
 We are sorry that you are not feeling well. Here is how we plan to help! Based on what you shared with me it looks like you: May have a vaginosis due to bacteria  Vaginosis is an inflammation of the vagina that can result in discharge, itching and pain. The cause is usually a change in the normal balance of vaginal bacteria or an infection. Vaginosis can also result from reduced estrogen levels after menopause.  The most common causes of vaginosis are:   Bacterial vaginosis which results from an overgrowth of one on several organisms that are normally present in your vagina.   Yeast infections which are caused by a naturally occurring fungus called candida.   Vaginal atrophy (atrophic vaginosis) which results from the thinning of the vagina from reduced estrogen levels after menopause.   Trichomoniasis which is caused by a parasite and is commonly transmitted by sexual intercourse.  Factors that increase your risk of developing vaginosis include: Medications, such as antibiotics and steroids Uncontrolled diabetes Use of hygiene products such as bubble bath, vaginal spray or vaginal deodorant Douching Wearing damp or tight-fitting clothing Using an intrauterine device (IUD) for birth control Hormonal changes, such as those associated with pregnancy, birth control pills or menopause Sexual activity Having a sexually transmitted infection  Your treatment plan is Metronidazole  or Flagyl  500mg  twice a day for 7 days.  I have electronically sent this prescription into the pharmacy that you have chosen. Note for work was also sent to your MyChart.   Be sure to take all of the medication as directed. Stop taking any medication if you develop a rash, tongue swelling or shortness of breath. Mothers who are breast feeding should consider pumping and discarding their breast milk while on these antibiotics. However, there is no consensus that infant exposure at these doses would be harmful.  Remember  that medication creams can weaken latex condoms.   HOME CARE:  Good hygiene may prevent some types of vaginosis from recurring and may relieve some symptoms:  Avoid baths, hot tubs and whirlpool spas. Rinse soap from your outer genital area after a shower, and dry the area well to prevent irritation. Don't use scented or harsh soaps, such as those with deodorant or antibacterial action. Avoid irritants. These include scented tampons and pads. Wipe from front to back after using the toilet. Doing so avoids spreading fecal bacteria to your vagina.  Other things that may help prevent vaginosis include:  Don't douche. Your vagina doesn't require cleansing other than normal bathing. Repetitive douching disrupts the normal organisms that reside in the vagina and can actually increase your risk of vaginal infection. Douching won't clear up a vaginal infection. Use a latex condom. Both female and female latex condoms may help you avoid infections spread by sexual contact. Wear cotton underwear. Also wear pantyhose with a cotton crotch. If you feel comfortable without it, skip wearing underwear to bed. Yeast thrives in [place] Your symptoms should improve in the next day or two.  GET HELP RIGHT AWAY IF:  You have pain in your lower abdomen ( pelvic area or over your ovaries) You develop nausea or vomiting You develop a fever Your discharge changes or worsens You have persistent pain with intercourse You develop shortness of breath, a rapid pulse, or you faint.  These symptoms could be signs of problems or infections that need to be evaluated by a medical provider now.  MAKE SURE YOU   Understand these instructions. Will watch your condition.  Will get help right away if you are not doing well or get worse.  Your e-visit answers were reviewed by a board certified advanced clinical practitioner to complete your personal care plan. Depending upon the condition, your plan could have  included both over the counter or prescription medications. Please review your pharmacy choice to make sure that you have choses a pharmacy that is open for you to pick up any needed prescription, Your safety is important to us . If you have drug allergies check your prescription carefully.   You can use MyChart to ask questions about todays visit, request a non-urgent call back, or ask for a work or school excuse for 24 hours related to this e-Visit. If it has been greater than 24 hours you will need to follow up with your provider, or enter a new e-Visit to address those concerns. You will get a MyChart message within the next two days asking about your experience. I hope that your e-visit has been valuable and will speed your recovery.  I have spent 5 minutes in review of e-visit questionnaire, review and updating patient chart, medical decision making and response to patient.   Roosvelt Mater, PA-C

## 2024-10-04 ENCOUNTER — Telehealth

## 2024-10-04 ENCOUNTER — Encounter: Payer: Self-pay | Admitting: Physician Assistant

## 2024-10-05 ENCOUNTER — Ambulatory Visit (HOSPITAL_COMMUNITY)
Admission: EM | Admit: 2024-10-05 | Discharge: 2024-10-05 | Disposition: A | Discharge status: Home / Self Care | Source: Intra-hospital | Attending: Psychiatry | Admitting: Psychiatry

## 2024-10-05 DIAGNOSIS — F4310 Post-traumatic stress disorder, unspecified: Secondary | ICD-10-CM | POA: Insufficient documentation

## 2024-10-05 DIAGNOSIS — F4322 Adjustment disorder with anxiety: Secondary | ICD-10-CM | POA: Insufficient documentation

## 2024-10-05 DIAGNOSIS — F16983 Hallucinogen use, unspecified with hallucinogen persisting perception disorder (flashbacks): Secondary | ICD-10-CM | POA: Insufficient documentation

## 2024-10-05 DIAGNOSIS — G479 Sleep disorder, unspecified: Secondary | ICD-10-CM | POA: Insufficient documentation

## 2024-10-05 MED ORDER — HYDROXYZINE HCL 25 MG PO TABS: 25.0000 mg | ORAL_TABLET | Freq: Three times a day (TID) | ORAL | 0 refills | Status: AC | PRN

## 2024-10-05 NOTE — Discharge Instructions (Addendum)
 You are encouraged to follow up with [facility] for outpatient treatment.  Walk in/ Open Access Hours: Monday - Friday 8AM - 10AM (To see provider and therapist) Please arrive by 7:15AM on the second floor  [facility] [address] [place], [place] [phone]   Discharge recommendations:   Medications: Patient is to take medications as prescribed. The patient or patient's guardian is to contact a medical professional and/or outpatient provider to address any new side effects that develop. The patient or the patient's guardian should update outpatient providers of any new medications and/or medication changes.    Outpatient Follow up: Please review list of outpatient resources for psychiatry and counseling. Please follow up with your primary care provider for all medical related needs.    Therapy: We recommend that patient participate in individual therapy to address mental health concerns.   Safety:   The following safety precautions should be taken:   No sharp objects. This includes scissors, razors, scrapers, and putty knives.   Chemicals should be removed and locked up.   Medications should be removed and locked up.   Weapons should be removed and locked up. This includes firearms, knives and instruments that can be used to cause injury.   The patient should abstain from use of illicit substances/drugs and abuse of any medications.  If symptoms worsen or do not continue to improve or if the patient becomes actively suicidal or homicidal then it is recommended that the patient return to the closest hospital emergency department, the [facility], or call 911 for further evaluation and treatment. National Suicide Prevention Lifeline 1-800-SUICIDE or 3064970725.  About 988 988 offers 24/7 access to trained crisis counselors who can help people experiencing mental health-related distress. People can  call or text 988 or chat 988lifeline.org for themselves or if they are worried about a loved one who may need crisis support.
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
## 2024-10-10 ENCOUNTER — Telehealth: Admitting: Family Medicine

## 2024-10-10 DIAGNOSIS — T3695XA Adverse effect of unspecified systemic antibiotic, initial encounter: Secondary | ICD-10-CM

## 2024-10-10 DIAGNOSIS — N760 Acute vaginitis: Secondary | ICD-10-CM | POA: Diagnosis not present

## 2024-10-10 DIAGNOSIS — B3731 Acute candidiasis of vulva and vagina: Secondary | ICD-10-CM

## 2024-10-10 DIAGNOSIS — B379 Candidiasis, unspecified: Secondary | ICD-10-CM

## 2024-10-10 DIAGNOSIS — B9689 Other specified bacterial agents as the cause of diseases classified elsewhere: Secondary | ICD-10-CM

## 2024-10-10 MED ORDER — FLUCONAZOLE 150 MG PO TABS: ORAL_TABLET | ORAL | 0 refills | Status: AC

## 2024-10-10 MED ORDER — METRONIDAZOLE 500 MG PO TABS: 500.0000 mg | ORAL_TABLET | Freq: Two times a day (BID) | ORAL | 0 refills | Status: AC

## 2024-10-10 NOTE — Progress Notes (Signed)
 SABRA

## 2024-10-10 NOTE — Progress Notes (Signed)
 We are sorry that you are not feeling well. Here is how we plan to help! Based on what you shared with me it looks like you: May have a vaginosis due to bacteria  Vaginosis is an inflammation of the vagina that can result in discharge, itching and pain. The cause is usually a change in the normal balance of vaginal bacteria or an infection. Vaginosis can also result from reduced estrogen levels after menopause.  The most common causes of vaginosis are:   Bacterial vaginosis which results from an overgrowth of one on several organisms that are normally present in your vagina.   Yeast infections which are caused by a naturally occurring fungus called candida.   Vaginal atrophy (atrophic vaginosis) which results from the thinning of the vagina from reduced estrogen levels after menopause.   Trichomoniasis which is caused by a parasite and is commonly transmitted by sexual intercourse.  Factors that increase your risk of developing vaginosis include: Medications, such as antibiotics and steroids Uncontrolled diabetes Use of hygiene products such as bubble bath, vaginal spray or vaginal deodorant Douching Wearing damp or tight-fitting clothing Using an intrauterine device (IUD) for birth control Hormonal changes, such as those associated with pregnancy, birth control pills or menopause Sexual activity Having a sexually transmitted infection  Your treatment plan is Metronidazole  or Flagyl  500mg  twice a day for 7 days.  I have electronically sent this prescription into the pharmacy that you have chosen. I will also send diflucan .  Be sure to take all of the medication as directed. Stop taking any medication if you develop a rash, tongue swelling or shortness of breath. Mothers who are breast feeding should consider pumping and discarding their breast milk while on these antibiotics. However, there is no consensus that infant exposure at these doses would be harmful.  Remember that medication  creams can weaken latex condoms.   HOME CARE:  Good hygiene may prevent some types of vaginosis from recurring and may relieve some symptoms:  Avoid baths, hot tubs and whirlpool spas. Rinse soap from your outer genital area after a shower, and dry the area well to prevent irritation. Don't use scented or harsh soaps, such as those with deodorant or antibacterial action. Avoid irritants. These include scented tampons and pads. Wipe from front to back after using the toilet. Doing so avoids spreading fecal bacteria to your vagina.  Other things that may help prevent vaginosis include:  Don't douche. Your vagina doesn't require cleansing other than normal bathing. Repetitive douching disrupts the normal organisms that reside in the vagina and can actually increase your risk of vaginal infection. Douching won't clear up a vaginal infection. Use a latex condom. Both female and female latex condoms may help you avoid infections spread by sexual contact. Wear cotton underwear. Also wear pantyhose with a cotton crotch. If you feel comfortable without it, skip wearing underwear to bed. Yeast thrives in [place] Your symptoms should improve in the next day or two.  GET HELP RIGHT AWAY IF:  You have pain in your lower abdomen ( pelvic area or over your ovaries) You develop nausea or vomiting You develop a fever Your discharge changes or worsens You have persistent pain with intercourse You develop shortness of breath, a rapid pulse, or you faint.  These symptoms could be signs of problems or infections that need to be evaluated by a medical provider now.  MAKE SURE YOU   Understand these instructions. Will watch your condition. Will get help right away  if you are not doing well or get worse.  Your e-visit answers were reviewed by a board certified advanced clinical practitioner to complete your personal care plan. Depending upon the condition, your plan could have included both over  the counter or prescription medications. Please review your pharmacy choice to make sure that you have choses a pharmacy that is open for you to pick up any needed prescription, Your safety is important to us . If you have drug allergies check your prescription carefully.   You can use MyChart to ask questions about today's visit, request a non-urgent call back, or ask for a work or school excuse for 24 hours related to this e-Visit. If it has been greater than 24 hours you will need to follow up with your provider, or enter a new e-Visit to address those concerns. You will get a MyChart message within the next two days asking about your experience. I hope that your e-visit has been valuable and will speed your recovery.  I have spent 5 minutes in review of e-visit questionnaire, review and updating patient chart, medical decision making and response to patient.   Sephira Zellman, FNP
